# Patient Record
Sex: Male | Born: 1955 | Race: Black or African American | Hispanic: No | Smoking: Former smoker
Health system: Southern US, Community
[De-identification: ages and names within clinical notes are randomized; demographics above are authoritative.]

## PROBLEM LIST (undated history)

## (undated) DIAGNOSIS — D869 Sarcoidosis, unspecified: Secondary | ICD-10-CM

## (undated) DIAGNOSIS — G4730 Sleep apnea, unspecified: Secondary | ICD-10-CM

## (undated) DIAGNOSIS — K219 Gastro-esophageal reflux disease without esophagitis: Secondary | ICD-10-CM

## (undated) DIAGNOSIS — R0602 Shortness of breath: Secondary | ICD-10-CM

## (undated) DIAGNOSIS — N390 Urinary tract infection, site not specified: Secondary | ICD-10-CM

## (undated) DIAGNOSIS — F32A Depression, unspecified: Secondary | ICD-10-CM

## (undated) DIAGNOSIS — Z933 Colostomy status: Secondary | ICD-10-CM

## (undated) DIAGNOSIS — I10 Essential (primary) hypertension: Secondary | ICD-10-CM

## (undated) DIAGNOSIS — N200 Calculus of kidney: Secondary | ICD-10-CM

## (undated) DIAGNOSIS — F329 Major depressive disorder, single episode, unspecified: Secondary | ICD-10-CM

## (undated) DIAGNOSIS — E785 Hyperlipidemia, unspecified: Secondary | ICD-10-CM

## (undated) DIAGNOSIS — N319 Neuromuscular dysfunction of bladder, unspecified: Secondary | ICD-10-CM

## (undated) HISTORY — DX: Urinary tract infection, site not specified: N39.0

## (undated) HISTORY — PX: SPINE SURGERY: SHX786

## (undated) HISTORY — PX: COLOSTOMY: SHX63

---

## 1998-02-19 ENCOUNTER — Other Ambulatory Visit: Admission: RE | Admit: 1998-02-19 | Discharge: 1998-02-19 | Payer: Self-pay | Admitting: Family Medicine

## 1999-01-19 ENCOUNTER — Emergency Department (HOSPITAL_COMMUNITY): Admission: EM | Admit: 1999-01-19 | Discharge: 1999-01-19 | Payer: Self-pay | Admitting: Emergency Medicine

## 1999-11-22 ENCOUNTER — Encounter: Admission: RE | Admit: 1999-11-22 | Discharge: 1999-11-22 | Payer: Self-pay | Admitting: Gastroenterology

## 1999-11-22 ENCOUNTER — Encounter: Payer: Self-pay | Admitting: Gastroenterology

## 2000-02-12 ENCOUNTER — Ambulatory Visit (HOSPITAL_COMMUNITY): Admission: RE | Admit: 2000-02-12 | Discharge: 2000-02-12 | Payer: Self-pay | Admitting: *Deleted

## 2000-04-28 ENCOUNTER — Encounter: Payer: Self-pay | Admitting: Urology

## 2000-04-28 ENCOUNTER — Encounter: Admission: RE | Admit: 2000-04-28 | Discharge: 2000-04-28 | Payer: Self-pay | Admitting: Urology

## 2000-11-17 ENCOUNTER — Ambulatory Visit (HOSPITAL_COMMUNITY): Admission: RE | Admit: 2000-11-17 | Discharge: 2000-11-17 | Payer: Self-pay | Admitting: Gastroenterology

## 2001-03-16 ENCOUNTER — Encounter: Admission: RE | Admit: 2001-03-16 | Discharge: 2001-06-14 | Payer: Self-pay | Admitting: Orthopedic Surgery

## 2001-06-15 ENCOUNTER — Encounter: Admission: RE | Admit: 2001-06-15 | Discharge: 2001-09-06 | Payer: Self-pay | Admitting: Orthopedic Surgery

## 2001-11-27 ENCOUNTER — Encounter: Admission: RE | Admit: 2001-11-27 | Discharge: 2001-12-13 | Payer: Self-pay | Admitting: Internal Medicine

## 2001-11-30 ENCOUNTER — Emergency Department (HOSPITAL_COMMUNITY): Admission: EM | Admit: 2001-11-30 | Discharge: 2001-11-30 | Payer: Self-pay | Admitting: Emergency Medicine

## 2002-06-23 ENCOUNTER — Encounter (HOSPITAL_BASED_OUTPATIENT_CLINIC_OR_DEPARTMENT_OTHER): Admission: RE | Admit: 2002-06-23 | Discharge: 2002-06-30 | Payer: Self-pay | Admitting: Internal Medicine

## 2002-08-22 ENCOUNTER — Ambulatory Visit (HOSPITAL_COMMUNITY): Admission: RE | Admit: 2002-08-22 | Discharge: 2002-08-22 | Payer: Self-pay | Admitting: Orthopedic Surgery

## 2002-08-29 ENCOUNTER — Encounter: Admission: RE | Admit: 2002-08-29 | Discharge: 2002-08-29 | Payer: Self-pay | Admitting: Orthopedic Surgery

## 2002-08-29 ENCOUNTER — Encounter: Payer: Self-pay | Admitting: Orthopedic Surgery

## 2003-04-13 ENCOUNTER — Encounter: Payer: Self-pay | Admitting: Emergency Medicine

## 2003-04-13 ENCOUNTER — Emergency Department (HOSPITAL_COMMUNITY): Admission: EM | Admit: 2003-04-13 | Discharge: 2003-04-13 | Payer: Self-pay | Admitting: Emergency Medicine

## 2003-05-17 ENCOUNTER — Encounter: Payer: Self-pay | Admitting: Internal Medicine

## 2003-05-17 ENCOUNTER — Encounter: Admission: RE | Admit: 2003-05-17 | Discharge: 2003-05-17 | Payer: Self-pay | Admitting: Internal Medicine

## 2003-05-22 ENCOUNTER — Ambulatory Visit (HOSPITAL_COMMUNITY): Admission: RE | Admit: 2003-05-22 | Discharge: 2003-05-22 | Payer: Self-pay | Admitting: Internal Medicine

## 2003-05-22 ENCOUNTER — Encounter: Payer: Self-pay | Admitting: Internal Medicine

## 2003-07-06 ENCOUNTER — Encounter: Payer: Self-pay | Admitting: Orthopedic Surgery

## 2003-07-07 ENCOUNTER — Inpatient Hospital Stay (HOSPITAL_COMMUNITY): Admission: RE | Admit: 2003-07-07 | Discharge: 2003-07-08 | Payer: Self-pay | Admitting: Orthopedic Surgery

## 2003-07-18 ENCOUNTER — Encounter: Admission: RE | Admit: 2003-07-18 | Discharge: 2003-08-22 | Payer: Self-pay | Admitting: Orthopedic Surgery

## 2003-10-24 ENCOUNTER — Encounter: Admission: RE | Admit: 2003-10-24 | Discharge: 2003-10-24 | Payer: Self-pay | Admitting: Orthopedic Surgery

## 2003-11-14 ENCOUNTER — Encounter: Admission: RE | Admit: 2003-11-14 | Discharge: 2004-01-03 | Payer: Self-pay | Admitting: Orthopedic Surgery

## 2004-12-30 ENCOUNTER — Ambulatory Visit: Payer: Self-pay | Admitting: Family Medicine

## 2005-01-07 ENCOUNTER — Ambulatory Visit: Payer: Self-pay | Admitting: Family Medicine

## 2005-02-07 ENCOUNTER — Emergency Department (HOSPITAL_COMMUNITY): Admission: EM | Admit: 2005-02-07 | Discharge: 2005-02-08 | Payer: Self-pay | Admitting: Emergency Medicine

## 2005-02-11 ENCOUNTER — Ambulatory Visit: Payer: Self-pay | Admitting: Family Medicine

## 2005-02-20 ENCOUNTER — Ambulatory Visit (HOSPITAL_COMMUNITY): Admission: RE | Admit: 2005-02-20 | Discharge: 2005-02-20 | Payer: Self-pay | Admitting: Urology

## 2005-02-20 ENCOUNTER — Ambulatory Visit (HOSPITAL_BASED_OUTPATIENT_CLINIC_OR_DEPARTMENT_OTHER): Admission: RE | Admit: 2005-02-20 | Discharge: 2005-02-20 | Payer: Self-pay | Admitting: Urology

## 2005-03-05 ENCOUNTER — Encounter: Admission: RE | Admit: 2005-03-05 | Discharge: 2005-03-05 | Payer: Self-pay | Admitting: Sports Medicine

## 2005-03-20 ENCOUNTER — Ambulatory Visit: Payer: Self-pay | Admitting: Family Medicine

## 2005-10-20 ENCOUNTER — Ambulatory Visit: Payer: Self-pay | Admitting: Family Medicine

## 2005-11-06 ENCOUNTER — Ambulatory Visit: Payer: Self-pay | Admitting: Family Medicine

## 2005-11-11 ENCOUNTER — Ambulatory Visit: Payer: Self-pay | Admitting: Internal Medicine

## 2006-01-06 ENCOUNTER — Ambulatory Visit: Payer: Self-pay | Admitting: Family Medicine

## 2006-01-08 ENCOUNTER — Ambulatory Visit: Payer: Self-pay | Admitting: Family Medicine

## 2006-01-20 ENCOUNTER — Ambulatory Visit (HOSPITAL_COMMUNITY): Admission: RE | Admit: 2006-01-20 | Discharge: 2006-01-20 | Payer: Self-pay | Admitting: Internal Medicine

## 2006-02-02 ENCOUNTER — Emergency Department (HOSPITAL_COMMUNITY): Admission: EM | Admit: 2006-02-02 | Discharge: 2006-02-02 | Payer: Self-pay | Admitting: Emergency Medicine

## 2006-04-10 ENCOUNTER — Ambulatory Visit: Payer: Self-pay | Admitting: Internal Medicine

## 2007-04-12 ENCOUNTER — Ambulatory Visit: Payer: Self-pay | Admitting: Internal Medicine

## 2007-04-26 ENCOUNTER — Emergency Department (HOSPITAL_COMMUNITY): Admission: EM | Admit: 2007-04-26 | Discharge: 2007-04-26 | Payer: Self-pay | Admitting: Emergency Medicine

## 2007-05-10 ENCOUNTER — Ambulatory Visit (HOSPITAL_COMMUNITY): Admission: RE | Admit: 2007-05-10 | Discharge: 2007-05-10 | Payer: Self-pay | Admitting: Internal Medicine

## 2007-05-10 ENCOUNTER — Ambulatory Visit: Payer: Self-pay | Admitting: Internal Medicine

## 2007-06-29 ENCOUNTER — Ambulatory Visit: Payer: Self-pay | Admitting: Internal Medicine

## 2007-06-29 LAB — CONVERTED CEMR LAB
ALT: 29 units/L (ref 0–53)
AST: 20 units/L (ref 0–37)
BUN: 28 mg/dL — ABNORMAL HIGH (ref 6–23)
CO2: 20 meq/L (ref 19–32)
Cholesterol: 202 mg/dL — ABNORMAL HIGH (ref 0–200)
Creatinine, Ser: 1.16 mg/dL (ref 0.40–1.50)
HDL: 38 mg/dL — ABNORMAL LOW (ref >39)
Sodium: 140 meq/L (ref 135–145)
Total Bilirubin: 0.6 mg/dL (ref 0.3–1.2)

## 2007-07-14 ENCOUNTER — Encounter: Admission: RE | Admit: 2007-07-14 | Discharge: 2007-07-14 | Payer: Self-pay | Admitting: Orthopedic Surgery

## 2008-11-03 HISTORY — PX: TOE AMPUTATION: SHX809

## 2009-04-23 ENCOUNTER — Encounter: Admission: RE | Admit: 2009-04-23 | Discharge: 2009-04-23 | Payer: Self-pay | Admitting: Family Medicine

## 2009-08-25 ENCOUNTER — Emergency Department (HOSPITAL_COMMUNITY): Admission: EM | Admit: 2009-08-25 | Discharge: 2009-08-25 | Payer: Self-pay | Admitting: Emergency Medicine

## 2009-09-24 ENCOUNTER — Emergency Department (HOSPITAL_COMMUNITY): Admission: EM | Admit: 2009-09-24 | Discharge: 2009-09-24 | Payer: Self-pay | Admitting: Emergency Medicine

## 2009-12-07 ENCOUNTER — Ambulatory Visit (HOSPITAL_BASED_OUTPATIENT_CLINIC_OR_DEPARTMENT_OTHER): Admission: RE | Admit: 2009-12-07 | Discharge: 2009-12-07 | Payer: Self-pay

## 2010-05-23 ENCOUNTER — Encounter: Admission: RE | Admit: 2010-05-23 | Discharge: 2010-05-23 | Payer: Self-pay | Admitting: Orthopedic Surgery

## 2010-06-28 ENCOUNTER — Inpatient Hospital Stay (HOSPITAL_COMMUNITY): Admission: RE | Admit: 2010-06-28 | Discharge: 2010-06-29 | Payer: Self-pay | Admitting: Orthopedic Surgery

## 2010-08-01 ENCOUNTER — Encounter
Admission: RE | Admit: 2010-08-01 | Discharge: 2010-09-11 | Payer: Self-pay | Source: Home / Self Care | Admitting: Orthopedic Surgery

## 2010-08-10 ENCOUNTER — Emergency Department (HOSPITAL_COMMUNITY): Admission: EM | Admit: 2010-08-10 | Discharge: 2010-08-10 | Payer: Self-pay | Admitting: Emergency Medicine

## 2011-01-16 LAB — COMPREHENSIVE METABOLIC PANEL
Albumin: 3.2 g/dL — ABNORMAL LOW (ref 3.5–5.2)
Calcium: 9.1 mg/dL (ref 8.4–10.5)
Chloride: 108 mEq/L (ref 96–112)
Creatinine, Ser: 1.01 mg/dL (ref 0.4–1.5)
GFR calc Af Amer: >60 mL/min (ref >60)
Glucose, Bld: 97 mg/dL (ref 70–99)
Potassium: 4.1 mEq/L (ref 3.5–5.1)
Sodium: 139 mEq/L (ref 135–145)
Total Protein: 6.5 g/dL (ref 6.0–8.3)

## 2011-01-16 LAB — CBC
MCV: 92.4 fL (ref 78.0–100.0)
Platelets: 146 10*3/uL — ABNORMAL LOW (ref 150–400)
WBC: 4.4 10*3/uL (ref 4.0–10.5)

## 2011-01-21 NOTE — Discharge Summary (Signed)
  [name], [name]  [name]          PATIENT TYPE:  LOCATION:                                 FACILITY:  PHYSICIAN:  Patrick Orozco, M.D.    DATE OF BIRTH:  1956/08/06  DATE OF ADMISSION:  06/28/2010 DATE OF DISCHARGE:  06/29/2010                              DISCHARGE SUMMARY   DISCHARGE DIAGNOSIS:  Partial rotator cuff tear.  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  Arthroscopic debridement of partial rotator cuff tear and debridement of intraarticular glenohumeral arthritis and lysis of adhesions.  DISCHARGE SUMMARY:  The patient is a 55 year old with a history of intractable right shoulder pain, possible rotator cuff tear that required surgical intervention.  Because of medical history, the patient was thought best served to be operated on at the [facility] here at [facility].  Preoperative history and physical showed the patient to be in relatively good health with a history of contrast dye, hypertension and asthma.  Preoperative labs were within normal limits with a hemoglobin of 14, hematocrit of 41 and he was found to be a good candidate for surgery.  On the date of his admission, the patient was taken to the operating room where he underwent right shoulder arthroscopic debridement of intraarticular glenohumeral arthritis, lysis of adhesions, and inspection of partial rotator cuff tear.  The patient was placed on perioperative antibiotics.  He was placed on postoperative pain control using both oral and IM medication.  He was admitted for overnight observation to monitor his oxygenation as he has had some difficulties with keeping his saturations up while in PACU.  He was also prescribed the medications as needed for nausea and physical therapy was begun to teach him range of motion, pendulum-type exercises.  On the following  morning, the patient was found to be progressing well, had passed his physical therapy instruction.  He had no complaints and he had good saturation levels on his oxygenation by monitor.  He was able to transfer independently in to his power wheelchair and felt he was ready to go home.  He was discharged home under the care of his family.  DISCHARGE MEDICATIONS:  He will continue his ProAir albuterol inhaler, his Toprol-XL 50 mg tablets, his Zocor tablets and was given prescriptions for Percocet 1-2 q.4 h. p.r.n. pain, Robaxin 500 mg 1 p.o. q.8. p.r.n. spasm, and Mobic 7.5 mg one p.o. daily.  ACTIVITIES:  At the time of discharge, continue with range of motion exercises for the right shoulder, discontinue use of sling after 1-2 additional days.  He will return to see Dr. Madelon Lips in one week's time, calling the office sooner if he has temperature increased to greater than 101 degrees.  He should keep his dressing dry and change as necessary, and keeping the incision covered.     Patrick Orozco. Patrick Orozco   ______________________________ Patrick Orozco, M.D.    JBR/MEDQ  D:  01/07/2011  T:  01/07/2011  Job:  [id number]  Electronically Signed by W. Jiyah Torpey M.D. on 01/21/2011 11:10:18 AM

## 2011-01-22 LAB — BASIC METABOLIC PANEL
CO2: 24 mEq/L (ref 19–32)
Chloride: 107 mEq/L (ref 96–112)
GFR calc Af Amer: >60 mL/min (ref >60)
GFR calc non Af Amer: >60 mL/min (ref >60)
Glucose, Bld: 118 mg/dL — ABNORMAL HIGH (ref 70–99)
Potassium: 4.4 mEq/L (ref 3.5–5.1)
Sodium: 138 mEq/L (ref 135–145)

## 2011-02-22 ENCOUNTER — Emergency Department (HOSPITAL_COMMUNITY): Payer: Medicare Other

## 2011-02-22 ENCOUNTER — Emergency Department (HOSPITAL_COMMUNITY)
Admission: EM | Admit: 2011-02-22 | Discharge: 2011-02-22 | Disposition: A | Discharge status: Home / Self Care | Payer: Medicare Other | Attending: Emergency Medicine | Admitting: Emergency Medicine

## 2011-02-22 DIAGNOSIS — E7800 Pure hypercholesterolemia, unspecified: Secondary | ICD-10-CM | POA: Insufficient documentation

## 2011-02-22 DIAGNOSIS — Z79899 Other long term (current) drug therapy: Secondary | ICD-10-CM | POA: Insufficient documentation

## 2011-02-22 DIAGNOSIS — Q059 Spina bifida, unspecified: Secondary | ICD-10-CM | POA: Insufficient documentation

## 2011-02-22 DIAGNOSIS — Y92009 Unspecified place in unspecified non-institutional (private) residence as the place of occurrence of the external cause: Secondary | ICD-10-CM | POA: Insufficient documentation

## 2011-02-22 DIAGNOSIS — Y93E1 Activity, personal bathing and showering: Secondary | ICD-10-CM | POA: Insufficient documentation

## 2011-02-22 DIAGNOSIS — I10 Essential (primary) hypertension: Secondary | ICD-10-CM | POA: Insufficient documentation

## 2011-02-22 DIAGNOSIS — S92919A Unspecified fracture of unspecified toe(s), initial encounter for closed fracture: Secondary | ICD-10-CM | POA: Insufficient documentation

## 2011-02-22 DIAGNOSIS — E669 Obesity, unspecified: Secondary | ICD-10-CM | POA: Insufficient documentation

## 2011-02-22 DIAGNOSIS — W268XXA Contact with other sharp object(s), not elsewhere classified, initial encounter: Secondary | ICD-10-CM | POA: Insufficient documentation

## 2011-02-22 DIAGNOSIS — G8220 Paraplegia, unspecified: Secondary | ICD-10-CM | POA: Insufficient documentation

## 2011-02-22 DIAGNOSIS — S91109A Unspecified open wound of unspecified toe(s) without damage to nail, initial encounter: Secondary | ICD-10-CM | POA: Insufficient documentation

## 2011-02-22 DIAGNOSIS — Z9889 Other specified postprocedural states: Secondary | ICD-10-CM | POA: Insufficient documentation

## 2011-03-21 NOTE — Op Note (Signed)
   NAME:  NELTON, AMSDEN NO.:  [id number]   MEDICAL RECORD NO.:  [id number]                   PATIENT TYPE:  INP   LOCATION:  5024                                 FACILITY:  [facility]   PHYSICIAN:  Thera Flake., M.D.             DATE OF BIRTH:  06-02-1956   DATE OF PROCEDURE:  07/07/2003  DATE OF DISCHARGE:                                 OPERATIVE REPORT   PREOPERATIVE DIAGNOSES:  1. Impingement with bursitis, partial cuff tear.  2. Labral tear.   POSTOPERATIVE DIAGNOSES:  1. Impingement with bursitis, partial cuff tear.  2. Labral tera.   PROCEDURE:  1. Acromioplasty with rotator cuff debridement.  2. Debridement of torn labrum.   SURGEON:  Dyke Brackett, M.D.   ANESTHESIA:  General.   INDICATIONS:  This is a 55 year old paraplegic with recurrent impingement,  partial cuff tears, having had several injections of the right shoulder not  responding to conservative treatment, thought to be amenable to outpatient  arthroscopy.   DESCRIPTION OF PROCEDURE:  Arthroscope through a posterior lateral and  anterior portal.  Systematic inspection of the shoulder showed the patient  to have no degenerative change, really in the joint, but degenerative tear  in the anterior and inferior superior labrum.  Biceps anchor tendon intact.  Aggressive debridement of the labrum was carried out.  Partial cuff tear,  probably 30-40% of the thickness of the rotator cuff supraspinatus over its  anterior one-half which was debrided.  Nothing approaching.  A full-  thickness tear was noted.  Debridement of the intra-articular labral tear  was carried out separate.  The bursae itself despite having previously had  resection years ago showed an extreme amount of hypertrophy, some thick  bands, scarred in, hypertrophic bursae, aggressive bursectomy carried out a  revision and re-acromioplasty done to basically bump the bone although there  was no significant  overhanging bone.  The bone was freshened with the bur.  Complete bursectomies carried out.  Very impressive the amount of bursal  hypertrophy and scarring in the bursae, and again this was completely  removed relieving the impingement and the bursitis nicely.  Superior surface  of the cuff showed nothing approaching the full thickness tear.  The [facility]  joint was not prominently involved in any disease process.  It was not  violated.  The shoulder drained free of fluid.  Portals closed with lightly  compressive sterile dressing and sling applied. Taken to the recovery room  in stable condition.                                               Thera Flake., M.D.    WDC/MEDQ  D:  07/07/2003  T:  07/08/2003  Job:  [id number]

## 2011-03-21 NOTE — H&P (Signed)
NAME:  Patrick Orozco, Patrick Orozco NO.:  [id number]   MEDICAL RECORD NO.:  [id number]                   PATIENT TYPE:  INP   LOCATION:  5024                                 FACILITY:  [facility]   PHYSICIAN:  Dyke Brackett, M.D.                 DATE OF BIRTH:  [birth year]   DATE OF ADMISSION:  07/07/2003  DATE OF DISCHARGE:                                HISTORY & PHYSICAL   CHIEF COMPLAINT:  Right shoulder pain.   HISTORY OF PRESENT ILLNESS:  This 55 year old paraplegic patient presented  to Dr. Madelon Lips with a history of recurrent impingement and partial rotator  cuff tears in the past on the right shoulder.  He had several injections of  cortisone in the right shoulder, and this has not relieved his pain.  Because of failure to respond to conservative treatment he is being admitted  for observation, status post right shoulder arthroscopy.   ALLERGIES:  IVP DYE.   CURRENT MEDICATIONS:  1. Toprol 1 tablet p.o. daily, last dose July 04, 2003.  2. Kaopectate p.r.n.   PAST MEDICAL HISTORY:  1. Spina bifida.  2. Paraplegia.  3. History of cardiac arrhythmia.  4. 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  5. Urinary incontinence, treated with condom catheter.   SURGICAL PROCEDURES:  1. Multiple surgeries on his foot, hips, and legs in the 1960s due to spina     bifida.  2. Left shoulder arthroscopy in 1998.  3. Right shoulder arthroscopy in 1993.   SOCIAL HISTORY:  He denies any drug use or alcohol use.  He quit smoking 10  years ago.  He lives with a friend at home.   FAMILY HISTORY:  Noncontributory.   REVIEW OF SYSTEMS:  He does have a history of cardiac arrhythmia.  Complains  of some bloating and gas and pain with every meal.  He does use a condom  catheter.  He has had the diarrhea since the Toprol.  He does have some  urinary incontinence at times.  He is a paraplegic.  He does wear glasses  always.  He does have difficulty sleeping because  of his right shoulder  pain.  He has snoring, and he wakes up and has to take some deep breaths at  times.   PHYSICAL EXAM:  GENERAL:  Well-developed, well-nourished male in no acute  distress.  He is a paraplegic.  VITAL SIGNS:  Temperature 97 degrees Fahrenheit, pulse 77, respirations 18,  and BP 154/107 in the right and 161/88 in the left.  HEENT:  Within normal limits.  NECK:  Supple.  Without masses or lesions.  CARDIOVASCULAR:  Heart rate and rhythm regular.  S1, S2 present.  RESPIRATORY:  Respirations even and unlabored.  Clear to auscultation  bilaterally.  ABDOMEN:  Colostomy in place.  Bowel sounds present.  GENITOURINARY:  Condom catheter in place.  MUSCULOSKELETAL:  He has good range  of motion of his left shoulder.  Complains of right shoulder aching in the biceps region.  He has decreased  range of motion of that shoulder.  Pain is worse with raising his arm up  over his head.  Arm otherwise neurovascularly intact.  Cannot move his lower  extremities.  NEUROLOGIC:  Alert and oriented x3.  Paraplegic with his lower extremities.   IMPRESSION:  1. Recurrent impingement syndrome right shoulder with probable partial cuff     tear.  2. Paraplegia.  3. Spina bifida.  4. Urinary incontinence.  5. Diarrhea due to medications with colostomy.  6. History of cardiac arrhythmia.   PLAN:  Mr. Lolli is being admitted for observation status post right  shoulder arthroscopy.  He will undergo all the routine preoperative  laboratory tests and studies prior to this procedure.      Legrand Pitts Marshall Cork, M.D.    KED/MEDQ  D:  07/28/2003  T:  07/29/2003  Job:  [id number]

## 2011-03-21 NOTE — Consult Note (Signed)
[facility]  Patient:    Patrick Orozco, Patrick Orozco                     MRN: [id number] Adm. Date:  [id number] Attending:  Nadara Mustard                          Consultation Report  PHYSICIAN REQUESTING CONSULTATION:  Dr. Stacie Acres. White at [employer] [facility] [employer].  CHIEF COMPLAINT:  Nonhealing left heel ulcer.  HISTORY OF PRESENT ILLNESS:  The patient is a 55 year old black male with several medical issues, who has noted a left heel ulcer for six to eight months.  The ulcer has gradually increased in size and has had intermittent bleeding.  He has not had fever or chills associated with the ulcer.  He has tried treating the ulcer with elevating his legs and using topical antibiotics without improvement.  PAST MEDICAL HISTORY:  His past medical history is significant for a spinal bifida resulting in bilateral numbness of the lower extremities from the knees down.  He uses knee/foot orthotics bilaterally and is able to ambulate somewhat with the help of crutches.  He also has a history of hypertension, gastroesophageal reflux disease, and hyperlipidemia.  PAST SURGICAL HISTORY:  Past surgical history is significant for several operations in the 1960s to correct foot deformities on both feet and in the 1960s, he had bilateral hip operations for uncertain reasons.  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  He has a history of colostomy.  MEDICATIONS: 1. Zocor 20 mg p.o. q.d. 2. Labetalol 200 mg p.o. b.i.d. 3. Nexium 40 mg p.o. q.d.  ALLERGIES:  No known drug allergies.  PHYSICAL EXAMINATION:  EXTREMITIES:  Both feet have well-healed surgical scars oriented medially and lengthwise on both feet and overlying the Achilles tendons.  There is moderate eversion of the left ankle.  Both feet have normal temperature. There is 1+ nonpitting edema of both feet and the pedal pulses  are nonpalpable; however, the pulses were easily obtained via Doppler ultrasound interrogation in both the dorsalis pedis and posterior tibial arteries bilaterally.  On the plantar and medial aspect of the left heel, there is an approximately 1.5 x 2.5-cm stage 1 neuropathic ulcer with a large amount of surrounding necrotic material and callus.  There is mild tinea pedis affecting multiple interspaces of both feet.  Both feet are insensate to light touch.  IMPRESSION: 1. Neuropathic ulcer, left foot, Wagner stage 1. 2. 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 3. Tinea pedis bilaterally. 4. Hypertension. 5. Gastroesophageal reflux disease. 6. Hyperlipidemia.  PLAN: 1. The left heel ulcer was debrided with a #10 scalpel without difficulty down    to viable tissue.  The left lower extremity was placed in a total contact    cast to reduce pressure on the ulcer. 2. Lamisil cream was applied between the toes of both feet and will be done    once daily between the toes of the right foot. 3. The patient will return in approximately one week for reevaluation of the    foot ulcer. 4. He was seen by a pedorthotist who plans on making custom extra-depth shoes    which his ulcer has healed. DD:  03/16/01 TD:  03/17/01 Job: [id number]

## 2011-03-21 NOTE — Procedures (Signed)
[facility]  Patient:    Patrick Orozco, Patrick Orozco                     MRN: [id number] Proc. Date: 11/17/00 Adm. Date:  [id number] Attending:  Orland Mustard CC:         Dr. Beverley Fiedler, [facility]             Lucrezia Starch. Ovidio Hanger, M.D.                           Procedure Report  DATE OF BIRTH:  [birth year].  PROCEDURE:  Esophagogastroduodenoscopy.  MEDICATIONS:  Cetacaine spray, fentanyl 75 mcg, Versed 6 mg IV.  INDICATIONS:  Recent hematemesis.  DESCRIPTION OF PROCEDURE:  The procedure had been explained to the patient and consent obtained.  With the patient in the left lateral decubitus position, the Olympus video endoscope was inserted blindly into the esophagus and advanced under direct visualization.  The stomach was entered, pylorus identified and passed.  The duodenum, including the bulb and the second portion, was seen well and the second portion was normal with no ulceration or inflammation.  There was no evidence of outlet obstruction or ulcers.  The pyloric channel was normal.  The antrum was seen well and was normal.  The greater curve did have a fairly large quantity of semisolid, gelatinous liquid in spite of the overnight fast.  This was irrigated and sucked clean and the body carefully examined, and there was no ulceration seen.  The fundus and cardia were seen in the retroflex view.  There was a hiatal hernia with a widely patent GE junction and markedly reddened distal esophagus.  The proximal esophagus was more endoscopically normal.  The scope was withdrawn. The patient tolerated the procedure well, was maintained on low-flow oxygen and pulse oximeter throughout the procedure with no obvious abnormalities.  ASSESSMENT: 1. Hiatal hernia with gastroesophageal reflux disease. 2. Probable gastroparesis.  PLAN:  Will continue Nexium, add Reglan to his medical regimen.  Will give the patient a reflux sheet and see back  in the office in two months. DD:  11/17/00 TD:  11/17/00 Job: [id number]

## 2011-03-21 NOTE — Op Note (Signed)
[name], [name] NO.:  [id number]   MEDICAL RECORD NO.:  [id number]          PATIENT TYPE:  AMB   LOCATION:  DAY                          FACILITY:  [facility]   PHYSICIAN:  Ronald L. Earlene Plater, M.D.  DATE OF BIRTH:  January 29, 1956   DATE OF PROCEDURE:  02/20/2005  DATE OF DISCHARGE:                                 OPERATIVE REPORT   DIAGNOSIS:  Left ureterolithiasis with hydronephrosis.   OPERATIVE PROCEDURE:  1.  Cystourethroscopy, left retrograde ureteropyelogram.  2.  Placement of left double-J stent.  3.  Preparation with ESWL.   SURGEON:  Gaynelle Arabian, M.D.   ANESTHESIA:  General endotracheal.   BLOOD LOSS:  Minimal.   TUBES:  A 26 cm 6 French Cook double-pigtail stent.   COMPLICATIONS:  None.   INDICATIONS FOR PROCEDURE:  Patrick Orozco is a very nice 55 year old black  male with paraplegia secondary to spina bifida.  He had presented to [facility] Emergency Room with left-sided flank pain.  He had been found to have  pneumonia and a kidney stone.  CT scan in the office revealed a left  hydronephrosis with an 11 mm obstructing stone at the left ureteropelvic  junction.  He also had a 17 mm stone in the lower pole of the left kidney.  After understanding risks, benefits, and alternatives, elected to proceed  with cysto, placement of left double-J stent, and left SWL.   PROCEDURE IN DETAIL:  The patient was placed in a supine position.  After  proper general endotracheal anesthesia, was placed in the dorsolithotomy  position and prepped and draped with Betadine in a sterile fashion after his  condom catheter had been removed.  Cystourethroscopy was performed with a  22.5 French Olympus panendoscope, utilizing the 12 and 70-degree lens.  There was no significant prostatic enlargement noted.  The bladder was  actually smooth wall.  Efflux of clear urine was noted from the area of the  right ureteral orifice, although it was hard to identify, but the left  ureteral orifice was easily identifiable.  A retrograde ureteral pyelogram  was performed with a 6 [place] open-ended catheter, and there was noted to be  a highly obstructed 1.1 cm stone at the level of L3 on the left with a  proximal hydronephrosis.  Under fluoroscopic guidance, a 0.038 [place]  Glidewire was placed past the stone, and a 6 [place] open-ended catheter was  placed into the kidney, and a hydronephrotic drip was noted.  The wire was  changed to a 0.038 [place] Teflon-coated guidewire and under fluoroscopic  guidance, a 26  cm 6 French Cook double-pigtail stent was placed and noted to be in good  position within the left renal pelvis within the bladder.  The patient  tolerated the procedure well with no complications.  He was taken to the  recovery room stable.      RLD/MEDQ  D:  02/20/2005  T:  02/20/2005  Job:  [id number]

## 2011-08-20 LAB — CBC
HCT: 47.8
MCV: 90.4
RBC: 5.29

## 2011-08-20 LAB — COMPREHENSIVE METABOLIC PANEL
ALT: 25
BUN: 16
Calcium: 9.2
Creatinine, Ser: 1.18
GFR calc Af Amer: >60
GFR calc non Af Amer: >60
Glucose, Bld: 101 — ABNORMAL HIGH
Total Bilirubin: 0.5
Total Protein: 7.9

## 2011-08-20 LAB — DIFFERENTIAL
Basophils Relative: 0
Eosinophils Absolute: 0.2
Monocytes Absolute: 0.6
Monocytes Relative: 8

## 2011-08-20 LAB — CULTURE, BLOOD (ROUTINE X 2)

## 2011-08-20 LAB — B-NATRIURETIC PEPTIDE (CONVERTED LAB): Pro B Natriuretic peptide (BNP): <30

## 2011-09-27 ENCOUNTER — Encounter: Payer: Self-pay | Admitting: *Deleted

## 2011-09-27 ENCOUNTER — Emergency Department (HOSPITAL_COMMUNITY)
Admission: EM | Admit: 2011-09-27 | Discharge: 2011-09-27 | Disposition: A | Discharge status: Home / Self Care | Payer: Medicare Other | Attending: Emergency Medicine | Admitting: Emergency Medicine

## 2011-09-27 DIAGNOSIS — J209 Acute bronchitis, unspecified: Secondary | ICD-10-CM | POA: Insufficient documentation

## 2011-09-27 DIAGNOSIS — Q059 Spina bifida, unspecified: Secondary | ICD-10-CM | POA: Insufficient documentation

## 2011-09-27 DIAGNOSIS — R062 Wheezing: Secondary | ICD-10-CM | POA: Insufficient documentation

## 2011-09-27 DIAGNOSIS — R079 Chest pain, unspecified: Secondary | ICD-10-CM | POA: Insufficient documentation

## 2011-09-27 DIAGNOSIS — Z9889 Other specified postprocedural states: Secondary | ICD-10-CM | POA: Insufficient documentation

## 2011-09-27 MED ORDER — IPRATROPIUM BROMIDE 0.02 % IN SOLN
0.5000 mg | Freq: Once | RESPIRATORY_TRACT | Status: AC
  Administered 2011-09-27: 0.5 mg via RESPIRATORY_TRACT
  Filled 2011-09-27: qty 2.5

## 2011-09-27 MED ORDER — ALBUTEROL SULFATE HFA 108 (90 BASE) MCG/ACT IN AERS
INHALATION_SPRAY | RESPIRATORY_TRACT | Status: AC
  Filled 2011-09-27: qty 6.7

## 2011-09-27 MED ORDER — PREDNISONE 20 MG PO TABS
60.0000 mg | ORAL_TABLET | Freq: Once | ORAL | Status: AC
  Administered 2011-09-27: 60 mg via ORAL
  Filled 2011-09-27: qty 3

## 2011-09-27 MED ORDER — AZITHROMYCIN 250 MG PO TABS
500.0000 mg | ORAL_TABLET | Freq: Once | ORAL | Status: AC
  Administered 2011-09-27: 500 mg via ORAL
  Filled 2011-09-27: qty 2

## 2011-09-27 MED ORDER — PREDNISONE 20 MG PO TABS: 60.0000 mg | ORAL_TABLET | Freq: Every day | ORAL | Status: AC

## 2011-09-27 MED ORDER — AZITHROMYCIN 250 MG PO TABS: 250.0000 mg | ORAL_TABLET | Freq: Every day | ORAL | Status: AC

## 2011-09-27 MED ORDER — ALBUTEROL SULFATE (5 MG/ML) 0.5% IN NEBU
5.0000 mg | INHALATION_SOLUTION | Freq: Once | RESPIRATORY_TRACT | Status: AC
  Administered 2011-09-27: 5 mg via RESPIRATORY_TRACT
  Filled 2011-09-27: qty 1

## 2011-09-27 MED ORDER — ALBUTEROL SULFATE HFA 108 (90 BASE) MCG/ACT IN AERS
2.0000 | INHALATION_SPRAY | Freq: Once | RESPIRATORY_TRACT | Status: AC
  Administered 2011-09-27: 2 via RESPIRATORY_TRACT
  Filled 2011-09-27: qty 6.7

## 2011-09-27 NOTE — ED Provider Notes (Signed)
Medical screening examination/treatment/procedure(s) were performed by non-physician practitioner and as supervising physician I was immediately available for consultation/collaboration.   Geoffery Lyons, MD 09/27/11 1538

## 2011-09-27 NOTE — ED Provider Notes (Signed)
History     CSN: [id number] Arrival date & time: 09/27/2011 10:24 AM   First MD Initiated Contact with Patient 09/27/11 1052      Chief Complaint  Patient presents with  . URI    (Consider location/radiation/quality/duration/timing/severity/associated sxs/prior treatment) Patient is a 55 y.o. male presenting with URI. The history is provided by the patient.  URI The primary symptoms include cough and wheezing. Primary symptoms do not include fever, sore throat, nausea or vomiting. The current episode started 3 to 5 days ago. This is a new problem. The problem has been gradually improving.  Symptoms associated with the illness include sinus pressure, congestion and rhinorrhea. The illness is not associated with chills.    Past Medical History  Diagnosis Date  . Spina bifida     Past Surgical History  Procedure Date  . Spine surgery     multiple surgeries related to spina bifida    No family history on file.  History  Substance Use Topics  . Smoking status: Never Smoker   . Smokeless tobacco: Not on file  . Alcohol Use: No      Review of Systems  Constitutional: Negative for fever and chills.  HENT: Positive for congestion, rhinorrhea and sinus pressure. Negative for sore throat.   Eyes: Negative.   Respiratory: Positive for cough and wheezing. Negative for shortness of breath.   Cardiovascular: Negative.        Chest pain with cough.  Gastrointestinal: Negative.  Negative for nausea and vomiting.  Musculoskeletal: Negative.   Skin: Negative.   Neurological: Negative.     Allergies  Ivp dye  Home Medications  No current outpatient prescriptions on file.  BP 135/74  Pulse 72  Temp(Src) 98.1 F (36.7 C) (Oral)  Resp 18  SpO2 97%  Physical Exam  Constitutional: He appears well-developed and well-nourished.  HENT:  Head: Normocephalic.  Mouth/Throat: Oropharynx is clear and moist.  Neck: Normal range of motion. Neck supple.  Cardiovascular: Normal  rate and regular rhythm.   Pulmonary/Chest: Effort normal. No respiratory distress. He has wheezes. He has no rales. He exhibits no tenderness.  Abdominal: Soft. Bowel sounds are normal. There is no tenderness. There is no rebound and no guarding.  Musculoskeletal: Normal range of motion.  Neurological: He is alert. No cranial nerve deficit.  Skin: Skin is warm and dry. No rash noted.  Psychiatric: He has a normal mood and affect.    ED Course  Procedures (including critical care time)  Labs Reviewed - No data to display No results found.   No diagnosis found.    MDM  He feel improved after nebulizer treatment. He reports feeling comfortable with discharge.         Rodena Medin, PA 09/27/11 1317

## 2011-09-27 NOTE — ED Notes (Signed)
Chest congestion and cold symptoms for weeks, feels like he is getting worse

## 2013-01-05 ENCOUNTER — Inpatient Hospital Stay (HOSPITAL_COMMUNITY)
Admission: EM | Admit: 2013-01-05 | Discharge: 2013-01-11 | DRG: 872 | Disposition: A | Discharge status: Home / Self Care | Payer: Medicare Other | Attending: Internal Medicine | Admitting: Internal Medicine

## 2013-01-05 ENCOUNTER — Emergency Department (HOSPITAL_COMMUNITY): Payer: Medicare Other

## 2013-01-05 ENCOUNTER — Encounter (HOSPITAL_COMMUNITY): Payer: Self-pay

## 2013-01-05 DIAGNOSIS — R0789 Other chest pain: Secondary | ICD-10-CM | POA: Diagnosis present

## 2013-01-05 DIAGNOSIS — D869 Sarcoidosis, unspecified: Secondary | ICD-10-CM | POA: Diagnosis present

## 2013-01-05 DIAGNOSIS — R748 Abnormal levels of other serum enzymes: Secondary | ICD-10-CM | POA: Diagnosis present

## 2013-01-05 DIAGNOSIS — Q059 Spina bifida, unspecified: Secondary | ICD-10-CM

## 2013-01-05 DIAGNOSIS — Z87728 Personal history of other specified (corrected) congenital malformations of nervous system and sense organs: Secondary | ICD-10-CM

## 2013-01-05 DIAGNOSIS — Z933 Colostomy status: Secondary | ICD-10-CM

## 2013-01-05 DIAGNOSIS — N200 Calculus of kidney: Secondary | ICD-10-CM | POA: Diagnosis present

## 2013-01-05 DIAGNOSIS — Z8669 Personal history of other diseases of the nervous system and sense organs: Secondary | ICD-10-CM

## 2013-01-05 DIAGNOSIS — E785 Hyperlipidemia, unspecified: Secondary | ICD-10-CM | POA: Diagnosis present

## 2013-01-05 DIAGNOSIS — I872 Venous insufficiency (chronic) (peripheral): Secondary | ICD-10-CM | POA: Diagnosis present

## 2013-01-05 DIAGNOSIS — R222 Localized swelling, mass and lump, trunk: Secondary | ICD-10-CM | POA: Diagnosis present

## 2013-01-05 DIAGNOSIS — A419 Sepsis, unspecified organism: Principal | ICD-10-CM | POA: Diagnosis present

## 2013-01-05 DIAGNOSIS — G8220 Paraplegia, unspecified: Secondary | ICD-10-CM | POA: Diagnosis present

## 2013-01-05 DIAGNOSIS — D72829 Elevated white blood cell count, unspecified: Secondary | ICD-10-CM | POA: Diagnosis present

## 2013-01-05 DIAGNOSIS — N390 Urinary tract infection, site not specified: Secondary | ICD-10-CM

## 2013-01-05 DIAGNOSIS — D696 Thrombocytopenia, unspecified: Secondary | ICD-10-CM | POA: Diagnosis present

## 2013-01-05 DIAGNOSIS — I2489 Other forms of acute ischemic heart disease: Secondary | ICD-10-CM | POA: Diagnosis present

## 2013-01-05 DIAGNOSIS — A499 Bacterial infection, unspecified: Secondary | ICD-10-CM | POA: Diagnosis present

## 2013-01-05 DIAGNOSIS — N10 Acute tubulo-interstitial nephritis: Secondary | ICD-10-CM | POA: Diagnosis present

## 2013-01-05 DIAGNOSIS — A498 Other bacterial infections of unspecified site: Secondary | ICD-10-CM | POA: Diagnosis present

## 2013-01-05 DIAGNOSIS — I10 Essential (primary) hypertension: Secondary | ICD-10-CM | POA: Diagnosis present

## 2013-01-05 DIAGNOSIS — I959 Hypotension, unspecified: Secondary | ICD-10-CM

## 2013-01-05 DIAGNOSIS — E7800 Pure hypercholesterolemia, unspecified: Secondary | ICD-10-CM | POA: Diagnosis present

## 2013-01-05 DIAGNOSIS — R531 Weakness: Secondary | ICD-10-CM

## 2013-01-05 HISTORY — DX: Calculus of kidney: N20.0

## 2013-01-05 HISTORY — DX: Hyperlipidemia, unspecified: E78.5

## 2013-01-05 HISTORY — DX: Essential (primary) hypertension: I10

## 2013-01-05 LAB — CBC WITH DIFFERENTIAL/PLATELET
Basophils Absolute: 0 10*3/uL (ref 0.0–0.1)
Basophils Relative: 0 % (ref 0–1)
Eosinophils Absolute: 0 10*3/uL (ref 0.0–0.7)
Eosinophils Relative: 0 % (ref 0–5)
HCT: 44.2 % (ref 39.0–52.0)
Hemoglobin: 15 g/dL (ref 13.0–17.0)
Lymphocytes Relative: 3 % — ABNORMAL LOW (ref 12–46)
Lymphs Abs: 0.3 10*3/uL — ABNORMAL LOW (ref 0.7–4.0)
MCH: 30.7 pg (ref 26.0–34.0)
MCHC: 33.9 g/dL (ref 30.0–36.0)
MCV: 90.4 fL (ref 78.0–100.0)
Monocytes Absolute: 0.1 10*3/uL (ref 0.1–1.0)
Monocytes Relative: 1 % — ABNORMAL LOW (ref 3–12)
Neutro Abs: 8.1 10*3/uL — ABNORMAL HIGH (ref 1.7–7.7)
Neutrophils Relative %: 95 % — ABNORMAL HIGH (ref 43–77)
Platelets: 125 10*3/uL — ABNORMAL LOW (ref 150–400)
RBC: 4.89 MIL/uL (ref 4.22–5.81)
RDW: 13.8 % (ref 11.5–15.5)
WBC: 8.5 10*3/uL (ref 4.0–10.5)

## 2013-01-05 LAB — BASIC METABOLIC PANEL
BUN: 20 mg/dL (ref 6–23)
CO2: 17 mEq/L — ABNORMAL LOW (ref 19–32)
Calcium: 9 mg/dL (ref 8.4–10.5)
Chloride: 108 mEq/L (ref 96–112)
Creatinine, Ser: 1.17 mg/dL (ref 0.50–1.35)
GFR calc Af Amer: 78 mL/min — ABNORMAL LOW (ref >90)
GFR calc non Af Amer: 68 mL/min — ABNORMAL LOW (ref >90)
Glucose, Bld: 106 mg/dL — ABNORMAL HIGH (ref 70–99)
Potassium: 4.1 mEq/L (ref 3.5–5.1)
Sodium: 141 mEq/L (ref 135–145)

## 2013-01-05 LAB — URINE MICROSCOPIC-ADD ON

## 2013-01-05 LAB — URINALYSIS, ROUTINE W REFLEX MICROSCOPIC
Bilirubin Urine: NEGATIVE
Glucose, UA: NEGATIVE mg/dL
Ketones, ur: NEGATIVE mg/dL
Nitrite: NEGATIVE
Protein, ur: NEGATIVE mg/dL
Specific Gravity, Urine: 1.01 (ref 1.005–1.030)
Urobilinogen, UA: 0.2 mg/dL (ref 0.0–1.0)
pH: 5.5 (ref 5.0–8.0)

## 2013-01-05 MED ORDER — SIMVASTATIN 40 MG PO TABS
40.0000 mg | ORAL_TABLET | Freq: Every day | ORAL | Status: DC
  Administered 2013-01-05 – 2013-01-10 (×6): 40 mg via ORAL
  Filled 2013-01-05 (×9): qty 1

## 2013-01-05 MED ORDER — ACETAMINOPHEN 325 MG PO TABS
650.0000 mg | ORAL_TABLET | Freq: Once | ORAL | Status: AC
  Administered 2013-01-05: 650 mg via ORAL

## 2013-01-05 MED ORDER — ACETAMINOPHEN 325 MG PO TABS
650.0000 mg | ORAL_TABLET | Freq: Four times a day (QID) | ORAL | Status: DC | PRN
  Administered 2013-01-07 (×2): 650 mg via ORAL
  Filled 2013-01-05 (×2): qty 2

## 2013-01-05 MED ORDER — ONDANSETRON HCL 4 MG PO TABS: 4.0000 mg | ORAL_TABLET | Freq: Four times a day (QID) | ORAL | Status: DC | PRN

## 2013-01-05 MED ORDER — CEFTRIAXONE SODIUM 1 G IJ SOLR
1.0000 g | Freq: Once | INTRAMUSCULAR | Status: AC
  Administered 2013-01-05: 1 g via INTRAVENOUS
  Filled 2013-01-05: qty 10

## 2013-01-05 MED ORDER — CEFTRIAXONE SODIUM 1 G IJ SOLR
1.0000 g | INTRAMUSCULAR | Status: DC
  Administered 2013-01-06 – 2013-01-10 (×5): 1 g via INTRAVENOUS
  Filled 2013-01-05 (×8): qty 10

## 2013-01-05 MED ORDER — ONDANSETRON HCL 4 MG/2ML IJ SOLN
4.0000 mg | Freq: Four times a day (QID) | INTRAMUSCULAR | Status: DC | PRN
  Administered 2013-01-05 – 2013-01-10 (×2): 4 mg via INTRAVENOUS
  Filled 2013-01-05 (×2): qty 2

## 2013-01-05 MED ORDER — ACETAMINOPHEN 650 MG RE SUPP: 650.0000 mg | Freq: Four times a day (QID) | RECTAL | Status: DC | PRN

## 2013-01-05 MED ORDER — SODIUM CHLORIDE 0.9 % IV SOLN
INTRAVENOUS | Status: DC
  Administered 2013-01-05 – 2013-01-08 (×7): via INTRAVENOUS
  Administered 2013-01-08: 1000 mL via INTRAVENOUS
  Administered 2013-01-08 – 2013-01-11 (×6): via INTRAVENOUS

## 2013-01-05 MED ORDER — METOPROLOL SUCCINATE ER 50 MG PO TB24
50.0000 mg | ORAL_TABLET | Freq: Every day | ORAL | Status: DC
  Filled 2013-01-05: qty 1

## 2013-01-05 MED ORDER — ACETAMINOPHEN 325 MG PO TABS
ORAL_TABLET | ORAL | Status: AC
  Administered 2013-01-05: 650 mg via ORAL
  Filled 2013-01-05: qty 2

## 2013-01-05 MED ORDER — MORPHINE SULFATE 2 MG/ML IJ SOLN
2.0000 mg | INTRAMUSCULAR | Status: DC | PRN
  Administered 2013-01-05: 2 mg via INTRAVENOUS
  Filled 2013-01-05 (×2): qty 1

## 2013-01-05 MED ORDER — SODIUM CHLORIDE 0.9 % IV BOLUS (SEPSIS)
1000.0000 mL | Freq: Once | INTRAVENOUS | Status: AC
  Administered 2013-01-05: 1000 mL via INTRAVENOUS

## 2013-01-05 MED ORDER — HYDROCODONE-ACETAMINOPHEN 5-325 MG PO TABS
1.0000 | ORAL_TABLET | ORAL | Status: DC | PRN
  Filled 2013-01-05: qty 2

## 2013-01-05 NOTE — ED Notes (Signed)
Pt c/o chills, fever, generalized body aches and vomiting x 3 started this morning.  Denies diarrhea.    Sts he has been sick on and off since his wife passed away, last May 23, 2023.

## 2013-01-05 NOTE — ED Notes (Signed)
Attempted IV access x2.  Pt is obese and a difficult stick.  IV rn notified.

## 2013-01-05 NOTE — ED Notes (Signed)
PA at bedside.

## 2013-01-05 NOTE — ED Notes (Signed)
IV team at bedside 

## 2013-01-05 NOTE — ED Notes (Addendum)
Patient given CT contrast

## 2013-01-05 NOTE — ED Notes (Signed)
Per EMS, Pt, from home, complaining of flu-like symptoms (fever and chills) and nausea starting this morning.  Vitals are stable.  Fever 102.7.  Hx spina bifida.

## 2013-01-05 NOTE — H&P (Signed)
History and Physical  Patrick Orozco ZOX:[id number] DOB: Mar 20, 1956 DOA: 01/05/2013  Referring physician: Ebbie Ridge, PA-C PCP: No primary provider on file. [facility]  Chief Complaint: Vomiting  HPI:  57 year old man presented with acute onset of fever, nausea, vomiting, generalized abdominal pain and left flank pain today. Initial evaluation was notable for UTI and CT findings suggesting past kidney stone.  Patient reports symptoms began early this morning after taking a bath with shortness of breath followed by generalized abdominal pain followed by 3 episodes of vomiting, nonbloody. Pain was especially noted to be in the left upper quadrant, ribs, left flank, left lower quadrant but also somewhat generalized over the abdomen. No recent urinary symptoms. Remote history of nephrolithiasis. Fever at home. Because of the severity of his symptoms he came to the emergency department.  In ED temp 102.8. Initial SBP 97 but repeat 143. Screening labs notable for CO2 17, plts 125, positive U/A. CXR negative. CT abdomen and pelvis revealed left pelvicaliectasis and hydroureter without obstructing  calculus. Could represent the sequela of a passed calculus. Given ceftriaxone.  Review of Systems:  Negative for fever, sore throat, congestion, rhinorrhea, rash, new muscle aches, chest pain, dysuria, bleeding.  Positive for minimal cough, some blurry vision lately, some shortness of breath earlier today. Uses a condom catheter and leg bag at home  Past Medical History  Diagnosis Date  . Spina bifida   . Hypertension   . Hyperlipidemia     Past Surgical History  Procedure Laterality Date  . Spine surgery      multiple surgeries related to spina bifida    Social History:  reports that he has never smoked. He does not have any smokeless tobacco history on file. He reports that he does not drink alcohol or use illicit drugs.  Allergies  Allergen Reactions  . Ivp Dye (Iodinated  Diagnostic Agents) Itching    No family history on file. No particular medical problems in the family. Mother does smoke.  Prior to Admission medications   Medication Sig Start Date End Date Taking? Authorizing Provider  metoprolol (TOPROL-XL) 50 MG 24 hr tablet Take 50 mg by mouth daily.     Yes Historical Provider, MD  simvastatin (ZOCOR) 40 MG tablet Take 40 mg by mouth at bedtime.     Yes Historical Provider, MD   Physical Exam: Filed Vitals:   01/05/13 1319 01/05/13 1329 01/05/13 1525 01/05/13 1700  BP:  97/47  143/78  Pulse:  113  78  Temp:  102.8 F (39.3 C) 98.6 F (37 C) 98.8 F (37.1 C)  TempSrc:  Oral Oral   Resp:  26  22  SpO2: 98% 100%  99%    General:  Examined in the emergency department. Appears calm and comfortable. Eyes: PERRL, normal lids, irises. Wears glasses. ENT: grossly normal hearing, lips & tongue Neck: no LAD, masses or thyromegaly Cardiovascular: RRR, no m/r/g. 3+ bilateral lower extremity edema left greater than right. Respiratory: CTA bilaterally, no w/r/r. Normal respiratory effort. Abdomen: soft, ntnd. Colostomy left lower quadrant. No rebound or guarding. Reports some left flank pain and left upper quadrant pain. Skin: no rash or induration seen on limited exam Musculoskeletal: Shortened lower limbs. Psychiatric: grossly normal mood and affect, speech fluent and appropriate, smiles, injects levity into situation Neurologic: grossly non-focal.  Wt Readings from Last 3 Encounters:  No data found for Wt    Labs on Admission:  Basic Metabolic Panel:  Recent Labs Lab 01/05/13 1505  NA  141  K 4.1  CL 108  CO2 17*  GLUCOSE 106*  BUN 20  CREATININE 1.17  CALCIUM 9.0    CBC:  Recent Labs Lab 01/05/13 1505  WBC 8.5  NEUTROABS 8.1*  HGB 15.0  HCT 44.2  MCV 90.4  PLT 125*     Radiological Exams on Admission: Ct Abdomen Pelvis Wo Contrast  01/05/2013  *RADIOLOGY REPORT*  Clinical Data: Abdominal pain and fever  CT ABDOMEN AND  PELVIS WITHOUT CONTRAST  Technique:  Multidetector CT imaging of the abdomen and pelvis was performed following the standard protocol without intravenous contrast.  Comparison: The CT abdomen 04/10/2005  Findings: There is a rounded than 3 cm mass within the junction of the right middle lobe and right upper lobe (image #1).  This is not completely imaged.  No pericardial fluid.  There is no focal hepatic lesion on this noncontrast exam.  The gallbladder, pancreas, spleen, and adrenal glands are normal.  There is a large calcification in the left renal hilum measuring 18 mm.  There is a 9 mm dependent calcification within the left renal pelvis.  There is mild dilatation of the left renal pelvis is increased compared to prior.  No evidence of obstructing calculi in the course of the left ureter which is mildly dilated.  Right kidney demonstrates a low density lesion in the upper pole which likely represents simple cyst (image 32).  This measures 25 mm and cannot be fully characterized without IV contrast.  No evidence of right ureterolithiasis.  The stomach, small bowel, appendix, and cecum are normal.  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  No evidence obstruction of the bowel.  Abdominal aorta normal caliber.  No retroperitoneal periportal lymphadenopathy.  The prostate gland bladder are normal.  There is no pelvic lymphadenopathy.  Rectal pouch appears normal.  There is severe degenerative changes of the hips again noted.  No aggressive osseous lesions  IMPRESSION: . 1.  Left pelvicaliectasis and hydroureter without obstructing calculus is identified.  This could represent the sequela of a passed calculus. 2. Large  calculi within the left renal pelvis and the left kidney. 3.  Low density lesion in the right kidney likely represents a simple cyst but cannot be fully characterized. 4.  Left lower quadrant colostomy without complication. 5.  There is a 3 cm mass within the right upper lobe  centrally. This is not fully characterized on this exam.  Recommend CT thorax with contrast for further evaluation.  Differential includes benign or  malignant neoplasm or metastasis.   Original Report Authenticated By: Genevive Bi, M.D.    Dg Chest 2 View  01/05/2013  *RADIOLOGY REPORT*  Clinical Data: Cough, fever.  CHEST - 2 VIEW  Comparison: June 24, 2010.  Findings: Cardiomediastinal silhouette appears normal.  No acute pulmonary disease is noted.  Bony thorax is intact.  IMPRESSION: No acute cardiopulmonary abnormality seen.   Original Report Authenticated By: Lupita Raider.,  M.D.     Principal Problem:   Acute pyelonephritis Active Problems:   Nausea and vomiting   Left flank pain   Thrombocytopenia   Hypertension   History of spina bifida   Assessment/Plan 1. Nausea, vomiting, left flank pain: Antiemetics, pain control. 2. Acute pyelonephritis with possible early sepsis: Vital signs stable now and appears clinically well. Empiric antibiotics. Followup urine culture. Suspect low serum CO2 secondary to GI loss. 3. Left pelvicaliectasis and hydroureter without obstructing  calculus: Consider followup with urology as an outpatient.  4. Thrombocytopenia: Followup CBC in the morning. PAF hose. Hold Lovenox until stable. 5. 3 cm mass within the right upper lobe centrally: Significance unclear. Remote smoking history. Consider followup scan as outpatient. 6. Low density lesion in the right kidney likely represents a simple cyst but cannot be fully characterized: Consider followup as an outpatient. 7. HTN: Stable. Continue Toprol-XL. 8. H/o spina bifida  Code Status: Full code Family Communication: None present Disposition Plan/Anticipated LOS: Admit inpatient, 2-4 days.  Time spent: 55 minutes  Brendia Sacks, MD  [facility] Pager [phone] 01/05/2013, 6:32 PM

## 2013-01-05 NOTE — ED Notes (Signed)
Patient transported to CT 

## 2013-01-05 NOTE — ED Provider Notes (Signed)
History     CSN: [id number]  Arrival date & time 01/05/13  1308   First MD Initiated Contact with Patient 01/05/13 1313      Chief Complaint  Patient presents with  . flu symptoms     (Consider location/radiation/quality/duration/timing/severity/associated sxs/prior treatment) HPI Patient presents to the emergency department with nausea, vomiting, flank pain, and fever.  Patient, states the symptoms began late last night, early this morning.  Patient denies chest pain, shortness of breath, headache, visual changes, syncope, dizziness, blood in his stool or back pain.  Patient, states, that he's feeling very weak, as well.  Patient did not take any medications prior to arrival for his symptoms.  Patient denies anything makes his condition, better or worse. Past Medical History  Diagnosis Date  . Spina bifida   . Hypertension   . Hyperlipidemia     Past Surgical History  Procedure Laterality Date  . Spine surgery      multiple surgeries related to spina bifida    No family history on file.  History  Substance Use Topics  . Smoking status: Never Smoker   . Smokeless tobacco: Not on file  . Alcohol Use: No      Review of Systems All other systems negative except as documented in the HPI. All pertinent positives and negatives as reviewed in the HPI. Allergies  Ivp dye  Home Medications   Current Outpatient Rx  Name  Route  Sig  Dispense  Refill  . metoprolol (TOPROL-XL) 50 MG 24 hr tablet   Oral   Take 50 mg by mouth daily.           . simvastatin (ZOCOR) 40 MG tablet   Oral   Take 40 mg by mouth at bedtime.             BP 143/78  Pulse 78  Temp(Src) 98.8 F (37.1 C) (Oral)  Resp 22  SpO2 99%  Physical Exam  Nursing note and vitals reviewed. Constitutional: He is oriented to person, place, and time. He appears well-developed and well-nourished. No distress.  HENT:  Head: Normocephalic and atraumatic.  Mouth/Throat: Oropharynx is clear and moist.   Eyes: Pupils are equal, round, and reactive to light.  Neck: Normal range of motion. Neck supple.  Cardiovascular: Regular rhythm and normal heart sounds.  Tachycardia present.  Exam reveals no gallop and no friction rub.   No murmur heard. Pulmonary/Chest: Effort normal and breath sounds normal. No respiratory distress.  Abdominal: Soft. Bowel sounds are normal. He exhibits no distension. There is no rebound and no guarding.  Patient has colostomy to left lower quadrant.  Patient has some lank tenderness on exam  Neurological: He is alert and oriented to person, place, and time.  Skin: Skin is warm and dry. No rash noted.    ED Course  Procedures (including critical care time)  Labs Reviewed  CBC WITH DIFFERENTIAL - Abnormal; Notable for the following:    Platelets 125 (*)    Neutrophils Relative 95 (*)    Neutro Abs 8.1 (*)    Lymphocytes Relative 3 (*)    Lymphs Abs 0.3 (*)    Monocytes Relative 1 (*)    All other components within normal limits  BASIC METABOLIC PANEL - Abnormal; Notable for the following:    CO2 17 (*)    Glucose, Bld 106 (*)    GFR calc non Af Amer 68 (*)    GFR calc Af Amer 78 (*)  All other components within normal limits  URINALYSIS, ROUTINE W REFLEX MICROSCOPIC - Abnormal; Notable for the following:    APPearance CLOUDY (*)    Hgb urine dipstick MODERATE (*)    Leukocytes, UA LARGE (*)    All other components within normal limits  URINE MICROSCOPIC-ADD ON - Abnormal; Notable for the following:    Bacteria, UA MANY (*)    Casts GRANULAR CAST (*)    All other components within normal limits  URINE CULTURE   Ct Abdomen Pelvis Wo Contrast  01/05/2013  *RADIOLOGY REPORT*  Clinical Data: Abdominal pain and fever  CT ABDOMEN AND PELVIS WITHOUT CONTRAST  Technique:  Multidetector CT imaging of the abdomen and pelvis was performed following the standard protocol without intravenous contrast.  Comparison: The CT abdomen 04/10/2005  Findings: There is a  rounded than 3 cm mass within the junction of the right middle lobe and right upper lobe (image #1).  This is not completely imaged.  No pericardial fluid.  There is no focal hepatic lesion on this noncontrast exam.  The gallbladder, pancreas, spleen, and adrenal glands are normal.  There is a large calcification in the left renal hilum measuring 18 mm.  There is a 9 mm dependent calcification within the left renal pelvis.  There is mild dilatation of the left renal pelvis is increased compared to prior.  No evidence of obstructing calculi in the course of the left ureter which is mildly dilated.  Right kidney demonstrates a low density lesion in the upper pole which likely represents simple cyst (image 32).  This measures 25 mm and cannot be fully characterized without IV contrast.  No evidence of right ureterolithiasis.  The stomach, small bowel, appendix, and cecum are normal.  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  No evidence obstruction of the bowel.  Abdominal aorta normal caliber.  No retroperitoneal periportal lymphadenopathy.  The prostate gland bladder are normal.  There is no pelvic lymphadenopathy.  Rectal pouch appears normal.  There is severe degenerative changes of the hips again noted.  No aggressive osseous lesions  IMPRESSION: . 1.  Left pelvicaliectasis and hydroureter without obstructing calculus is identified.  This could represent the sequela of a passed calculus. 2. Large  calculi within the left renal pelvis and the left kidney. 3.  Low density lesion in the right kidney likely represents a simple cyst but cannot be fully characterized. 4.  Left lower quadrant colostomy without complication. 5.  There is a 3 cm mass within the right upper lobe centrally. This is not fully characterized on this exam.  Recommend CT thorax with contrast for further evaluation.  Differential includes benign or  malignant neoplasm or metastasis.   Original Report Authenticated By: Genevive Bi, M.D.    Dg Chest 2 View  01/05/2013  *RADIOLOGY REPORT*  Clinical Data: Cough, fever.  CHEST - 2 VIEW  Comparison: June 24, 2010.  Findings: Cardiomediastinal silhouette appears normal.  No acute pulmonary disease is noted.  Bony thorax is intact.  IMPRESSION: No acute cardiopulmonary abnormality seen.   Original Report Authenticated By: Lupita Raider.,  M.D.      Patient is given IV Rocephin and fluids.  The patient's vital signs have normalized.  I spoke with the, [facility] service about the patient and they will admit him to the hospital for further evaluation and care.   MDM  MDM Reviewed: nursing note and vitals Interpretation: labs, x-ray and CT scan Consults: admitting MD  Carlyle Dolly, PA-C 01/05/13 2015  Carlyle Dolly, PA-C 01/05/13 2016

## 2013-01-05 NOTE — ED Provider Notes (Signed)
Medical screening examination/treatment/procedure(s) were performed by non-physician practitioner and as supervising physician I was immediately available for consultation/collaboration.    Renna Kilmer R Maeby Vankleeck, MD 01/05/13 2349 

## 2013-01-05 NOTE — ED Notes (Signed)
Bed:WA23<BR> Expected date:<BR> Expected time:<BR> Means of arrival:<BR> Comments:<BR> ems

## 2013-01-06 LAB — CBC
HCT: 39 % (ref 39.0–52.0)
Platelets: 109 10*3/uL — ABNORMAL LOW (ref 150–400)
RBC: 4.24 MIL/uL (ref 4.22–5.81)
RDW: 14.2 % (ref 11.5–15.5)
WBC: 23.7 10*3/uL — ABNORMAL HIGH (ref 4.0–10.5)

## 2013-01-06 LAB — BASIC METABOLIC PANEL
Chloride: 109 mEq/L (ref 96–112)
Creatinine, Ser: 1.16 mg/dL (ref 0.50–1.35)
GFR calc Af Amer: 79 mL/min — ABNORMAL LOW (ref >90)
Potassium: 4.2 mEq/L (ref 3.5–5.1)
Sodium: 139 mEq/L (ref 135–145)

## 2013-01-06 LAB — APTT: aPTT: 30 seconds (ref 24–37)

## 2013-01-06 LAB — INFLUENZA PANEL BY PCR (TYPE A & B)
H1N1 flu by pcr: NOT DETECTED
Influenza B By PCR: NEGATIVE

## 2013-01-06 LAB — CK TOTAL AND CKMB (NOT AT ARMC): Relative Index: 1.3 (ref 0.0–2.5)

## 2013-01-06 MED ORDER — HEPARIN (PORCINE) IN NACL 100-0.45 UNIT/ML-% IJ SOLN
1850.0000 [IU]/h | INTRAMUSCULAR | Status: DC
  Administered 2013-01-06: 1250 [IU]/h via INTRAVENOUS
  Administered 2013-01-06 (×2): 1550 [IU]/h via INTRAVENOUS
  Filled 2013-01-06 (×5): qty 250

## 2013-01-06 MED ORDER — MUPIROCIN 2 % EX OINT
TOPICAL_OINTMENT | Freq: Two times a day (BID) | CUTANEOUS | Status: AC
  Administered 2013-01-06 – 2013-01-11 (×10): via NASAL
  Filled 2013-01-06: qty 22

## 2013-01-06 MED ORDER — HEPARIN BOLUS VIA INFUSION
4000.0000 [IU] | Freq: Once | INTRAVENOUS | Status: AC
  Administered 2013-01-06: 4000 [IU] via INTRAVENOUS
  Filled 2013-01-06: qty 4000

## 2013-01-06 MED ORDER — ASPIRIN 81 MG PO CHEW
324.0000 mg | CHEWABLE_TABLET | Freq: Once | ORAL | Status: AC
  Administered 2013-01-06: 324 mg via ORAL
  Filled 2013-01-06: qty 4

## 2013-01-06 MED ORDER — METOPROLOL TARTRATE 25 MG PO TABS
25.0000 mg | ORAL_TABLET | Freq: Two times a day (BID) | ORAL | Status: DC
  Administered 2013-01-06 – 2013-01-11 (×11): 25 mg via ORAL
  Filled 2013-01-06 (×13): qty 1

## 2013-01-06 MED ORDER — SODIUM CHLORIDE 0.9 % IV BOLUS (SEPSIS)
500.0000 mL | Freq: Once | INTRAVENOUS | Status: AC
  Administered 2013-01-06: 500 mL via INTRAVENOUS

## 2013-01-06 MED ORDER — CHLORHEXIDINE GLUCONATE CLOTH 2 % EX PADS
6.0000 | MEDICATED_PAD | Freq: Every day | CUTANEOUS | Status: DC
  Administered 2013-01-08 – 2013-01-11 (×4): 6 via TOPICAL

## 2013-01-06 MED ORDER — MORPHINE SULFATE 2 MG/ML IJ SOLN
2.0000 mg | INTRAMUSCULAR | Status: AC
  Administered 2013-01-06: 2 mg via INTRAVENOUS

## 2013-01-06 MED ORDER — NITROGLYCERIN 0.4 MG SL SUBL: 0.4000 mg | SUBLINGUAL_TABLET | SUBLINGUAL | Status: DC | PRN

## 2013-01-06 MED ORDER — HEPARIN BOLUS VIA INFUSION
3000.0000 [IU] | Freq: Once | INTRAVENOUS | Status: AC
  Administered 2013-01-06: 3000 [IU] via INTRAVENOUS
  Filled 2013-01-06: qty 3000

## 2013-01-06 NOTE — Progress Notes (Signed)
Patient with order to be transferred to [facility] unit.  Spoke with Shawn with bed management and he stated a stepdown room should be available soon.  Dr. Sharyn Lull made aware.  Allayne Butcher [facility]  01/06/2013

## 2013-01-06 NOTE — Progress Notes (Signed)
ANTICOAGULATION CONSULT NOTE - Follow-Up Consult  Pharmacy Consult for heparin Indication: chest pain/ACS  Allergies  Allergen Reactions  . Ivp Dye (Iodinated Diagnostic Agents) Itching   Labs:  Recent Labs  01/05/13 1505 01/06/13 0350 01/06/13 0610 01/06/13 1014 01/06/13 1158 01/06/13 1716  HGB 15.0 13.1  --   --   --   --   HCT 44.2 39.0  --   --   --   --   PLT 125* 109*  --   --   --   --   APTT  --   --   --  30  --   --   LABPROT  --   --   --  16.8*  --   --   INR  --   --   --  1.40  --   --   HEPARINUNFRC  --   --   --   --   --  0.10*  CREATININE 1.17 1.16  --   --   --   --   CKTOTAL  --   --   --   --  400*  --   CKMB  --   --   --   --  5.2*  --   TROPONINI  --   --  0.35*  --  <0.30  --     Estimated Creatinine Clearance: 87.2 ml/min (by C-G formula based on Cr of 1.16).   Assessment: 56 YOM adm 3/5 pm w/ fever, abd pain, vomiting. PMH: obesity, spina bifida, HTN, hyperlipidemia. Developed CP overnight, elevated trop.  1st heparin level low at 0.10  Goal of Therapy:  Heparin level 0.3-0.7 units/ml Monitor platelets by anticoagulation protocol: Yes   Plan:  1) Heparin 3000 units iv bolus x 1 2) Increase heparin drip to 1550 units / hr 3) 6 hr heparin level  Thank you. Okey Regal, PharmD [phone] 01/06/2013 5:53 PM

## 2013-01-06 NOTE — Clinical Documentation Improvement (Signed)
Abnormal Labs Clarification  THIS DOCUMENT IS NOT A PERMANENT PART OF THE MEDICAL RECORD  TO RESPOND TO THE THIS QUERY, FOLLOW THE INSTRUCTIONS BELOW:  1. If needed, update documentation for the patient's encounter via the notes activity.  2. Access this query again and click edit on the [employer].  3. After updating, or not, click F2 to complete all highlighted (required) fields concerning your review. Select "additional documentation in the medical record" OR "no additional documentation provided".  4. Click Sign note button.  5. The deficiency will fall out of your InBasket *Please let us know if you are not able to complete this workflow by phone or e-mail (listed below).  Please update your documentation within the medical record to reflect your response to this query.                                                                                   01/06/13  Dear Dr. Suanne Marker, Mervyn Skeeters [facility]  In a better effort to capture your patient's severity of illness, reflect appropriate length of stay and utilization of resources, a review of the medical record has revealed the following indicators.    Based on your clinical judgment, please clarify and document in a progress note and/or discharge summary the clinical condition associated with the following supporting information:  In responding to this query please exercise your independent judgment.  The fact that a query is asked, does not imply that any particular answer is desired or expected.  Abnormal findings (laboratory, x-ray, pathologic, and other diagnostic results) are not coded and reported unless the physician indicates their clinical significance.   The medical record reflects the following clinical findings, please clarify the diagnostic and/or clinical significance:      Pt c/o CP per pn   Clarification Needed    Please clarify the underlying diagnosis responsible for the chest pain in setting of elevated  Troponin=0.35 and ? Wave abnormality per EKG necessitating the treatment of Metoprolol.       Possible Clinical Conditions?                                  ____________________                                        Other Condition___________________                  Cannot Clinically Determine_________      Supporting Information: Risk Factors: UTI, Pyelonephritis, N/V, Left pelvicaliectasis, hydrouireter, HTN, C/O Left sided Chest Pain. Signs/Symptoms:   PN 01/06/13 Chest pain-new. Have followed appropriate chest pain protocol to this point. His pain is better now. EKG with ? twave abnormality and there is not an old one for comparison. Awaiting troponin. If trop neg, can d/c O2.    Nurse note 01/06/13 Critical value: Troponin 0.35  Diagnostics: Component     Latest Ref Rng 01/06/2013  Troponin I     <0.30 ng/mL 0.35 (HH)  12/09/12 EKG: Nonspecific T  wave abnormality  Treatment:   Metoprolol  Monitoring EKG/Labs  Chest pain protocol  2 D Echo pending   Reviewed: additional documentation in the medical record ljh.   Q=NSTEMI agreed on Robynn Pane, MD at 01/11/2013  9:18 AM. Not on D/C summary so it does not count.    Thank You,  Enis Slipper  RN, BSN, MSN/Inf, CCDS Clinical Documentation Specialist [facility] Dept Pager: [phone] / E-mail: [email or web address]  Health Information Management [place]

## 2013-01-06 NOTE — Consult Note (Signed)
Reason for Consult: Chest pain/mildly elevated troponin I Referring Physician: [facility]  Patrick Orozco is an 57 y.o. male.  HPI: Patient is 57 year old male with past medical history significant for hypertension hypercholesteremia remote tobacco abuse history of kidney stones, history of spinal bifid status post multiple surgeries now mostly wheelchair-bound, chronic venous insufficiency was admitted yesterday because of vague lower abdominal pain flank pain associated with chills and vomiting and palpitations and was noted to have nephrolithiasis with urosepsis possible pyelonephritis. Cardiologic consultation is called as patient developed earlier today left-sided chest pain and was noted to be hypotensive her requiring bolus normal saline of 500 cc with normalization of blood pressure. Troponin I. a drawn they're mildly elevated to 0.35 EKG showed normal sinus rhythm with nonspecific T wave changes. Patient presently denies any chest pain nausea vomiting. Patient denies any history of exertional chest pain although his activity is practically nil. Patient does give history of dyspnea for last few months since his wife passed away. Patient denies any cardiac workup in the past. Denies lightheadedness or syncope. Denies any PND orthopnea but complains of chronic leg swelling left more than right. Patient states his flank and abdominal pain has improved.  Past Medical History  Diagnosis Date  . Spina bifida   . Hypertension   . Hyperlipidemia   . Kidney stones     Past Surgical History  Procedure Laterality Date  . Spine surgery      multiple surgeries related to spina bifida  . Colostomy    . 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 No pertinent family history.  Social History:  reports that he quit smoking about 19 years ago. He has never used smokeless tobacco. He reports that he does not drink alcohol or use illicit drugs.  Allergies:  Allergies   Allergen Reactions  . Ivp Dye (Iodinated Diagnostic Agents) Itching    Medications: I have reviewed the patient's current medications.  Results for orders placed during the hospital encounter of 01/05/13 (from the past 48 hour(s))  URINALYSIS, ROUTINE W REFLEX MICROSCOPIC     Status: Abnormal   Collection Time    01/05/13  1:51 PM      Result Value Range   Color, Urine YELLOW  YELLOW   APPearance CLOUDY (*) CLEAR   Specific Gravity, Urine 1.010  1.005 - 1.030   pH 5.5  5.0 - 8.0   Glucose, UA NEGATIVE  NEGATIVE mg/dL   Hgb urine dipstick MODERATE (*) NEGATIVE   Bilirubin Urine NEGATIVE  NEGATIVE   Ketones, ur NEGATIVE  NEGATIVE mg/dL   Protein, ur NEGATIVE  NEGATIVE mg/dL   Urobilinogen, UA 0.2  0.0 - 1.0 mg/dL   Nitrite NEGATIVE  NEGATIVE   Leukocytes, UA LARGE (*) NEGATIVE  URINE MICROSCOPIC-ADD ON     Status: Abnormal   Collection Time    01/05/13  1:51 PM      Result Value Range   Squamous Epithelial / LPF RARE  RARE   WBC, UA 11-20  <3 WBC/hpf   RBC / HPF 3-6  <3 RBC/hpf   Bacteria, UA MANY (*) RARE   Casts GRANULAR CAST (*) NEGATIVE  CBC WITH DIFFERENTIAL     Status: Abnormal   Collection Time    01/05/13  3:05 PM      Result Value Range   WBC 8.5  4.0 - 10.5 K/uL   RBC 4.89  4.22 - 5.81 MIL/uL   Hemoglobin  15.0  13.0 - 17.0 g/dL   HCT 78.2  95.6 - 21.3 %   MCV 90.4  78.0 - 100.0 fL   MCH 30.7  26.0 - 34.0 pg   MCHC 33.9  30.0 - 36.0 g/dL   RDW 08.6  57.8 - 46.9 %   Platelets 125 (*) 150 - 400 K/uL   Comment: REPEATED TO VERIFY     SPECIMEN CHECKED FOR CLOTS   Neutrophils Relative 95 (*) 43 - 77 %   Neutro Abs 8.1 (*) 1.7 - 7.7 K/uL   Lymphocytes Relative 3 (*) 12 - 46 %   Lymphs Abs 0.3 (*) 0.7 - 4.0 K/uL   Monocytes Relative 1 (*) 3 - 12 %   Monocytes Absolute 0.1  0.1 - 1.0 K/uL   Eosinophils Relative 0  0 - 5 %   Eosinophils Absolute 0.0  0.0 - 0.7 K/uL   Basophils Relative 0  0 - 1 %   Basophils Absolute 0.0  0.0 - 0.1 K/uL  BASIC METABOLIC PANEL      Status: Abnormal   Collection Time    01/05/13  3:05 PM      Result Value Range   Sodium 141  135 - 145 mEq/L   Potassium 4.1  3.5 - 5.1 mEq/L   Chloride 108  96 - 112 mEq/L   CO2 17 (*) 19 - 32 mEq/L   Glucose, Bld 106 (*) 70 - 99 mg/dL   BUN 20  6 - 23 mg/dL   Creatinine, Ser 6.29  0.50 - 1.35 mg/dL   Calcium 9.0  8.4 - 52.8 mg/dL   GFR calc non Af Amer 68 (*) >90 mL/min   GFR calc Af Amer 78 (*) >90 mL/min   Comment:            The eGFR has been calculated     using the CKD EPI equation.     This calculation has not been     validated in all clinical     situations.     eGFR's persistently     <90 mL/min signify     possible Chronic Kidney Disease.  INFLUENZA PANEL BY PCR     Status: None   Collection Time    01/05/13 10:39 PM      Result Value Range   Influenza A By PCR NEGATIVE  NEGATIVE   Influenza B By PCR NEGATIVE  NEGATIVE   H1N1 flu by pcr NOT DETECTED  NOT DETECTED   Comment:            The Xpert Flu assay (FDA approved for     nasal aspirates or washes and     nasopharyngeal swab specimens), is     intended as an aid in the diagnosis of     influenza and should not be used as     a sole basis for treatment.  BASIC METABOLIC PANEL     Status: Abnormal   Collection Time    01/06/13  3:50 AM      Result Value Range   Sodium 139  135 - 145 mEq/L   Potassium 4.2  3.5 - 5.1 mEq/L   Chloride 109  96 - 112 mEq/L   CO2 20  19 - 32 mEq/L   Glucose, Bld 90  70 - 99 mg/dL   BUN 19  6 - 23 mg/dL   Creatinine, Ser 4.13  0.50 - 1.35 mg/dL   Calcium 8.6  8.4 - 24.4  mg/dL   GFR calc non Af Amer 68 (*) >90 mL/min   GFR calc Af Amer 79 (*) >90 mL/min   Comment:            The eGFR has been calculated     using the CKD EPI equation.     This calculation has not been     validated in all clinical     situations.     eGFR's persistently     <90 mL/min signify     possible Chronic Kidney Disease.  CBC     Status: Abnormal   Collection Time    01/06/13  3:50 AM       Result Value Range   WBC 23.7 (*) 4.0 - 10.5 K/uL   RBC 4.24  4.22 - 5.81 MIL/uL   Hemoglobin 13.1  13.0 - 17.0 g/dL   HCT 95.2  84.1 - 32.4 %   MCV 92.0  78.0 - 100.0 fL   MCH 30.9  26.0 - 34.0 pg   MCHC 33.6  30.0 - 36.0 g/dL   RDW 40.1  02.7 - 25.3 %   Platelets 109 (*) 150 - 400 K/uL   Comment: REPEATED TO VERIFY     SPECIMEN CHECKED FOR CLOTS     CONSISTENT WITH PREVIOUS RESULT  TROPONIN I     Status: Abnormal   Collection Time    01/06/13  6:10 AM      Result Value Range   Troponin I 0.35 (*) <0.30 ng/mL   Comment:            Due to the release kinetics of cTnI,     a negative result within the first hours     of the onset of symptoms does not rule out     myocardial infarction with certainty.     If myocardial infarction is still suspected,     repeat the test at appropriate intervals.     CRITICAL RESULT CALLED TO, READ BACK BY AND VERIFIED WITH:     Jamas Lav RN AT [phone] ON [id number] BY MCREYNOLDS, B  APTT     Status: None   Collection Time    01/06/13 10:14 AM      Result Value Range   aPTT 30  24 - 37 seconds  PROTIME-INR     Status: Abnormal   Collection Time    01/06/13 10:14 AM      Result Value Range   Prothrombin Time 16.8 (*) 11.6 - 15.2 seconds   INR 1.40  0.00 - 1.49    Ct Abdomen Pelvis Wo Contrast  01/05/2013  *RADIOLOGY REPORT*  Clinical Data: Abdominal pain and fever  CT ABDOMEN AND PELVIS WITHOUT CONTRAST  Technique:  Multidetector CT imaging of the abdomen and pelvis was performed following the standard protocol without intravenous contrast.  Comparison: The CT abdomen 04/10/2005  Findings: There is a rounded than 3 cm mass within the junction of the right middle lobe and right upper lobe (image #1).  This is not completely imaged.  No pericardial fluid.  There is no focal hepatic lesion on this noncontrast exam.  The gallbladder, pancreas, spleen, and adrenal glands are normal.  There is a large calcification in the left renal hilum measuring 18 mm.   There is a 9 mm dependent calcification within the left renal pelvis.  There is mild dilatation of the left renal pelvis is increased compared to prior.  No evidence of obstructing calculi in the course  of the left ureter which is mildly dilated.  Right kidney demonstrates a low density lesion in the upper pole which likely represents simple cyst (image 32).  This measures 25 mm and cannot be fully characterized without IV contrast.  No evidence of right ureterolithiasis.  The stomach, small bowel, appendix, and cecum are normal.  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  No evidence obstruction of the bowel.  Abdominal aorta normal caliber.  No retroperitoneal periportal lymphadenopathy.  The prostate gland bladder are normal.  There is no pelvic lymphadenopathy.  Rectal pouch appears normal.  There is severe degenerative changes of the hips again noted.  No aggressive osseous lesions  IMPRESSION: . 1.  Left pelvicaliectasis and hydroureter without obstructing calculus is identified.  This could represent the sequela of a passed calculus. 2. Large  calculi within the left renal pelvis and the left kidney. 3.  Low density lesion in the right kidney likely represents a simple cyst but cannot be fully characterized. 4.  Left lower quadrant colostomy without complication. 5.  There is a 3 cm mass within the right upper lobe centrally. This is not fully characterized on this exam.  Recommend CT thorax with contrast for further evaluation.  Differential includes benign or  malignant neoplasm or metastasis.   Original Report Authenticated By: Genevive Bi, M.D.    Dg Chest 2 View  01/05/2013  *RADIOLOGY REPORT*  Clinical Data: Cough, fever.  CHEST - 2 VIEW  Comparison: June 24, 2010.  Findings: Cardiomediastinal silhouette appears normal.  No acute pulmonary disease is noted.  Bony thorax is intact.  IMPRESSION: No acute cardiopulmonary abnormality seen.   Original Report Authenticated By: Lupita Raider.,  M.D.     Review of Systems  Constitutional: Positive for fever and chills.  Eyes: Negative for blurred vision and double vision.  Respiratory: Negative for cough, hemoptysis and sputum production.   Cardiovascular: Positive for chest pain, palpitations and leg swelling. Negative for orthopnea.  Gastrointestinal: Positive for nausea, vomiting and abdominal pain.  Genitourinary: Positive for flank pain.  Neurological: Negative for dizziness.   Blood pressure 116/55, pulse 104, temperature 97.2 F (36.2 C), temperature source Oral, resp. rate 18, height 5\' 4"  (1.626 m), weight 113.399 kg (250 lb), SpO2 96.00%. Physical Exam  Constitutional: He appears well-developed and well-nourished.  HENT:  Head: Normocephalic and atraumatic.  Eyes: Conjunctivae are normal. Left eye exhibits no discharge. No scleral icterus.  Neck: Normal range of motion. Neck supple. No tracheal deviation present. No thyromegaly present.  Cardiovascular: Normal rate.   Regular rate and rhythm S1 and S2 soft there is soft systolic murmur no S3 gallop  Respiratory: Effort normal and breath sounds normal. No respiratory distress. He has no wheezes. He has no rales.  GI: Soft. Bowel sounds are normal. He exhibits distension. There is tenderness (Mild left flank tenderness noted). There is no rebound and no guarding.  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

## 2013-01-06 NOTE — Progress Notes (Signed)
  Echocardiogram 2D Echocardiogram has been performed.  Georgian Co 01/06/2013, 3:37 PM

## 2013-01-06 NOTE — Care Management (Signed)
CARE MANAGE MENT UTILIZATION REVIEW NOTE 01/06/2013     Patient:  Patrick Orozco, Patrick Orozco   Account Number:  [id number]  Documented by:  Roxy Manns DAVIS   Per Ur Regulation Reviewed for med. necessity/level of care/duration of stay

## 2013-01-06 NOTE — Progress Notes (Signed)
[facility] PROGRESS NOTE  Patrick Orozco WUJ:[id number] DOB: 11-23-1955 DOA: 01/05/2013 PCP: No primary provider on file.  Assessment/Plan: 1. Non-ST elevation MI -in Patient with multiple risk factors including hypertension, hyperlipidemia, obesity, former smoker -Possibly secondary to demand ischemia given the hypotension last p.m. in setting of infection/sepsis syndrome -Continue aspirin, beta blocker (decreased dose with hold parameters), statin and nitroglycerin when necessary -Consulted cardiology, discussed patient with Dr. Sharyn Lull who recommends starting patient on heparin drip and transferring to [facility] 2.Acute pyelonephritis with sepsis:  -Hypotensive early this a.m.>> BP responded well to IVF bolus -wbc worsened to 23.7 this a.m., follow and recheck in am -Continue current antibiotics pending urine cultures, also obtain blood cultures. 3. 3 cm mass within the right upper lobe centrally:  -in pt Remote smoking history.  -Will need a CT scan of chest to further eval once #1&2 stabilized 4.Thrombocytopenia:  -Platelet count 109 this a.m., possibly secondary to infection/sepsis syndrome  -Monitor closely with heparin drip being started for #1 -No gross bleeding -Followup CBC in the morning. 5. Left pelvicaliectasis and hydroureter without obstructing calculus: Consider followup with urology as an outpatient. 6.Low density lesion in the right kidney likely represents a simple cyst but cannot be fully characterized: Consider followup as an outpatient. 7.HTN: Stable.  8. Hyperlipidemia -Continue statin 9.H/o spina bifida   Code Status: Full code Family Communication: Directly with patient at bedside Disposition Plan: Transfer to [facility] Step down unit   Consultants:  Cardiology-Dr. Particia Jasper  Procedures:  Echo pending  Antibiotics:  Rocephin-started on 3/5  HPI/Subjective:  Patient had left sided chest pain earlier this a.m., states no further chest pain  at this time. He also has had no further nausea or vomiting, and reports less left flank pain.  Objective: Filed Vitals:   01/05/13 1951 01/05/13 2135 01/06/13 0441 01/06/13 0600  BP: 110/73 106/73 88/57 116/55  Pulse: 112 123 103 104  Temp: 97.9 F (36.6 C) 98.6 F (37 C) 97.2 F (36.2 C)   TempSrc: Oral Oral Oral   Resp: 18 18 18    Height:  5\' 4"  (1.626 m)    Weight:  113.399 kg (250 lb)    SpO2: 97% 97% 97% 96%    Intake/Output Summary (Last 24 hours) at 01/06/13 0849 Last data filed at 01/06/13 0459  Gross per 24 hour  Intake    545 ml  Output   1000 ml  Net   -455 ml   Filed Weights   01/05/13 2135  Weight: 113.399 kg (250 lb)    Exam:   General:  Older male in no apparent distress, awake and oriented x3   Cardiovascular: Regular rate and rhythm, normal S1-S2  Respiratory clear to auscultation bilaterally no crackles wheezes  Abdomen: Soft, bowel sounds present nontender nondistended no organomegaly no masses palpable  Extremities: No cyanosis and no edema  Data Reviewed: Basic Metabolic Panel:  Recent Labs Lab 01/05/13 1505 01/06/13 0350  NA 141 139  K 4.1 4.2  CL 108 109  CO2 17* 20  GLUCOSE 106* 90  BUN 20 19  CREATININE 1.17 1.16  CALCIUM 9.0 8.6   Liver Function Tests: No results found for this basename: AST, ALT, ALKPHOS, BILITOT, PROT, ALBUMIN,  in the last 168 hours No results found for this basename: LIPASE, AMYLASE,  in the last 168 hours No results found for this basename: AMMONIA,  in the last 168 hours CBC:  Recent Labs Lab 01/05/13 1505 01/06/13 0350  WBC 8.5 23.7*  NEUTROABS 8.1*  --   HGB 15.0 13.1  HCT 44.2 39.0  MCV 90.4 92.0  PLT 125* 109*   Cardiac Enzymes:  Recent Labs Lab 01/06/13 0610  TROPONINI 0.35*   BNP (last 3 results) No results found for this basename: PROBNP,  in the last 8760 hours CBG: No results found for this basename: GLUCAP,  in the last 168 hours  No results found for this or any  previous visit (from the past 240 hour(s)).   Studies: Ct Abdomen Pelvis Wo Contrast  01/05/2013  *RADIOLOGY REPORT*  Clinical Data: Abdominal pain and fever  CT ABDOMEN AND PELVIS WITHOUT CONTRAST  Technique:  Multidetector CT imaging of the abdomen and pelvis was performed following the standard protocol without intravenous contrast.  Comparison: The CT abdomen 04/10/2005  Findings: There is a rounded than 3 cm mass within the junction of the right middle lobe and right upper lobe (image #1).  This is not completely imaged.  No pericardial fluid.  There is no focal hepatic lesion on this noncontrast exam.  The gallbladder, pancreas, spleen, and adrenal glands are normal.  There is a large calcification in the left renal hilum measuring 18 mm.  There is a 9 mm dependent calcification within the left renal pelvis.  There is mild dilatation of the left renal pelvis is increased compared to prior.  No evidence of obstructing calculi in the course of the left ureter which is mildly dilated.  Right kidney demonstrates a low density lesion in the upper pole which likely represents simple cyst (image 32).  This measures 25 mm and cannot be fully characterized without IV contrast.  No evidence of right ureterolithiasis.  The stomach, small bowel, appendix, and cecum are normal.  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  No evidence obstruction of the bowel.  Abdominal aorta normal caliber.  No retroperitoneal periportal lymphadenopathy.  The prostate gland bladder are normal.  There is no pelvic lymphadenopathy.  Rectal pouch appears normal.  There is severe degenerative changes of the hips again noted.  No aggressive osseous lesions  IMPRESSION: . 1.  Left pelvicaliectasis and hydroureter without obstructing calculus is identified.  This could represent the sequela of a passed calculus. 2. Large  calculi within the left renal pelvis and the left kidney. 3.  Low density lesion in the right kidney likely  represents a simple cyst but cannot be fully characterized. 4.  Left lower quadrant colostomy without complication. 5.  There is a 3 cm mass within the right upper lobe centrally. This is not fully characterized on this exam.  Recommend CT thorax with contrast for further evaluation.  Differential includes benign or  malignant neoplasm or metastasis.   Original Report Authenticated By: Genevive Bi, M.D.    Dg Chest 2 View  01/05/2013  *RADIOLOGY REPORT*  Clinical Data: Cough, fever.  CHEST - 2 VIEW  Comparison: June 24, 2010.  Findings: Cardiomediastinal silhouette appears normal.  No acute pulmonary disease is noted.  Bony thorax is intact.  IMPRESSION: No acute cardiopulmonary abnormality seen.   Original Report Authenticated By: Lupita Raider.,  M.D.     Scheduled Meds: . cefTRIAXone (ROCEPHIN)  IV  1 g Intravenous Q24H  . metoprolol succinate  50 mg Oral Daily  . simvastatin  40 mg Oral QHS   Continuous Infusions: . sodium chloride 125 mL/hr at 01/06/13 1610    Principal Problem:   Acute pyelonephritis Active Problems:   Nausea and vomiting  Left flank pain   Thrombocytopenia   Hypertension   History of spina bifida    Time spent: >35    Kela Millin  [facility] Pager [phone]. If 7PM-7AM, please contact night-coverage at [email or web address], password [facility] 01/06/2013, 8:49 AM  LOS: 1 day

## 2013-01-06 NOTE — Progress Notes (Signed)
ANTICOAGULATION CONSULT NOTE - Initial Consult  Pharmacy Consult for heparin Indication: chest pain/ACS  Allergies  Allergen Reactions  . Ivp Dye (Iodinated Diagnostic Agents) Itching    Patient Measurements: Height: 5\' 4"  (162.6 cm) Weight: 250 lb (113.399 kg) IBW/kg (Calculated) : 59.2 Heparin Dosing Weight: 81.9  Vital Signs: Temp: 97.2 F (36.2 C) (03/06 0441) Temp src: Oral (03/06 0441) BP: 116/55 mmHg (03/06 0600) Pulse Rate: 104 (03/06 0600)  Labs:  Recent Labs  01/05/13 1505 01/06/13 0350 01/06/13 0610  HGB 15.0 13.1  --   HCT 44.2 39.0  --   PLT 125* 109*  --   CREATININE 1.17 1.16  --   TROPONINI  --   --  0.35*    Estimated Creatinine Clearance: 80.4 ml/min (by C-G formula based on Cr of 1.16).   Medical History: Past Medical History  Diagnosis Date  . Spina bifida   . Hypertension   . Hyperlipidemia   . Kidney stones     Medications:  Scheduled:  . [COMPLETED] acetaminophen  650 mg Oral Once  . [COMPLETED] aspirin  324 mg Oral Once  . [COMPLETED] cefTRIAXone (ROCEPHIN)  IV  1 g Intravenous Once  . cefTRIAXone (ROCEPHIN)  IV  1 g Intravenous Q24H  . heparin  4,000 Units Intravenous Once  . metoprolol tartrate  25 mg Oral BID  . [COMPLETED]  morphine injection  2 mg Intravenous NOW  . simvastatin  40 mg Oral QHS  . [COMPLETED] sodium chloride  1,000 mL Intravenous Once  . [COMPLETED] sodium chloride  500 mL Intravenous Once  . [DISCONTINUED] metoprolol succinate  50 mg Oral Daily   Infusions:  . sodium chloride 125 mL/hr at 01/06/13 0850  . heparin     PRN: acetaminophen, acetaminophen, HYDROcodone-acetaminophen, morphine injection, nitroGLYCERIN, ondansetron (ZOFRAN) IV, ondansetron  Assessment: 56 YOM adm 3/5 pm w/ fever, abd pain, vomiting. PMH: obesity, spina bifida, HTN, hyperlipidemia. Developed CP overnight, elevated trop. Rx to start heparin gtt. Heparin dose wt 81.9 (113.4kg, IBW 54.7kg). Note low pltc  Goal of Therapy:   Heparin level 0.3-0.7 units/ml Monitor platelets by anticoagulation protocol: Yes   Plan:  PTT and PT/INR stat.  Heparin bolus 4000 units then 1250 units/hr.  HL in 6h and daily.  Daily CBC  Gwen Her PharmD  [phone] 01/06/2013 9:47 AM

## 2013-01-06 NOTE — Progress Notes (Addendum)
Shift event: called by RN 2/2 pt c/o left sided chest pain. Pt said began approximately 45 min prior to him mentioning it. 4/10 on pain scale. Morphine 2mg , ASA 81mg  chewable x 4 given. O2 applied, EKG done and troponin ordered. NP to unit. Pt also had a soft BP earlier and bolus 500cc NS given.  Pt appears well in NAD. Very pleasant male. Alert and oriented x 3. Says his pain is already better. He is also hurting on that left flank side 2/2 pyelo. He denies hx of MI, stents in heart, etc. Does have HTN and HLD on meds. He is not SOB. BP up to 116/77 now. HR, O2 sat normal. 1. Chest pain-new. Have followed appropriate chest pain protocol to this point. His pain is better now. EKG with ? twave abnormality and there is not an old one for comparison. Awaiting troponin. If trop neg, can d/c O2.  Likely referred pain from the pyelo on the left side. He is stable. Will cont to follow and report findings to oncoming attending.  Jimmye Norman, NP [facility] Addendum: 1st trop .35. Report given to oncoming attending. Will need cardio consult this am.  Hortencia Conradi, NP

## 2013-01-06 NOTE — Progress Notes (Signed)
Patient to be transferred to room 2605 at [facility].  [facility] notified to transport patient.  Patient made aware of room assignment.  Allayne Butcher [facility]  01/06/2013  11:55 AM

## 2013-01-06 NOTE — Progress Notes (Signed)
CRITICAL VALUE ALERT  Critical value received:  Troponin 0.35  Date of notification:  01/06/2013  Time of notification:  0645  Critical value read back:yes  Nurse who received alert:  Selinda Flavin, RN   MD notified (1st page):  Craige Cotta, NP  Time of first page:  [phone]  MD notified (2nd page): Viyuoh  Time of second page:  0709, 0720 Notified Director Hickling at [phone], left a voicemail  Responding MD: never received any verbal response. Report given to oncoming nurse, will continue to page MD's.   Time MD responded:

## 2013-01-07 DIAGNOSIS — D869 Sarcoidosis, unspecified: Secondary | ICD-10-CM | POA: Diagnosis present

## 2013-01-07 DIAGNOSIS — Z8669 Personal history of other diseases of the nervous system and sense organs: Secondary | ICD-10-CM

## 2013-01-07 DIAGNOSIS — R0789 Other chest pain: Secondary | ICD-10-CM

## 2013-01-07 DIAGNOSIS — R222 Localized swelling, mass and lump, trunk: Secondary | ICD-10-CM

## 2013-01-07 LAB — URINE CULTURE: Colony Count: >=100000

## 2013-01-07 LAB — BASIC METABOLIC PANEL
BUN: 16 mg/dL (ref 6–23)
CO2: 20 mEq/L (ref 19–32)
Calcium: 8.9 mg/dL (ref 8.4–10.5)
GFR calc non Af Amer: 70 mL/min — ABNORMAL LOW (ref >90)
Glucose, Bld: 58 mg/dL — ABNORMAL LOW (ref 70–99)

## 2013-01-07 LAB — HEPARIN LEVEL (UNFRACTIONATED): Heparin Unfractionated: 0.25 IU/mL — ABNORMAL LOW (ref 0.30–0.70)

## 2013-01-07 LAB — CK TOTAL AND CKMB (NOT AT ARMC)
CK, MB: 2.8 ng/mL (ref 0.3–4.0)
Relative Index: 1.4 (ref 0.0–2.5)
Total CK: 207 U/L (ref 7–232)

## 2013-01-07 MED ORDER — MORPHINE SULFATE 2 MG/ML IJ SOLN
2.0000 mg | INTRAMUSCULAR | Status: DC | PRN
  Administered 2013-01-10: 2 mg via INTRAVENOUS
  Filled 2013-01-07: qty 1

## 2013-01-07 MED ORDER — HEPARIN BOLUS VIA INFUSION
2500.0000 [IU] | Freq: Once | INTRAVENOUS | Status: AC
  Administered 2013-01-07: 2500 [IU] via INTRAVENOUS
  Filled 2013-01-07: qty 2500

## 2013-01-07 MED ORDER — ASPIRIN 81 MG PO CHEW
81.0000 mg | CHEWABLE_TABLET | Freq: Every day | ORAL | Status: DC
  Administered 2013-01-07 – 2013-01-11 (×5): 81 mg via ORAL
  Filled 2013-01-07 (×5): qty 1

## 2013-01-07 MED ORDER — ENOXAPARIN SODIUM 40 MG/0.4ML ~~LOC~~ SOLN
40.0000 mg | SUBCUTANEOUS | Status: DC
  Administered 2013-01-07: 40 mg via SUBCUTANEOUS
  Filled 2013-01-07 (×2): qty 0.4

## 2013-01-07 MED ORDER — OXYCODONE HCL 5 MG PO TABS: 5.0000 mg | ORAL_TABLET | ORAL | Status: DC | PRN

## 2013-01-07 NOTE — Progress Notes (Signed)
[facility] Progress Note [place] TEAM 1 - Stepdown/ICU TEAM   Patrick Orozco ZOX:[id number] DOB: 1956-04-15 DOA: 01/05/2013 PCP: No primary provider on file.  Brief narrative: 57 year old man who presented to the hospital due to the abrupt onset of fever associated with nausea and vomiting as well as generalized abdominal pain radiating from the left flank. Urinalysis was consistent with a probable urinary tract infection and CT abdomen and pelvis revealed likely recent passage of renal calculi. Patient endorsed remote history of nephrolithiasis. In the Emergency department his temperature was 102.8 initial blood pressure was 97 while a followup was 143 systolic. Platelets were low at 125,000. Chest x-ray was negative for acute process. CT of the abdomen revealed left pelvicaliectasis and hydroureter without obstructing calculus likely representative of sequela of passed calculus.  Assessment/Plan:  Acute pyelonephritis / UTI (urinary tract infection), bacterial > GNR's -Likely due to recurrent renal calculi currently nonobstructive -White count markedly elevated 23,000-repeat labs in a.m. -Followup on urine cultures - continue empiric antibiotic -cont hydration w/ IVF's  Thrombocytopenia -due to GNR infection- follow  Hypertension -BP controlled -cont. Lopressor  Atypical chest pain /Cardiac enzymes elevated -only VERY subtle increase in TNI  -EKG unremarkable -CP not reproducible with palpation but also no "classic" sx's -appreciate Cards assistance- add'l evaluation ?OP at their discretion -ECHO w/ preserved EF but possible mild hypokinesis of lateral myocardium -dc Heparin infusion -suspect mild demand ischemia from SIRS/pyelo w/ dehydration    Recurrent kidney stones-non obstructive -has seen Urologist remotely- will need OP follow up -current stones non obstructive  History of spina bifida/H/O paraplegia -has diverting colostomy and lives at home  Lung mass-  RUL 3 cm -remote smoking history -will need CT Chest before dc once better hydrated and renal fxn proven to be stable    DVT prophylaxis: Lovenox  Code Status:  FULL Family Communication:  patient Disposition Plan: Transfer to telemetry   Consultants: Cardiology - Dr. Sharyn Lull  Procedures: None   Antibiotics: Rocephin 3/5 >>>  HPI/Subjective: Patient alert and continues to complain of left flank pain. Has had some left anterior chest discomfort which he describes as being like a pulled muscle.  Nausea vomiting fevers chills or headache.  No shortness of breath.  Objective: Blood pressure 136/73, pulse 88, temperature 98.1 F (36.7 C), temperature source Oral, resp. rate 23, height 5\' 4"  (1.626 m), weight 130.4 kg (287 lb 7.7 oz), SpO2 96.00%.  Intake/Output Summary (Last 24 hours) at 01/07/13 1201 Last data filed at 01/07/13 1000  Gross per 24 hour  Intake 2679.5 ml  Output   2750 ml  Net  -70.5 ml    Exam: General: No acute respiratory distress Lungs: Clear to auscultation bilaterally without wheezes or crackles Cardiovascular: Regular rate and rhythm without murmur gallop or rub normal S1 and S2 Abdomen: Nontender, nondistended, soft, bowel sounds positive, no rebound, no ascites, no appreciable mass Extremities: No significant cyanosis, clubbing, or edema bilateral lower extremities Neurological: Alert and oriented x3, moves upper extremities easily with strength being 4/5 bilateral, lower extremities flaccid   Data Reviewed: Basic Metabolic Panel:  Recent Labs Lab 01/05/13 1505 01/06/13 0350 01/07/13 0225  NA 141 139 145  K 4.1 4.2 4.2  CL 108 109 109  CO2 17* 20 20  GLUCOSE 106* 90 58*  BUN 20 19 16   CREATININE 1.17 1.16 1.13  CALCIUM 9.0 8.6 8.9   CBC:  Recent Labs Lab 01/05/13 1505 01/06/13 0350  WBC 8.5 23.7*  NEUTROABS 8.1*  --  HGB 15.0 13.1  HCT 44.2 39.0  MCV 90.4 92.0  PLT 125* 109*   Cardiac Enzymes:  Recent Labs Lab  01/06/13 0610 01/06/13 1158 01/07/13 0225 01/07/13 1102  CKTOTAL  --  400* 258* 207  CKMB  --  5.2* 3.7 2.8  TROPONINI 0.35* <0.30 <0.30  --     Recent Results (from the past 240 hour(s))  URINE CULTURE     Status: None   Collection Time    01/05/13  1:51 PM      Result Value Range Status   Specimen Description URINE, CLEAN CATCH   Final   Special Requests NONE   Final   Culture  Setup Time 01/05/2013 22:00   Final   Colony Count >=100,000 COLONIES/ML   Final   Culture GRAM NEGATIVE RODS   Final   Report Status PENDING   Incomplete  CULTURE, BLOOD (ROUTINE X 2)     Status: None   Collection Time    01/06/13 10:14 AM      Result Value Range Status   Specimen Description BLOOD RIGHT HAND   Final   Special Requests BOTTLES DRAWN AEROBIC ONLY 5CC   Final   Culture  Setup Time 01/06/2013 14:03   Final   Culture     Final   Value:        BLOOD CULTURE RECEIVED NO GROWTH TO DATE CULTURE WILL BE HELD FOR 5 DAYS BEFORE ISSUING A FINAL NEGATIVE REPORT   Report Status PENDING   Incomplete  CULTURE, BLOOD (ROUTINE X 2)     Status: None   Collection Time    01/06/13 10:18 AM      Result Value Range Status   Specimen Description BLOOD RIGHT HAND   Final   Special Requests BOTTLES DRAWN AEROBIC AND ANAEROBIC [facility]   Final   Culture  Setup Time 01/06/2013 14:03   Final   Culture     Final   Value:        BLOOD CULTURE RECEIVED NO GROWTH TO DATE CULTURE WILL BE HELD FOR 5 DAYS BEFORE ISSUING A FINAL NEGATIVE REPORT   Report Status PENDING   Incomplete  MRSA PCR SCREENING     Status: Abnormal   Collection Time    01/06/13  2:14 PM      Result Value Range Status   MRSA by PCR POSITIVE (*) NEGATIVE Final   Comment:            The GeneXpert MRSA Assay (FDA     approved for NASAL specimens     only), is one component of a     comprehensive MRSA colonization     surveillance program. It is not     intended to diagnose MRSA     infection nor to guide or     monitor treatment for     MRSA  infections.     RESULT CALLED TO, READ BACK BY AND VERIFIED WITH:     Burtis Junes RN 16:15 01/06/13 ([email or web address])     Studies:  Recent x-ray studies have been reviewed in detail by the Attending Physician  Scheduled Meds:  Reviewed in detail by the Attending Physician   Junious Silk, ANP  [facility] Office  [phone] Pager [phone]  On-Call/Text Page:      [email or web address]      password [email or web address]  If 7PM-7AM, please contact night-coverage [email or web address] Password [email or web address] 01/07/2013, 12:01 PM   LOS: 2 days   I have personally examined this patient  and reviewed the entire database. I have reviewed the above note, made any necessary editorial changes, and agree with its content.  Cherene Altes, MD [facility]

## 2013-01-07 NOTE — Progress Notes (Signed)
ANTICOAGULATION CONSULT NOTE - Follow-Up Consult  Pharmacy Consult for heparin Indication: chest pain/ACS  Allergies  Allergen Reactions  . Ivp Dye (Iodinated Diagnostic Agents) Itching   Labs:  Recent Labs  01/05/13 1505 01/06/13 0350 01/06/13 0610 01/06/13 1014 01/06/13 1158 01/06/13 1716 01/07/13 0043  HGB 15.0 13.1  --   --   --   --   --   HCT 44.2 39.0  --   --   --   --   --   PLT 125* 109*  --   --   --   --   --   APTT  --   --   --  30  --   --   --   LABPROT  --   --   --  16.8*  --   --   --   INR  --   --   --  1.40  --   --   --   HEPARINUNFRC  --   --   --   --   --  0.10* 0.11*  CREATININE 1.17 1.16  --   --   --   --   --   CKTOTAL  --   --   --   --  400*  --   --   CKMB  --   --   --   --  5.2*  --   --   TROPONINI  --   --  0.35*  --  <0.30  --   --     Estimated Creatinine Clearance: 87.2 ml/min (by C-G formula based on Cr of 1.16).   Assessment: 56 YOM adm 3/5 pm w/ fever, abd pain, vomiting. PMH: obesity, spina bifida, HTN, hyperlipidemia. Developed CP overnight, elevated trop.  Heparin level (0.11) below-goal on 1550 units/hr. No problem with line / infusion and no bleeding per RN.   Goal of Therapy:  Heparin level 0.3-0.7 units/ml Monitor platelets by anticoagulation protocol: Yes   Plan:  1. Heparin 2500 units IV bolus x 1, then increase IV infusion to 1850 units/hr.   2. Heparin level in 6 hours.   Lorre Munroe, PharmD  01/07/2013 2:49 AM

## 2013-01-07 NOTE — Progress Notes (Signed)
NURSING PROGRESS NOTE  JIBRAN CROOKSHANKS [id number] Transfer Data: 01/07/2013 5:49 PM Attending Provider: Lonia Blood, MD PCP:No primary provider on file. Code Status: full   Sencere T Perritt is a 57 y.o. male patient transferred from 2600 No acute distress noted.  No c/o shortness of breath, no c/o chest pain.  Cardiac tele # [phone], in place, cardiac monitor yields:normal sinus rhythm.  Blood pressure 90/61, pulse 78, temperature 97.5 F (36.4 C), temperature source Oral, resp. rate 20, height 5\' 4"  (1.626 m), weight 130.4 kg (287 lb 7.7 oz), SpO2 97.00%.   IV Fluids:  IV in place, occlusive dsg intact without redness, IV cath hand right, condition patent and no redness none.   Allergies:  Ivp dye  Past Medical History:   has a past medical history of Spina bifida; Hypertension; Hyperlipidemia; and Kidney stones.  Past Surgical History:   has past surgical history that includes Spine surgery; Colostomy; and Toe amputation (2010).  Social History:   reports that he quit smoking about 19 years ago. He has never used smokeless tobacco. He reports that he does not drink alcohol or use illicit drugs.  Skin: healing pressure ulcers to sacrum.   Orientation to room, and floor completed with information packet given to patient/family.   SR up x 2, fall assessment complete, with patient and family able to verbalize understanding of risk associated with falls, and verbalized understanding to call for assistance before getting out of bed.   Call light within reach. Patient able to voice and demonstrate understanding of unit orientation instructions.   Will cont to eval and treat per MD orders.  Whitaker, Elmarie Mainland, RN

## 2013-01-07 NOTE — Progress Notes (Signed)
Subjective:  Patient denies any chest pain or shortness of breath. Denies any further palpitations. Next 3 sets of cardiac enzymes are negative. EKG no significant acute ischemic changes. 2-D echo showed good LV systolic function with questionable mild lateral wall hypokinesis  Objective:  Vital Signs in the last 24 hours: Temp:  [97.6 F (36.4 C)-100.3 F (37.9 C)] 98.1 F (36.7 C) (03/07 0809) Pulse Rate:  [80-99] 88 (03/07 0809) Resp:  [20-27] 23 (03/07 0809) BP: (92-142)/(51-95) 136/73 mmHg (03/07 0809) SpO2:  [96 %-100 %] 96 % (03/07 0809) Weight:  [130.4 kg (287 lb 7.7 oz)] 130.4 kg (287 lb 7.7 oz) (03/06 1416)  Intake/Output from previous day: 03/06 0701 - 03/07 0700 In: 2489 [P.O.:720; I.V.:1719; IV Piggyback:50] Out: 2200 [Urine:2200] Intake/Output from this shift: Total I/O In: 430.5 [I.V.:430.5] Out: 850 [Urine:850]  Physical Exam: Neck: no adenopathy, no carotid bruit, no JVD and supple, symmetrical, trachea midline Lungs: clear to auscultation bilaterally Heart: regular rate and rhythm, S1, S2 normal, no murmur, click, rub or gallop Abdomen: soft, non-tender; bowel sounds normal; no masses,  no organomegaly Extremities: extremities normal, atraumatic, no cyanosis or edema  Lab Results:  Recent Labs  01/05/13 1505 01/06/13 0350  WBC 8.5 23.7*  HGB 15.0 13.1  PLT 125* 109*    Recent Labs  01/06/13 0350 01/07/13 0225  NA 139 145  K 4.2 4.2  CL 109 109  CO2 20 20  GLUCOSE 90 58*  BUN 19 16  CREATININE 1.16 1.13    Recent Labs  01/06/13 1158 01/07/13 0225  TROPONINI <0.30 <0.30   Hepatic Function Panel No results found for this basename: PROT, ALBUMIN, AST, ALT, ALKPHOS, BILITOT, BILIDIR, IBILI,  in the last 72 hours No results found for this basename: CHOL,  in the last 72 hours No results found for this basename: PROTIME,  in the last 72 hours  Imaging: Imaging results have been reviewed and Ct Abdomen Pelvis Wo Contrast  01/05/2013   *RADIOLOGY REPORT*  Clinical Data: Abdominal pain and fever  CT ABDOMEN AND PELVIS WITHOUT CONTRAST  Technique:  Multidetector CT imaging of the abdomen and pelvis was performed following the standard protocol without intravenous contrast.  Comparison: The CT abdomen 04/10/2005  Findings: There is a rounded than 3 cm mass within the junction of the right middle lobe and right upper lobe (image #1).  This is not completely imaged.  No pericardial fluid.  There is no focal hepatic lesion on this noncontrast exam.  The gallbladder, pancreas, spleen, and adrenal glands are normal.  There is a large calcification in the left renal hilum measuring 18 mm.  There is a 9 mm dependent calcification within the left renal pelvis.  There is mild dilatation of the left renal pelvis is increased compared to prior.  No evidence of obstructing calculi in the course of the left ureter which is mildly dilated.  Right kidney demonstrates a low density lesion in the upper pole which likely represents simple cyst (image 32).  This measures 25 mm and cannot be fully characterized without IV contrast.  No evidence of right ureterolithiasis.  The stomach, small bowel, appendix, and cecum are normal.  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  No evidence obstruction of the bowel.  Abdominal aorta normal caliber.  No retroperitoneal periportal lymphadenopathy.  The prostate gland bladder are normal.  There is no pelvic lymphadenopathy.  Rectal pouch appears normal.  There is severe degenerative changes of the hips again noted.  No aggressive osseous lesions  IMPRESSION: . 1.  Left pelvicaliectasis and hydroureter without obstructing calculus is identified.  This could represent the sequela of a passed calculus. 2. Large  calculi within the left renal pelvis and the left kidney. 3.  Low density lesion in the right kidney likely represents a simple cyst but cannot be fully characterized. 4.  Left lower quadrant colostomy without  complication. 5.  There is a 3 cm mass within the right upper lobe centrally. This is not fully characterized on this exam.  Recommend CT thorax with contrast for further evaluation.  Differential includes benign or  malignant neoplasm or metastasis.   Original Report Authenticated By: Genevive Bi, M.D.    Dg Chest 2 View  01/05/2013  *RADIOLOGY REPORT*  Clinical Data: Cough, fever.  CHEST - 2 VIEW  Comparison: June 24, 2010.  Findings: Cardiomediastinal silhouette appears normal.  No acute pulmonary disease is noted.  Bony thorax is intact.  IMPRESSION: No acute cardiopulmonary abnormality seen.   Original Report Authenticated By: Lupita Raider.,  M.D.     Cardiac Studies:  Assessment/Plan:  Probable very small non-Q-wave myocardial infarction precipitated by demand ischemia/hypotension  Hypertension  Hypercholesteremia  Morbid obesity  Remote tobacco abuse  Possible pyelonephritis /nephrolithiasis  Marked leukocytosis  Thrombocytopenia probably secondary to sepsis  Left upper lobe mass questionable etiology  Morbid obesity  History of spinal bifid Plan DC heparin Increase beta blockers as tolerated Okay to transfer to telemetry Will schedule for nuclear stress test in a few days or as outpatient.  LOS: 2 days    HARWANI,MOHAN N 01/07/2013, 10:17 AM

## 2013-01-07 NOTE — Progress Notes (Addendum)
ANTICOAGULATION CONSULT NOTE - Follow-Up Consult  Pharmacy Consult for heparin Indication: chest pain/ACS  Allergies  Allergen Reactions  . Ivp Dye (Iodinated Diagnostic Agents) Itching   Labs:  Recent Labs  01/05/13 1505 01/06/13 0350 01/06/13 0610 01/06/13 1014 01/06/13 1158 01/06/13 1716 01/07/13 0043 01/07/13 0225 01/07/13 0823  HGB 15.0 13.1  --   --   --   --   --   --   --   HCT 44.2 39.0  --   --   --   --   --   --   --   PLT 125* 109*  --   --   --   --   --   --   --   APTT  --   --   --  30  --   --   --   --   --   LABPROT  --   --   --  16.8*  --   --   --   --   --   INR  --   --   --  1.40  --   --   --   --   --   HEPARINUNFRC  --   --   --   --   --  0.10* 0.11*  --  0.25*  CREATININE 1.17 1.16  --   --   --   --   --  1.13  --   CKTOTAL  --   --   --   --  400*  --   --  258*  --   CKMB  --   --   --   --  5.2*  --   --  3.7  --   TROPONINI  --   --  0.35*  --  <0.30  --   --  <0.30  --     Estimated Creatinine Clearance: 89.5 ml/min (by C-G formula based on Cr of 1.13).   Assessment: 56 YOM adm 3/5 pm w/ fever, abd pain, vomiting. PMH: obesity, spina bifida, HTN, hyperlipidemia. Developed CP overnight, elevated trop.  Heparin level remains less than goal, but orders just received to turn off heparin and begin Lovenox at DVT prophylaxis dosing. No bleeding or complications noted per chart notes.  Goal of Therapy:  Heparin level 0.3-0.7 units/ml Monitor platelets by anticoagulation protocol: Yes   Plan:  1. D/C IV heparin 2. Pharmacy will sign-off.   Tad Moore, BCPS  Clinical Pharmacist Pager [phone]  01/07/2013 9:18 AM

## 2013-01-08 LAB — BASIC METABOLIC PANEL
CO2: 22 mEq/L (ref 19–32)
Calcium: 8.8 mg/dL (ref 8.4–10.5)
Creatinine, Ser: 1.01 mg/dL (ref 0.50–1.35)
GFR calc non Af Amer: 81 mL/min — ABNORMAL LOW (ref >90)
Glucose, Bld: 90 mg/dL (ref 70–99)
Sodium: 144 mEq/L (ref 135–145)

## 2013-01-08 LAB — CBC
Hemoglobin: 11.6 g/dL — ABNORMAL LOW (ref 13.0–17.0)
MCH: 30.8 pg (ref 26.0–34.0)
Platelets: 93 10*3/uL — ABNORMAL LOW (ref 150–400)
RBC: 3.77 MIL/uL — ABNORMAL LOW (ref 4.22–5.81)

## 2013-01-08 NOTE — Progress Notes (Signed)
[facility] Progress Note    Patrick Orozco ZOX:[id number] DOB: 05-02-1956 DOA: 01/05/2013 PCP: No primary provider on file.  HPI/Subjective: Denies any fever or chills. Denies any abdominal pain.  Brief narrative: 57 year old man who presented to the hospital due to the abrupt onset of fever associated with nausea and vomiting as well as generalized abdominal pain radiating from the left flank. Urinalysis was consistent with a probable urinary tract infection and CT abdomen and pelvis revealed likely recent passage of renal calculi. Patient endorsed remote history of nephrolithiasis. In the Emergency department his temperature was 102.8 initial blood pressure was 97 while a followup was 143 systolic. Platelets were low at 125,000. Chest x-ray was negative for acute process. CT of the abdomen revealed left pelvicaliectasis and hydroureter without obstructing calculus likely representative of sequela of passed calculus.  Assessment/Plan:  Acute pyelonephritis / UTI (urinary tract infection) -Likely due to recurrent renal calculi currently nonobstructive -White count markedly elevated 23,000-repeat labs in a.m. -Escherichia coli acute pyelonephritis, resistance to fluoroquinolones noted. -cont hydration w/ IVF's  Sepsis -Secondary to UTI/acute pyelonephritis. -Upon presentation pulse was 104 blood pressure 88/57. -Vitals is back to baseline now.  Thrombocytopenia -due to GNR infection- follow  Hypertension -BP controlled -cont. Lopressor  Atypical chest pain /Cardiac enzymes elevated -only VERY subtle increase in TNI  -EKG unremarkable -CP not reproducible with palpation but also no "classic" sx's -appreciate Cards assistance- add'l evaluation ?OP at their discretion -ECHO w/ preserved EF but possible mild hypokinesis of lateral myocardium -dc Heparin infusion -suspect mild demand ischemia from SIRS/pyelo w/ dehydration    Recurrent kidney stones-non obstructive -has  seen Urologist remotely- will need OP follow up -current stones non obstructive  History of spina bifida/H/O paraplegia -has diverting colostomy and lives at home  Lung mass- RUL 3 cm -remote smoking history -3 cm RUL lesion, will obtain CT scan with contrast today.   DVT prophylaxis: Lovenox  Code Status:  FULL Family Communication:  patient Disposition Plan: Remain as inpatient, telemetry  Consultants: Cardiology - Dr. Sharyn Lull  Procedures: None   Antibiotics: Rocephin 3/5 >>>    Objective: Blood pressure 139/80, pulse 85, temperature 98.8 F (37.1 C), temperature source Oral, resp. rate 20, height 5\' 4"  (1.626 m), weight 130.4 kg (287 lb 7.7 oz), SpO2 98.00%.  Intake/Output Summary (Last 24 hours) at 01/08/13 0949 Last data filed at 01/08/13 0806  Gross per 24 hour  Intake 1428.5 ml  Output   3400 ml  Net -1971.5 ml    Exam: General: No acute respiratory distress Lungs: Clear to auscultation bilaterally without wheezes or crackles Cardiovascular: Regular rate and rhythm without murmur gallop or rub normal S1 and S2 Abdomen: Nontender, nondistended, soft, bowel sounds positive, no rebound, no ascites, no appreciable mass Extremities: No significant cyanosis, clubbing, or edema bilateral lower extremities Neurological: Alert and oriented x3, moves upper extremities easily with strength being 4/5 bilateral, lower extremities flaccid   Data Reviewed: Basic Metabolic Panel:  Recent Labs Lab 01/05/13 1505 01/06/13 0350 01/07/13 0225 01/08/13 0510  NA 141 139 145 144  K 4.1 4.2 4.2 3.8  CL 108 109 109 113*  CO2 17* 20 20 22   GLUCOSE 106* 90 58* 90  BUN 20 19 16 13   CREATININE 1.17 1.16 1.13 1.01  CALCIUM 9.0 8.6 8.9 8.8   CBC:  Recent Labs Lab 01/05/13 1505 01/06/13 0350 01/08/13 0510  WBC 8.5 23.7* 7.9  NEUTROABS 8.1*  --   --   HGB 15.0 13.1 11.6*  HCT 44.2 39.0 33.7*  MCV 90.4 92.0 89.4  PLT 125* 109* 93*   Cardiac Enzymes:  Recent  Labs Lab 01/06/13 0610 01/06/13 1158 01/07/13 0225 01/07/13 1102  CKTOTAL  --  400* 258* 207  CKMB  --  5.2* 3.7 2.8  TROPONINI 0.35* <0.30 <0.30  --     Recent Results (from the past 240 hour(s))  URINE CULTURE     Status: None   Collection Time    01/05/13  1:51 PM      Result Value Range Status   Specimen Description URINE, CLEAN CATCH   Final   Special Requests NONE   Final   Culture  Setup Time 01/05/2013 22:00   Final   Colony Count >=100,000 COLONIES/ML   Final   Culture ESCHERICHIA COLI   Final   Report Status 01/07/2013 FINAL   Final   Organism ID, Bacteria ESCHERICHIA COLI   Final  CULTURE, BLOOD (ROUTINE X 2)     Status: None   Collection Time    01/06/13 10:14 AM      Result Value Range Status   Specimen Description BLOOD RIGHT HAND   Final   Special Requests BOTTLES DRAWN AEROBIC ONLY 5CC   Final   Culture  Setup Time 01/06/2013 14:03   Final   Culture     Final   Value:        BLOOD CULTURE RECEIVED NO GROWTH TO DATE CULTURE WILL BE HELD FOR 5 DAYS BEFORE ISSUING A FINAL NEGATIVE REPORT   Report Status PENDING   Incomplete  CULTURE, BLOOD (ROUTINE X 2)     Status: None   Collection Time    01/06/13 10:18 AM      Result Value Range Status   Specimen Description BLOOD RIGHT HAND   Final   Special Requests BOTTLES DRAWN AEROBIC AND ANAEROBIC [facility]   Final   Culture  Setup Time 01/06/2013 14:03   Final   Culture     Final   Value:        BLOOD CULTURE RECEIVED NO GROWTH TO DATE CULTURE WILL BE HELD FOR 5 DAYS BEFORE ISSUING A FINAL NEGATIVE REPORT   Report Status PENDING   Incomplete  MRSA PCR SCREENING     Status: Abnormal   Collection Time    01/06/13  2:14 PM      Result Value Range Status   MRSA by PCR POSITIVE (*) NEGATIVE Final   Comment:            The GeneXpert MRSA Assay (FDA     approved for NASAL specimens     only), is one component of a     comprehensive MRSA colonization     surveillance program. It is not     intended to diagnose MRSA      infection nor to guide or     monitor treatment for     MRSA infections.     RESULT CALLED TO, READ BACK BY AND VERIFIED WITH:     Burtis Junes RN 16:15 01/06/13 ([email or web address])     Studies:  Recent x-ray studies have been reviewed in detail by the Attending Physician  Scheduled Meds:  Reviewed in detail by the Attending Physician   Clint Lipps Pager: [phone] 01/08/2013, 9:54 AM

## 2013-01-08 NOTE — Progress Notes (Signed)
Subjective:  Patient denies any further chest pain or shortness of breath. States he wants to l get stress test done while he is in the hospital Objective:  Vital Signs in the last 24 hours: Temp:  [97.5 F (36.4 C)-98.8 F (37.1 C)] 98.8 F (37.1 C) (03/08 0514) Pulse Rate:  [71-97] 85 (03/08 1006) Resp:  [18-20] 20 (03/08 0514) BP: (90-153)/(61-88) 125/87 mmHg (03/08 1006) SpO2:  [97 %-98 %] 98 % (03/08 0514)  Intake/Output from previous day: 03/07 0701 - 03/08 0700 In: 1715.5 [P.O.:360; I.V.:1305.5; IV Piggyback:50] Out: 3700 [Urine:3650; Stool:50] Intake/Output from this shift: Total I/O In: -  Out: 550 [Urine:550]  Physical Exam: Neck: no adenopathy, no carotid bruit, no JVD and supple, symmetrical, trachea midline Lungs: clear to auscultation bilaterally Heart: regular rate and rhythm, S1, S2 normal, no murmur, click, rub or gallop Abdomen: soft, non-tender; bowel sounds normal; no masses,  no organomegaly Extremities: extremities normal, atraumatic, no cyanosis or edema  Lab Results:  Recent Labs  01/06/13 0350 01/08/13 0510  WBC 23.7* 7.9  HGB 13.1 11.6*  PLT 109* 93*    Recent Labs  01/07/13 0225 01/08/13 0510  NA 145 144  K 4.2 3.8  CL 109 113*  CO2 20 22  GLUCOSE 58* 90  BUN 16 13  CREATININE 1.13 1.01    Recent Labs  01/06/13 1158 01/07/13 0225  TROPONINI <0.30 <0.30   Hepatic Function Panel No results found for this basename: PROT, ALBUMIN, AST, ALT, ALKPHOS, BILITOT, BILIDIR, IBILI,  in the last 72 hours No results found for this basename: CHOL,  in the last 72 hours No results found for this basename: PROTIME,  in the last 72 hours  Imaging: Imaging results have been reviewed and No results found.  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

## 2013-01-09 ENCOUNTER — Inpatient Hospital Stay (HOSPITAL_COMMUNITY): Payer: Medicare Other

## 2013-01-09 NOTE — Progress Notes (Signed)
Subjective:  Patient denies any further chest pain or shortness of breath. Scheduled for nuclear stress test   Objective:  Vital Signs in the last 24 hours: Temp:  [97.4 F (36.3 C)-98.6 F (37 C)] 98.6 F (37 C) (03/09 0503) Pulse Rate:  [68-75] 75 (03/09 0503) Resp:  [18-20] 18 (03/09 0503) BP: (130-169)/(81-83) 160/81 mmHg (03/09 0503) SpO2:  [96 %-98 %] 96 % (03/09 0503)  Intake/Output from previous day: 03/08 0701 - 03/09 0700 In: 4968 [P.O.:718; I.V.:4250] Out: 4850 [Urine:4800; Stool:50] Intake/Output from this shift:    Physical Exam: Neck: no adenopathy, no carotid bruit, no JVD and supple, symmetrical, trachea midline Lungs: clear to auscultation bilaterally Heart: regular rate and rhythm, S1, S2 normal, no murmur, click, rub or gallop Abdomen: soft, non-tender; bowel sounds normal; no masses,  no organomegaly Extremities: extremities normal, atraumatic, no cyanosis or edema  Lab Results:  Recent Labs  01/08/13 0510  WBC 7.9  HGB 11.6*  PLT 93*    Recent Labs  01/07/13 0225 01/08/13 0510  NA 145 144  K 4.2 3.8  CL 109 113*  CO2 20 22  GLUCOSE 58* 90  BUN 16 13  CREATININE 1.13 1.01    Recent Labs  01/06/13 1158 01/07/13 0225  TROPONINI <0.30 <0.30   Hepatic Function Panel No results found for this basename: PROT, ALBUMIN, AST, ALT, ALKPHOS, BILITOT, BILIDIR, IBILI,  in the last 72 hours No results found for this basename: CHOL,  in the last 72 hours No results found for this basename: PROTIME,  in the last 72 hours  Imaging: Imaging results have been reviewed and No results found.  Cardiac Studies:  Assessment/Plan:  Probable very small non-Q-wave myocardial infarction precipitated by demand ischemia/hypotension  Hypertension  Hypercholesteremia  Morbid obesity  Remote tobacco abuse  Possible pyelonephritis /nephrolithiasis  Marked leukocytosis  Thrombocytopenia probably secondary to sepsis  Left upper lobe mass questionable  etiology  Morbid obesity  History of spinal bifid Plan Continue present management Scheduled for nuclear stress test today  LOS: 4 days    HARWANI,MOHAN N 01/09/2013, 10:58 AM

## 2013-01-09 NOTE — Progress Notes (Signed)
PT Cancellation Note  Patient Details Name: Patrick Orozco MRN: [id number] DOB: August 11, 1956   Cancelled Treatment:    Reason Eval Not Completed: Per RN they have already injected him to prepare for stress test and he should be transported downstairs soon. Will follow-up as schedule allows.   SASSER,LYNN 01/09/2013, 10:48 AM Pager [phone]

## 2013-01-09 NOTE — Progress Notes (Signed)
[facility] Progress Note    Patrick Orozco ZOX:[id number] DOB: Feb 13, 1956 DOA: 01/05/2013 PCP: No primary provider on file.  HPI/Subjective: Feels okay, denies any chest pain or shortness of breath for stress test today.   Brief narrative: 57 year old man who presented to the hospital due to the abrupt onset of fever associated with nausea and vomiting as well as generalized abdominal pain radiating from the left flank. Urinalysis was consistent with a probable urinary tract infection and CT abdomen and pelvis revealed likely recent passage of renal calculi. Patient endorsed remote history of nephrolithiasis. In the Emergency department his temperature was 102.8 initial blood pressure was 97 while a followup was 143 systolic. Platelets were low at 125,000. Chest x-ray was negative for acute process. CT of the abdomen revealed left pelvicaliectasis and hydroureter without obstructing calculus likely representative of sequela of passed calculus.  Assessment/Plan:  Acute pyelonephritis / UTI (urinary tract infection) -Likely due to recurrent renal calculi currently nonobstructive -White count markedly elevated 23,000-repeat labs in a.m. -Escherichia coli acute pyelonephritis, resistance to fluoroquinolones noted. -cont hydration w/ IVF's  Sepsis -Secondary to UTI/acute pyelonephritis. -Upon presentation pulse was 104 blood pressure 88/57. -Vitals is back to baseline now.  Thrombocytopenia -due to GNR infection- follow  Hypertension -BP controlled -cont. Lopressor  Atypical chest pain /Cardiac enzymes elevated -only VERY subtle increase in TNI  -EKG unremarkable -CP not reproducible with palpation but also no "classic" sx's -appreciate Cards assistance- add'l evaluation ?OP at their discretion -ECHO w/ preserved EF but possible mild hypokinesis of lateral myocardium -dc Heparin infusion -suspect mild demand ischemia from SIRS/pyelo w/ dehydration -For stress test today.    Recurrent kidney stones-non obstructive -has seen Urologist remotely- will need OP follow up -current stones non obstructive  History of spina bifida/H/O paraplegia -has diverting colostomy and lives at home  Lung mass- RUL 3 cm -remote smoking history -3 cm RUL lesion, will obtain CT scan with contrast today.   DVT prophylaxis: Lovenox  Code Status:  FULL Family Communication:  patient Disposition Plan: Remain as inpatient, telemetry  Consultants: Cardiology - Dr. Sharyn Lull  Procedures: None   Antibiotics: Rocephin 3/5 >>>    Objective: Blood pressure 160/81, pulse 75, temperature 98.6 F (37 C), temperature source Oral, resp. rate 18, height 5\' 4"  (1.626 m), weight 130.4 kg (287 lb 7.7 oz), SpO2 96.00%.  Intake/Output Summary (Last 24 hours) at 01/09/13 1032 Last data filed at 01/09/13 0503  Gross per 24 hour  Intake   4968 ml  Output   4300 ml  Net    668 ml    Exam: General: No acute respiratory distress Lungs: Clear to auscultation bilaterally without wheezes or crackles Cardiovascular: Regular rate and rhythm without murmur gallop or rub normal S1 and S2 Abdomen: Nontender, nondistended, soft, bowel sounds positive, no rebound, no ascites, no appreciable mass Extremities: No significant cyanosis, clubbing, or edema bilateral lower extremities Neurological: Alert and oriented x3, moves upper extremities easily with strength being 4/5 bilateral, lower extremities flaccid   Data Reviewed: Basic Metabolic Panel:  Recent Labs Lab 01/05/13 1505 01/06/13 0350 01/07/13 0225 01/08/13 0510  NA 141 139 145 144  K 4.1 4.2 4.2 3.8  CL 108 109 109 113*  CO2 17* 20 20 22   GLUCOSE 106* 90 58* 90  BUN 20 19 16 13   CREATININE 1.17 1.16 1.13 1.01  CALCIUM 9.0 8.6 8.9 8.8   CBC:  Recent Labs Lab 01/05/13 1505 01/06/13 0350 01/08/13 0510  WBC 8.5 23.7* 7.9  NEUTROABS 8.1*  --   --   HGB 15.0 13.1 11.6*  HCT 44.2 39.0 33.7*  MCV 90.4 92.0 89.4  PLT 125*  109* 93*   Cardiac Enzymes:  Recent Labs Lab 01/06/13 0610 01/06/13 1158 01/07/13 0225 01/07/13 1102  CKTOTAL  --  400* 258* 207  CKMB  --  5.2* 3.7 2.8  TROPONINI 0.35* <0.30 <0.30  --     Recent Results (from the past 240 hour(s))  URINE CULTURE     Status: None   Collection Time    01/05/13  1:51 PM      Result Value Range Status   Specimen Description URINE, CLEAN CATCH   Final   Special Requests NONE   Final   Culture  Setup Time 01/05/2013 22:00   Final   Colony Count >=100,000 COLONIES/ML   Final   Culture ESCHERICHIA COLI   Final   Report Status 01/07/2013 FINAL   Final   Organism ID, Bacteria ESCHERICHIA COLI   Final  CULTURE, BLOOD (ROUTINE X 2)     Status: None   Collection Time    01/06/13 10:14 AM      Result Value Range Status   Specimen Description BLOOD RIGHT HAND   Final   Special Requests BOTTLES DRAWN AEROBIC ONLY 5CC   Final   Culture  Setup Time 01/06/2013 14:03   Final   Culture     Final   Value:        BLOOD CULTURE RECEIVED NO GROWTH TO DATE CULTURE WILL BE HELD FOR 5 DAYS BEFORE ISSUING A FINAL NEGATIVE REPORT   Report Status PENDING   Incomplete  CULTURE, BLOOD (ROUTINE X 2)     Status: None   Collection Time    01/06/13 10:18 AM      Result Value Range Status   Specimen Description BLOOD RIGHT HAND   Final   Special Requests BOTTLES DRAWN AEROBIC AND ANAEROBIC [facility]   Final   Culture  Setup Time 01/06/2013 14:03   Final   Culture     Final   Value:        BLOOD CULTURE RECEIVED NO GROWTH TO DATE CULTURE WILL BE HELD FOR 5 DAYS BEFORE ISSUING A FINAL NEGATIVE REPORT   Report Status PENDING   Incomplete  MRSA PCR SCREENING     Status: Abnormal   Collection Time    01/06/13  2:14 PM      Result Value Range Status   MRSA by PCR POSITIVE (*) NEGATIVE Final   Comment:            The GeneXpert MRSA Assay (FDA     approved for NASAL specimens     only), is one component of a     comprehensive MRSA colonization     surveillance program. It is  not     intended to diagnose MRSA     infection nor to guide or     monitor treatment for     MRSA infections.     RESULT CALLED TO, READ BACK BY AND VERIFIED WITH:     Burtis Junes RN 16:15 01/06/13 ([email or web address])     Studies:  Recent x-ray studies have been reviewed in detail by the Attending Physician  Scheduled Meds:  Reviewed in detail by the Attending Physician   Clint Lipps Pager: [phone] 01/09/2013, 10:32 AM

## 2013-01-10 ENCOUNTER — Inpatient Hospital Stay (HOSPITAL_COMMUNITY): Payer: Medicare Other

## 2013-01-10 LAB — CBC
Hemoglobin: 12.6 g/dL — ABNORMAL LOW (ref 13.0–17.0)
MCV: 88.3 fL (ref 78.0–100.0)
Platelets: 129 10*3/uL — ABNORMAL LOW (ref 150–400)
RBC: 4.19 MIL/uL — ABNORMAL LOW (ref 4.22–5.81)
WBC: 6.4 10*3/uL (ref 4.0–10.5)

## 2013-01-10 LAB — BASIC METABOLIC PANEL
CO2: 22 mEq/L (ref 19–32)
Calcium: 9.2 mg/dL (ref 8.4–10.5)
GFR calc non Af Amer: 90 mL/min — ABNORMAL LOW (ref >90)
Potassium: 3.5 mEq/L (ref 3.5–5.1)
Sodium: 144 mEq/L (ref 135–145)

## 2013-01-10 MED ORDER — REGADENOSON 0.4 MG/5ML IV SOLN
INTRAVENOUS | Status: AC
  Administered 2013-01-10: 0.4 mg
  Filled 2013-01-10: qty 5

## 2013-01-10 MED ORDER — TECHNETIUM TC 99M SESTAMIBI GENERIC - CARDIOLITE
30.0000 | Freq: Once | INTRAVENOUS | Status: AC | PRN
  Administered 2013-01-10: 30 via INTRAVENOUS

## 2013-01-10 MED ORDER — POTASSIUM CHLORIDE CRYS ER 20 MEQ PO TBCR
60.0000 meq | EXTENDED_RELEASE_TABLET | Freq: Once | ORAL | Status: AC
  Administered 2013-01-10: 60 meq via ORAL
  Filled 2013-01-10: qty 3

## 2013-01-10 MED ORDER — LORAZEPAM 0.5 MG PO TABS: 0.5000 mg | ORAL_TABLET | Freq: Every day | ORAL | Status: DC

## 2013-01-10 MED ORDER — CEFUROXIME AXETIL 500 MG PO TABS: 500.0000 mg | ORAL_TABLET | Freq: Two times a day (BID) | ORAL | Status: DC

## 2013-01-10 MED ORDER — ASPIRIN 81 MG PO CHEW: 81.0000 mg | CHEWABLE_TABLET | Freq: Every day | ORAL | Status: DC

## 2013-01-10 MED ORDER — LORAZEPAM 0.5 MG PO TABS
0.5000 mg | ORAL_TABLET | Freq: Every day | ORAL | Status: DC
  Administered 2013-01-10: 0.5 mg via ORAL
  Filled 2013-01-10: qty 1

## 2013-01-10 NOTE — Care Management Note (Addendum)
    Page 1 of 2   01/11/2013     10:50:50 AM   CARE MANAGEMENT NOTE 01/11/2013  Patient:  Patrick Orozco, Patrick Orozco   Account Number:  [id number]  Date Initiated:  01/06/2013  Documentation initiated by:  DAVIS,TYMEEKA  Subjective/Objective Assessment:   57 yo male admitted with pyelonephritis.  admit- lives alone.     Action/Plan:   Anticipated DC Date:  01/11/2013   Anticipated DC Plan:  HOME W HOME HEALTH SERVICES      DC Planning Services  CM consult      [facility] Choice  HOME HEALTH   Choice offered to / List presented to:  C-1 Patient        HH arranged  HH-1 RN  HH-2 PT  HH-3 OT  HH-6 SOCIAL WORKER  HH-4 NURSE'S AIDE      HH agency  [facility]   Status of service:  Completed, signed off Medicare Important Message given?   (If response is "NO", the following Medicare IM given date fields will be blank) Date Medicare IM given:   Date Additional Medicare IM given:    Discharge Disposition:  HOME W HOME HEALTH SERVICES  Per UR Regulation:  Reviewed for med. necessity/level of care/duration of stay  If discussed at Long Length of Stay Meetings, dates discussed:    Comments:  01/11/13 10:49 Letha Cape RN, BSN [phone] patient for dc today, notified [name] with [facility] by vm.  01/10/13 11:26 Letha Cape RN, BSN [phone] patient for 2nd part of stress test today, patient chose [place] from agency list for [facility], aide, pt/ot.  Referral made to Elizebeth Koller notified.  Soc will begin 24-48 hrs post discharge.  Patient states he has insurance for medication coverage and he has transportation.  He uses a power w/chair  infep at home and he lives alone.  Patient states his mother will come over to help him go to restroom,  I also inform patient to contact his PCP's office to get him signed up for caps since he has medicaid secondary.  I will also give him a private duty list.  01/06/13 Leonie Green [phone]

## 2013-01-10 NOTE — Progress Notes (Signed)
Rehab Admissions Coordinator Note:  Patient was screened by Trish Mage for appropriateness for an Inpatient Acute Rehab Consult.  Noted PT recommending CIR.   At this time, we are recommending Inpatient Rehab consult.  Lelon Frohlich M 01/10/2013, 11:11 AM  I can be reached at [phone].

## 2013-01-10 NOTE — Evaluation (Signed)
Physical Therapy Evaluation Patient Details Name: Patrick Orozco MRN: [id number] DOB: [birth year] Today's Date: 01/10/2013 Time: 0932-1005 PT Time Calculation (min): 33 min  PT Assessment / Plan / Recommendation Clinical Impression  57 y.o. male with h/o of spina bifida who was independent at home alone with WC and crutches admitted with pyelonephritis. Pt now requires assist for pivot transfer to recliner and would benefit from acute PT then CIR to return to baseline.     PT Assessment  Patient needs continued PT services    Follow Up Recommendations  CIR;Supervision for mobility/OOB    Does the patient have the potential to tolerate intense rehabilitation      Barriers to Discharge Decreased caregiver support family available intermittently    Equipment Recommendations  None recommended by PT    Recommendations for Other Services OT consult;Rehab consult   Frequency Min 3X/week    Precautions / Restrictions Precautions Precautions: Fall Restrictions Weight Bearing Restrictions: No   Pertinent Vitals/Pain *pt denies pain**      Mobility  Bed Mobility Bed Mobility: Supine to Sit Supine to Sit: 6: Modified independent (Device/Increase time);With rails Transfers Transfers: Stand Pivot Transfers Stand Pivot Transfers: With armrests;1: +2 Total assist Stand Pivot Transfers: Patient Percentage: 70% Details for Transfer Assistance: bed to recliner (with seat built up with folded blankets)  pivot transfer with heavy use of BUEs on armrest/bedrail, +2 for safety, to pivot, and to scoot to back of recliner Ambulation/Gait Ambulation/Gait Assistance: Not tested (comment)    Exercises     PT Diagnosis: Generalized weakness;Difficulty walking  PT Problem List: Decreased mobility;Impaired sensation;Decreased strength PT Treatment Interventions: Gait training;Functional mobility training   PT Goals Acute Rehab PT Goals PT Goal Formulation: With patient Time For Goal  Achievement: 01/24/13 Potential to Achieve Goals: Good Pt will Transfer Bed to Chair/Chair to Bed: with modified independence PT Transfer Goal: Bed to Chair/Chair to Bed - Progress: Goal set today Pt will Ambulate: 1 - 15 feet PT Goal: Ambulate - Progress: Goal set today  Visit Information  Last PT Received On: 01/10/13 Assistance Needed: +2    Subjective Data  Subjective: That might be a good idea. (going to rehab Patient Stated Goal: return to living independently at home   Prior Functioning  Home Living Lives With: Alone Available Help at Discharge: Family;Available PRN/intermittently Type of Home: Apartment Home Access: Ramped entrance Home Layout: One level Bathroom Shower/Tub: Tub/shower unit (pt gets into tub to bathe, uses grab bars) Bathroom Toilet: Standard Home Adaptive Equipment: Wheelchair - powered;Wheelchair - manual;Crutches;Shower chair with back;Grab bars in shower Prior Function Level of Independence: Independent with assistive device(s) Able to Take Stairs?: No Driving: No (uses SCAT) Comments: used power WC, used crutches to walk into bathroom since WC doesn't fit Communication Communication: No difficulties    Cognition  Cognition Overall Cognitive Status: Appears within functional limits for tasks assessed/performed Arousal/Alertness: Awake/alert Orientation Level: Appears intact for tasks assessed Behavior During Session: [facility] for tasks performed    Extremity/Trunk Assessment Right Upper Extremity Assessment RUE ROM/Strength/Tone: Within functional levels RUE Sensation: WFL - Light Touch RUE Coordination: WFL - gross/fine motor Left Upper Extremity Assessment LUE ROM/Strength/Tone: Within functional levels LUE Sensation: WFL - Light Touch LUE Coordination: WFL - gross/fine motor Right Lower Extremity Assessment RLE ROM/Strength/Tone: Deficits RLE ROM/Strength/Tone Deficits: ankle 0/5, knee ext 4/5 RLE Sensation: Deficits RLE Sensation  Deficits: nearly absent to light touch, this is baseline Left Lower Extremity Assessment LLE ROM/Strength/Tone: Deficits LLE ROM/Strength/Tone Deficits: ankle 0/5, knee ext  4/5 LLE Sensation: Deficits LLE Sensation Deficits: nearly absent to light touch, this is baseline Trunk Assessment Trunk Assessment: Normal   Balance Balance Balance Assessed: Yes Static Sitting Balance Static Sitting - Balance Support: Feet unsupported;Bilateral upper extremity supported Static Sitting - Level of Assistance: 6: Modified independent (Device/Increase time) Static Sitting - Comment/# of Minutes: 4  End of Session PT - End of Session Equipment Utilized During Treatment: Gait belt Activity Tolerance: Patient tolerated treatment well Patient left: in chair;with call bell/phone within reach Nurse Communication: Mobility status  GP     Ralene Bathe Kistler 01/10/2013, 11:04 AM [phone]

## 2013-01-10 NOTE — Clinical Social Work Note (Signed)
CSW received consult for SNF. Per PT notes, patient will go home with PT or CIR. Patient stated he does not want SNF.  CSW signing off, no other psychosocial concerns identified. Please re-consult as needed.  Lia Foyer, LCSWA [facility] Clinical Social Worker Contact #: [phone]

## 2013-01-10 NOTE — Progress Notes (Signed)
Physical Therapy Treatment Patient Details Name: Patrick Orozco MRN: [id number] DOB: 10-05-1956 Today's Date: 01/10/2013 Time: 4098-1191 PT Time Calculation (min): 25 min  PT Assessment / Plan / Recommendation Comments on Treatment Session  Attempted sit to stand with crutches x 2 trials with +2 assist, pt unable to fully stand, unable to ambulate. Pt stated his mother can come stay with him at home, that he doesn't have to be able to walk into bathroom, his mother can assist with draining catheter and ostomy bags. Spoke with CIR coordinator Roderic Palau who stated no CIR beds available. ST-SNF vs. HHPT are pt's options. Pt refusing SNF.     Follow Up Recommendations  CIR;Supervision for mobility/OOB;SNF;Home health PT     Does the patient have the potential to tolerate intense rehabilitation     Barriers to Discharge Decreased caregiver support family available intermittently    Equipment Recommendations  None recommended by PT    Recommendations for Other Services OT consult;Rehab consult  Frequency Min 3X/week   Plan      Precautions / Restrictions Precautions Precautions: Fall Restrictions Weight Bearing Restrictions: No   Pertinent Vitals/Pain **0/10 pain*    Mobility  Bed Mobility Bed Mobility: Supine to Sit Supine to Sit: 6: Modified independent (Device/Increase time);With rails Transfers Transfers: Sit to Stand;Stand to Sit Sit to Stand: 1: +2 Total assist Sit to Stand: Patient Percentage: 60% Stand to Sit: 1: +2 Total assist Stand to Sit: Patient Percentage: 60% Stand Pivot Transfers: With armrests;1: +2 Total assist Stand Pivot Transfers: Patient Percentage: 70% Details for Transfer Assistance: attempted sit to stand x 2 trials from recliner using Bilateral axillary crutches. Pt unable to achieve full upright position 2* weakness. Assist required to elevate trunk and to guide hips back into recliner to sit.  Ambulation/Gait Ambulation/Gait Assistance: Not  tested (comment)    Exercises     PT Diagnosis: Generalized weakness;Difficulty walking  PT Problem List: Decreased mobility;Impaired sensation;Decreased strength PT Treatment Interventions: Gait training;Functional mobility training   PT Goals Acute Rehab PT Goals PT Goal Formulation: With patient Time For Goal Achievement: 01/24/13 Potential to Achieve Goals: Good Pt will Transfer Bed to Chair/Chair to Bed: with modified independence PT Transfer Goal: Bed to Chair/Chair to Bed - Progress: Progressing toward goal Pt will Ambulate: 1 - 15 feet PT Goal: Ambulate - Progress: Goal set today  Visit Information  Last PT Received On: 01/10/13 Assistance Needed: +2    Subjective Data  Subjective: My mom can come stay with me.  Patient Stated Goal: return to living independently at home   Cognition  Cognition Overall Cognitive Status: Appears within functional limits for tasks assessed/performed Arousal/Alertness: Awake/alert Orientation Level: Appears intact for tasks assessed Behavior During Session: [facility] for tasks performed    Balance  Balance Balance Assessed: Yes Static Sitting Balance Static Sitting - Balance Support: Feet unsupported;Bilateral upper extremity supported Static Sitting - Level of Assistance: 6: Modified independent (Device/Increase time) Static Sitting - Comment/# of Minutes: 4  End of Session PT - End of Session Equipment Utilized During Treatment: Gait belt Activity Tolerance: Patient tolerated treatment well Patient left: in chair;with call bell/phone within reach Nurse Communication: Mobility status   GP     Ralene Bathe Kistler 01/10/2013, 11:57 AM [phone]

## 2013-01-10 NOTE — Progress Notes (Signed)
Patient seems very depressed, and continuously talks about the loss of his wife nine months ago.  He does not seem to be coping well with the loss.  He is requesting a chaplain visit perhaps today.  Will continue to monitor patient.

## 2013-01-10 NOTE — Progress Notes (Signed)
[facility] Progress Note    Patrick Orozco YNW:[id number] DOB: 04-18-1956 DOA: 01/05/2013 PCP: No primary provider on file.  HPI/Subjective: Denies any chest pain or shortness of breath, scheduled for the stress portion of the stress test today. He was in the nuclear medicine suite all day, probably can be discharged in the morning if stress test is negative   Brief narrative: 57 year old man who presented to the hospital due to the abrupt onset of fever associated with nausea and vomiting as well as generalized abdominal pain radiating from the left flank. Urinalysis was consistent with a probable urinary tract infection and CT abdomen and pelvis revealed likely recent passage of renal calculi. Patient endorsed remote history of nephrolithiasis. In the Emergency department his temperature was 102.8 initial blood pressure was 97 while a followup was 143 systolic. Platelets were low at 125,000. Chest x-ray was negative for acute process. CT of the abdomen revealed left pelvicaliectasis and hydroureter without obstructing calculus likely representative of sequela of passed calculus.  Assessment/Plan:  Acute pyelonephritis / UTI (urinary tract infection) -Likely due to recurrent renal calculi currently nonobstructive -White count markedly elevated 23,000-repeat labs in a.m. -Escherichia coli acute pyelonephritis, resistance to fluoroquinolones noted. -cont hydration w/ IVF's  Sepsis -Secondary to UTI/acute pyelonephritis. -Upon presentation pulse was 104 blood pressure 88/57. -Vitals is back to baseline now.  Thrombocytopenia -due to GNR infection- follow  Hypertension -BP controlled -cont. Lopressor  Atypical chest pain /Cardiac enzymes elevated -only VERY subtle increase in TNI  -EKG unremarkable -CP not reproducible with palpation but also no "classic" sx's -appreciate Cards assistance- add'l evaluation ?OP at their discretion -ECHO w/ preserved EF but possible mild  hypokinesis of lateral myocardium -dc Heparin infusion -suspect mild demand ischemia from SIRS/pyelo w/ dehydration -For stress test today.    Recurrent kidney stones-non obstructive -has seen Urologist remotely- will need OP follow up -current stones non obstructive  History of spina bifida/H/O paraplegia -has diverting colostomy and lives at home  Lung mass- RUL 3 cm -remote smoking history -3 cm RUL lesion, will obtain CT scan with contrast today.   DVT prophylaxis: Lovenox  Code Status:  FULL Family Communication:  patient Disposition Plan: Remain as inpatient, telemetry  Consultants: Cardiology - Dr. Sharyn Lull  Procedures: None   Antibiotics: Rocephin 3/5 >>>    Objective: Blood pressure 125/63, pulse 85, temperature 98.2 F (36.8 C), temperature source Oral, resp. rate 18, height 5\' 4"  (1.626 m), weight 130.4 kg (287 lb 7.7 oz), SpO2 94.00%.  Intake/Output Summary (Last 24 hours) at 01/10/13 1539 Last data filed at 01/10/13 1500  Gross per 24 hour  Intake 2617.08 ml  Output   2700 ml  Net -82.92 ml    Exam: General: No acute respiratory distress Lungs: Clear to auscultation bilaterally without wheezes or crackles Cardiovascular: Regular rate and rhythm without murmur gallop or rub normal S1 and S2 Abdomen: Nontender, nondistended, soft, bowel sounds positive, no rebound, no ascites, no appreciable mass Extremities: No significant cyanosis, clubbing, or edema bilateral lower extremities Neurological: Alert and oriented x3, moves upper extremities easily with strength being 4/5 bilateral, lower extremities flaccid   Data Reviewed: Basic Metabolic Panel:  Recent Labs Lab 01/05/13 1505 01/06/13 0350 01/07/13 0225 01/08/13 0510 01/10/13 0715  NA 141 139 145 144 144  K 4.1 4.2 4.2 3.8 3.5  CL 108 109 109 113* 111  CO2 17* 20 20 22 22   GLUCOSE 106* 90 58* 90 78  BUN 20 19 16 13 15   CREATININE  1.17 1.16 1.13 1.01 0.98  CALCIUM 9.0 8.6 8.9 8.8 9.2    CBC:  Recent Labs Lab 01/05/13 1505 01/06/13 0350 01/08/13 0510 01/10/13 0715  WBC 8.5 23.7* 7.9 6.4  NEUTROABS 8.1*  --   --   --   HGB 15.0 13.1 11.6* 12.6*  HCT 44.2 39.0 33.7* 37.0*  MCV 90.4 92.0 89.4 88.3  PLT 125* 109* 93* 129*   Cardiac Enzymes:  Recent Labs Lab 01/06/13 0610 01/06/13 1158 01/07/13 0225 01/07/13 1102  CKTOTAL  --  400* 258* 207  CKMB  --  5.2* 3.7 2.8  TROPONINI 0.35* <0.30 <0.30  --     Recent Results (from the past 240 hour(s))  URINE CULTURE     Status: None   Collection Time    01/05/13  1:51 PM      Result Value Range Status   Specimen Description URINE, CLEAN CATCH   Final   Special Requests NONE   Final   Culture  Setup Time 01/05/2013 22:00   Final   Colony Count >=100,000 COLONIES/ML   Final   Culture ESCHERICHIA COLI   Final   Report Status 01/07/2013 FINAL   Final   Organism ID, Bacteria ESCHERICHIA COLI   Final  CULTURE, BLOOD (ROUTINE X 2)     Status: None   Collection Time    01/06/13 10:14 AM      Result Value Range Status   Specimen Description BLOOD RIGHT HAND   Final   Special Requests BOTTLES DRAWN AEROBIC ONLY 5CC   Final   Culture  Setup Time 01/06/2013 14:03   Final   Culture     Final   Value:        BLOOD CULTURE RECEIVED NO GROWTH TO DATE CULTURE WILL BE HELD FOR 5 DAYS BEFORE ISSUING A FINAL NEGATIVE REPORT   Report Status PENDING   Incomplete  CULTURE, BLOOD (ROUTINE X 2)     Status: None   Collection Time    01/06/13 10:18 AM      Result Value Range Status   Specimen Description BLOOD RIGHT HAND   Final   Special Requests BOTTLES DRAWN AEROBIC AND ANAEROBIC 6CC   Final   Culture  Setup Time 01/06/2013 14:03   Final   Culture     Final   Value:        BLOOD CULTURE RECEIVED NO GROWTH TO DATE CULTURE WILL BE HELD FOR 5 DAYS BEFORE ISSUING A FINAL NEGATIVE REPORT   Report Status PENDING   Incomplete  MRSA PCR SCREENING     Status: Abnormal   Collection Time    01/06/13  2:14 PM      Result Value Range  Status   MRSA by PCR POSITIVE (*) NEGATIVE Final   Comment:            The GeneXpert MRSA Assay (FDA     approved for NASAL specimens     only), is one component of a     comprehensive MRSA colonization     surveillance program. It is not     intended to diagnose MRSA     infection nor to guide or     monitor treatment for     MRSA infections.     RESULT CALLED TO, READ BACK BY AND VERIFIED WITH:     Burtis Junes RN 16:15 01/06/13 ([email or web address])     Studies:  Recent x-ray studies have been reviewed in detail by the Attending Physician  Scheduled Meds:  Reviewed in detail by the Attending Physician   Clint Lipps Pager: [phone] 01/10/2013, 3:39 PM

## 2013-01-10 NOTE — Evaluation (Signed)
Occupational Therapy Evaluation Patient Details Name: Patrick Orozco MRN: [id number] DOB: 04-29-1956 Today's Date: 01/10/2013 Time: 4098-1191 OT Time Calculation (min): 27 min  OT Assessment / Plan / Recommendation Clinical Impression  This 57 yo male admitted with vomiting and thought to have had an MI presents to acute OT with problems below. Will benefit from acute OT with follow up on CIR. Feel pt can adequately get back to his baseline of Mod I with CIR without need for anyone to A him at home.    OT Assessment  Patient needs continued OT Services    Follow Up Recommendations  CIR    Barriers to Discharge Decreased caregiver support    Equipment Recommendations  None recommended by OT       Frequency  Min 2X/week    Precautions / Restrictions Precautions Precautions: Fall Restrictions Weight Bearing Restrictions: No       ADL  Eating/Feeding: Simulated;Independent Where Assessed - Eating/Feeding: Edge of bed Grooming: Simulated;Set up Where Assessed - Grooming: Unsupported sitting Upper Body Bathing: Simulated;Set up Where Assessed - Upper Body Bathing: Unsupported sitting Lower Body Bathing: Simulated;Minimal assistance Where Assessed - Lower Body Bathing: Lean right and/or left;Unsupported sitting Upper Body Dressing: Simulated;Set up Where Assessed - Upper Body Dressing: Unsupported sitting Lower Body Dressing: Simulated;Minimal assistance Where Assessed - Lower Body Dressing: Unsupported sitting;Lean right and/or left Toilet Transfer: Simulated;+2 Total assistance (Bed to recliner next to bed going to pt's left) Toilet Transfer: Patient Percentage: 70% Toilet Transfer Method: Stand pivot Equipment Used: Gait belt Transfers/Ambulation Related to ADLs: Total A +2 (pt=70%) stand pivot from bed to recliner ADL Comments: Pt stated that he could not put on socks here due to the way this bed was, pt has colostomy and used condom cath at home    OT Diagnosis:  Generalized weakness  OT Problem List: Decreased strength;Impaired balance (sitting and/or standing) OT Treatment Interventions: Self-care/ADL training;Patient/family education;Balance training   OT Goals Acute Rehab OT Goals OT Goal Formulation: With patient Time For Goal Achievement: 01/17/13 Potential to Achieve Goals: Good Miscellaneous OT Goals Miscellaneous OT Goal #1: Pt will be able to get up for flat bed without rail independently for BADLs OT Goal: Miscellaneous Goal #1 - Progress: Goal set today Miscellaneous OT Goal #2: Pt will be S for transfers to from 2 surfaces (bed to recliner, recliner to 3n1, etc..) OT Goal: Miscellaneous Goal #2 - Progress: Goal set today Miscellaneous OT Goal #3: Pt will be setup to get LBB/D from any level with AE prn OT Goal: Miscellaneous Goal #3 - Progress: Goal set today  Visit Information  Last OT Received On: 01/10/13 Assistance Needed: +2    Subjective Data  Subjective: I'm a little weaker than normal   Prior Functioning     Home Living Lives With: Alone Available Help at Discharge: Available PRN/intermittently (family lives out of town) Type of Home: Apartment Home Access: Ramped entrance Home Layout: One level Bathroom Shower/Tub: Engineer, manufacturing systems: Standard Home Adaptive Equipment: Wheelchair - powered;Wheelchair - manual;Crutches;Shower chair with back;Grab bars in shower Prior Function Level of Independence: Independent with assistive device(s) Able to Take Stairs?: No Driving: No Comments: uses power WC, uses crutches to walk into bathroom since WC does not fit Communication Communication: No difficulties Dominant Hand: Right         Vision/Perception Vision - History Baseline Vision: Wears glasses for distance only Patient Visual Report: No change from baseline   Cognition  Cognition Overall Cognitive Status: Appears within functional  limits for tasks assessed/performed Arousal/Alertness:  Awake/alert Orientation Level: Appears intact for tasks assessed Behavior During Session: [facility] for tasks performed    Extremity/Trunk Assessment Right Upper Extremity Assessment RUE ROM/Strength/Tone: Within functional levels RUE Sensation: WFL - Light Touch RUE Coordination: WFL - gross/fine motor Left Upper Extremity Assessment LUE ROM/Strength/Tone: Within functional levels LUE Sensation: WFL - Light Touch LUE Coordination: WFL - gross/fine motor Right Lower Extremity Assessment RLE ROM/Strength/Tone: Deficits RLE ROM/Strength/Tone Deficits: ankle 0/5, knee ext 4/5 RLE Sensation: Deficits RLE Sensation Deficits: nearly absent to light touch, this is baseline Left Lower Extremity Assessment LLE ROM/Strength/Tone: Deficits LLE ROM/Strength/Tone Deficits: ankle 0/5, knee ext 4/5 LLE Sensation: Deficits LLE Sensation Deficits: nearly absent to light touch, this is baseline Trunk Assessment Trunk Assessment: Normal     Mobility Bed Mobility Bed Mobility: Supine to Sit Supine to Sit: 6: Modified independent (Device/Increase time);With rails Transfers Details for Transfer Assistance: bed to recliner (with seat built up with folded blankets)  pivot transfer with heavy use of BUEs on armrest/bedrail, +2 for safety, to pivot, and to scoot to back of recliner        Balance Balance Balance Assessed: Yes Static Sitting Balance Static Sitting - Balance Support: Feet unsupported;Bilateral upper extremity supported Static Sitting - Level of Assistance: 6: Modified independent (Device/Increase time) Static Sitting - Comment/# of Minutes: 4   End of Session OT - End of Session Activity Tolerance: Patient tolerated treatment well Patient left: in chair;with call bell/phone within reach Nurse Communication: Mobility status (+2 back to bed with recliner facing back wall)       Evette Georges [phone] 01/10/2013, 11:22 AM

## 2013-01-10 NOTE — Progress Notes (Signed)
Subjective:  No further chest pain or shortness of breath. Scheduled for stress portion of the nuclear stress test today  Objective:  Vital Signs in the last 24 hours: Temp:  [97.6 F (36.4 C)-98.2 F (36.8 C)] 98.2 F (36.8 C) (03/10 0447) Pulse Rate:  [64-69] 64 (03/10 0447) Resp:  [18-20] 18 (03/10 0447) BP: (144-161)/(78-91) 152/78 mmHg (03/10 0447) SpO2:  [94 %-95 %] 94 % (03/10 0447)  Intake/Output from previous day: 03/09 0701 - 03/10 0700 In: 3857.1 [P.O.:480; I.V.:3377.1] Out: 4550 [Urine:4150; Stool:400] Intake/Output from this shift:    Physical Exam: Exam unchanged  Lab Results:  Recent Labs  01/08/13 0510 01/10/13 0715  WBC 7.9 6.4  HGB 11.6* 12.6*  PLT 93* 129*    Recent Labs  01/08/13 0510 01/10/13 0715  NA 144 144  K 3.8 3.5  CL 113* 111  CO2 22 22  GLUCOSE 90 78  BUN 13 15  CREATININE 1.01 0.98   No results found for this basename: TROPONINI, CK, MB,  in the last 72 hours Hepatic Function Panel No results found for this basename: PROT, ALBUMIN, AST, ALT, ALKPHOS, BILITOT, BILIDIR, IBILI,  in the last 72 hours No results found for this basename: CHOL,  in the last 72 hours No results found for this basename: PROTIME,  in the last 72 hours  Imaging: Imaging results have been reviewed and No results found.  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

## 2013-01-11 LAB — BASIC METABOLIC PANEL
CO2: 21 mEq/L (ref 19–32)
Chloride: 112 mEq/L (ref 96–112)
Glucose, Bld: 91 mg/dL (ref 70–99)
Sodium: 142 mEq/L (ref 135–145)

## 2013-01-11 NOTE — Progress Notes (Signed)
NURSING PROGRESS NOTE  AROLDO GALLI [id number] Discharge Data: 01/11/2013 11:19 AM Attending Provider: Clydia Llano, MD PCP:No primary provider on file.     Azad T Hellinger to be D/C'd Home per MD order.  Discussed with the patient the After Visit Summary and all questions fully answered. All IV's discontinued with no bleeding noted. All belongings returned to patient for patient to take home.   Last Vital Signs:  Blood pressure 136/87, pulse 69, temperature 97.3 F (36.3 C), temperature source Oral, resp. rate 18, height 5\' 4"  (1.626 m), weight 130.4 kg (287 lb 7.7 oz), SpO2 95.00%.  Discharge Medication List   Medication List    TAKE these medications       aspirin 81 MG chewable tablet  Chew 1 tablet (81 mg total) by mouth daily.     cefUROXime 500 MG tablet  Commonly known as:  CEFTIN  Take 1 tablet (500 mg total) by mouth 2 (two) times daily.     LORazepam 0.5 MG tablet  Commonly known as:  ATIVAN  Take 1 tablet (0.5 mg total) by mouth at bedtime.     metoprolol succinate 50 MG 24 hr tablet  Commonly known as:  TOPROL-XL  Take 50 mg by mouth daily.     simvastatin 40 MG tablet  Commonly known as:  ZOCOR  Take 40 mg by mouth at bedtime.        Whitaker, Elmarie Mainland, RN

## 2013-01-11 NOTE — Discharge Summary (Signed)
Physician Discharge Summary  Patrick Orozco JXB:[id number] DOB: 12/20/1995 DOA: 01/05/2013  PCP: Karie Chimera, MD  Admit date: 01/05/2013 Discharge date: 01/11/2013  Time spent: 40 minutes  Recommendations for Outpatient Follow-up:  1. Patient will need followup for right upper lobe lung mass. He is scheduled to see Dr. Haskel Schroeder 2. Urology followup for recurrent urinary tract infections with kidney stones is scheduled with Dr. Berneice Heinrich  Discharge Diagnoses:  Active Problems:   Acute pyelonephritis   Thrombocytopenia   Hypertension   History of spina bifida   UTI (urinary tract infection), bacterial 2/2 GNR's   Cardiac enzymes elevated   Atypical chest pain   H/O paraplegia   Recurrent kidney stones-non obstructive   Lung mass- RUL 3 cm   Discharge Condition: much improved, pain relieved, afebrile  Diet recommendation:  heart healthy Filed Weights   01/05/13 2135 01/06/13 1416  Weight: 113.399 kg (250 lb) 130.4 kg (287 lb 7.7 oz)    History of present illness:  Patrick Orozco is a 57 year old gentleman with spina bifida and paraplegia, as well as hypertension hyperlipidemia and a history of nephrolithiasis. He presented to the emergency department with acute onset of fever nausea vomiting generalized abdominal pain and left flank pain. Evaluation in the emergency department was notable for urinary tract infections and CT findings suggesting a recent kidney stone. His temperature was 102.8. CT abdomen and pelvis revealed left pelvicaliectasis and hydroureter without obstructing calculus this was felt to be the sequelae of past calculus.  Hospital Course:  Acute pyelonephritis / UTI (urinary tract infection)  Likely due to recurrent renal calculi currently nonobstructive.  White count markedly elevated 23,000, but has since normalized. Cultures showed Escherichia coli acute pyelonephritis, resistance to fluoroquinolones noted.  He was treated with IV Rocephin during his hospital stay  and will be discharged on Ceftin 500 mg bid for 5 more days.   Sepsis  Secondary to UTI/acute pyelonephritis.  Upon presentation pulse was 104 blood pressure 88/57.  This resolved with IV fluids and antibiotic therapy.   .  Thrombocytopenia  Due to GNR infection.  Platelets trending up at the time of discharge.  Hypertension  BP controlled.  Continue lopressor.  Atypical chest pain /Cardiac enzymes elevated  The patient's chest pain was not reproducible and he had no classic symptoms of ACS. His troponin level was only very subtly increased. His EKG was unremarkable. Dr. Sharyn Lull consulted and ordered a nuclear medicine stress test the results of which are listed below. Doubt ACS, rather suspect mild demand ischemia from SIRS/pyelo w/ dehydration. -ECHO w/ preserved EF but possible mild hypokinesis of lateral myocardium   Recurrent kidney stones-non obstructive  He has a history of problems with nephrolithiasis and recurrent urinary tract infections. He has been scheduled to see Dr. Sebastian Ache of urology as an outpatient.  History of spina bifida/H/O paraplegia  -has diverting colostomy and lives at home alone.  Wife passed 7 months ago.  Despite PT/OT recommendations the patient refused SNF and desires to be discharged to home.  He will be sent with Home Health PT, OT, RN, A., social worker.  Lung mass- RUL 3 cm  -remote smoking history  -3 cm RUL lesion, will obtain CT scan with contrast today.    Procedures:  Nuclear medicine stress test  IMPRESSION:  Suspected anterior wall scar along the cardiac apex. No inducible ischemia noted. Left ventricular ejection fraction calculated at 69%.   2D Echocardiogram Study Conclusions  - Left ventricle: The cavity size was  normal. Systolic function was normal. The estimated ejection fraction was in the range of 50% to 55%. Possible mild hypokinesis of the lateral myocardium. - Atrial septum: No defect or patent foramen ovale was  identified.   Discharge Exam: Filed Vitals:   01/10/13 1305 01/10/13 1307 01/10/13 2108 01/11/13 0550  BP: 123/64 125/63 147/84 136/87  Pulse: 88 85 70 69  Temp:   97.8 F (36.6 C) 97.3 F (36.3 C)  TempSrc:   Oral Oral  Resp:   20 18  Height:      Weight:      SpO2:   95% 95%    General: A&O, NAD, sitting up in bed.  Appears well. Cardiovascular: rrr. No m/r/g Respiratory: cta, no w/c/r Abdomen:  Soft, nt, nd, +BS, no mass Extremities:  Upper ext appear very developed.  Lower ext. Diminished minimal use.  No swelling.   Discharge Instructions      Discharge Orders   Future Orders Complete By Expires     Diet - low sodium heart healthy  As directed     Increase activity slowly  As directed         Medication List    TAKE these medications       aspirin 81 MG chewable tablet  Chew 1 tablet (81 mg total) by mouth daily.     cefUROXime 500 MG tablet  Commonly known as:  CEFTIN  Take 1 tablet (500 mg total) by mouth 2 (two) times daily.     LORazepam 0.5 MG tablet  Commonly known as:  ATIVAN  Take 1 tablet (0.5 mg total) by mouth at bedtime.     metoprolol succinate 50 MG 24 hr tablet  Commonly known as:  TOPROL-XL  Take 50 mg by mouth daily.     simvastatin 40 MG tablet  Commonly known as:  ZOCOR  Take 40 mg by mouth at bedtime.       Follow-up Information   Follow up with Sebastian Ache, MD On 02/07/2013. (9:00- urology-, please bring insurance cards, photo id and medications)    Contact information:   [address] [place] [address] [phone]       Follow up with Karie Chimera, MD On 01/19/2013. (PCP-10:15)    Contact information:   [address] [place] [address] [phone]        The results of significant diagnostics from this hospitalization (including imaging, microbiology, ancillary and laboratory) are listed below for reference.    Significant Diagnostic Studies: Ct Abdomen Pelvis Wo  Contrast  01/05/2013  *RADIOLOGY REPORT*  Clinical Data: Abdominal pain and fever  CT ABDOMEN AND PELVIS WITHOUT CONTRAST  Technique:  Multidetector CT imaging of the abdomen and pelvis was performed following the standard protocol without intravenous contrast.  Comparison: The CT abdomen 04/10/2005  Findings: There is a rounded than 3 cm mass within the junction of the right middle lobe and right upper lobe (image #1).  This is not completely imaged.  No pericardial fluid.  There is no focal hepatic lesion on this noncontrast exam.  The gallbladder, pancreas, spleen, and adrenal glands are normal.  There is a large calcification in the left renal hilum measuring 18 mm.  There is a 9 mm dependent calcification within the left renal pelvis.  There is mild dilatation of the left renal pelvis is increased compared to prior.  No evidence of obstructing calculi in the course of the left ureter which is mildly  dilated.  Right kidney demonstrates a low density lesion in the upper pole which likely represents simple cyst (image 32).  This measures 25 mm and cannot be fully characterized without IV contrast.  No evidence of right ureterolithiasis.  The stomach, small bowel, appendix, and cecum are normal.  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  No evidence obstruction of the bowel.  Abdominal aorta normal caliber.  No retroperitoneal periportal lymphadenopathy.  The prostate gland bladder are normal.  There is no pelvic lymphadenopathy.  Rectal pouch appears normal.  There is severe degenerative changes of the hips again noted.  No aggressive osseous lesions  IMPRESSION: . 1.  Left pelvicaliectasis and hydroureter without obstructing calculus is identified.  This could represent the sequela of a passed calculus. 2. Large  calculi within the left renal pelvis and the left kidney. 3.  Low density lesion in the right kidney likely represents a simple cyst but cannot be fully characterized. 4.  Left lower  quadrant colostomy without complication. 5.  There is a 3 cm mass within the right upper lobe centrally. This is not fully characterized on this exam.  Recommend CT thorax with contrast for further evaluation.  Differential includes benign or  malignant neoplasm or metastasis.   Original Report Authenticated By: Genevive Bi, M.D.    Dg Chest 2 View  01/05/2013  *RADIOLOGY REPORT*  Clinical Data: Cough, fever.  CHEST - 2 VIEW  Comparison: June 24, 2010.  Findings: Cardiomediastinal silhouette appears normal.  No acute pulmonary disease is noted.  Bony thorax is intact.  IMPRESSION: No acute cardiopulmonary abnormality seen.   Original Report Authenticated By: Lupita Raider.,  M.D.    Nm Myocar Multi W/spect W/wall Motion / Ef  01/10/2013  Clinical Data:  Chest pain.  Hypertension.  Technique:  Standard myocardial SPECT imaging performed after resting intravenous injection of Tc-73m tetrofosmin.  Subsequently, stress in the form of Lexiscan  was administered under the supervision of the Cardiology staff.  At peak stress, Tc-15m tetrofosmin was injected intravenously and standard myocardial SPECT imaging performed.  Quantitative gated imaging also performed to evaluate left ventricular wall motion and estimate left ventricular ejection fraction.  Radiopharmaceutical: Tc-32m tetrofosmin, 30 mCi at rest and 30 mCi at stress.  Comparison:  01/05/2013 radiographs  MYOCARDIAL IMAGING WITH SPECT (REST AND STRESS)  Findings:  Matched reduced activity noted in the anterior wall especially in the vicinity of the apex.  No inducible ischemia identified.  LEFT VENTRICULAR EJECTION FRACTION  Findings:  Left ventricular end-diastolic volume is 68 cc.  End- systolic volume is 21 cc.  Derived LV ejection fraction is 69%.  GATED LEFT VENTRICULAR WALL MOTION STUDY  Findings:  Left ventricular wall motion and wall thickening appear normal.  IMPRESSION:  1.  Suspected anterior wall scar along the cardiac apex.  No inducible  ischemia noted.  Left ventricular ejection fraction calculated at 69%.   Original Report Authenticated By: Gaylyn Rong, M.D.     Microbiology: Recent Results (from the past 240 hour(s))  URINE CULTURE     Status: None   Collection Time    01/05/13  1:51 PM      Result Value Range Status   Specimen Description URINE, CLEAN CATCH   Final   Special Requests NONE   Final   Culture  Setup Time 01/05/2013 22:00   Final   Colony Count >=100,000 COLONIES/ML   Final   Culture ESCHERICHIA COLI   Final   Report Status 01/07/2013  FINAL   Final   Organism ID, Bacteria ESCHERICHIA COLI   Final  CULTURE, BLOOD (ROUTINE X 2)     Status: None   Collection Time    01/06/13 10:14 AM      Result Value Range Status   Specimen Description BLOOD RIGHT HAND   Final   Special Requests BOTTLES DRAWN AEROBIC ONLY 5CC   Final   Culture  Setup Time 01/06/2013 14:03   Final   Culture     Final   Value:        BLOOD CULTURE RECEIVED NO GROWTH TO DATE CULTURE WILL BE HELD FOR 5 DAYS BEFORE ISSUING A FINAL NEGATIVE REPORT   Report Status PENDING   Incomplete  CULTURE, BLOOD (ROUTINE X 2)     Status: None   Collection Time    01/06/13 10:18 AM      Result Value Range Status   Specimen Description BLOOD RIGHT HAND   Final   Special Requests BOTTLES DRAWN AEROBIC AND ANAEROBIC [facility]   Final   Culture  Setup Time 01/06/2013 14:03   Final   Culture     Final   Value:        BLOOD CULTURE RECEIVED NO GROWTH TO DATE CULTURE WILL BE HELD FOR 5 DAYS BEFORE ISSUING A FINAL NEGATIVE REPORT   Report Status PENDING   Incomplete  MRSA PCR SCREENING     Status: Abnormal   Collection Time    01/06/13  2:14 PM      Result Value Range Status   MRSA by PCR POSITIVE (*) NEGATIVE Final   Comment:            The GeneXpert MRSA Assay (FDA     approved for NASAL specimens     only), is one component of a     comprehensive MRSA colonization     surveillance program. It is not     intended to diagnose MRSA     infection  nor to guide or     monitor treatment for     MRSA infections.     RESULT CALLED TO, READ BACK BY AND VERIFIED WITH:     AKatrinka Blazing RN 16:15 01/06/13 ([email or web address])     Labs: Basic Metabolic Panel:  Recent Labs Lab 01/06/13 0350 01/07/13 0225 01/08/13 0510 01/10/13 0715 01/11/13 0700  NA 139 145 144 144 142  K 4.2 4.2 3.8 3.5 3.8  CL 109 109 113* 111 112  CO2 20 20 22 22 21   GLUCOSE 90 58* 90 78 91  BUN 19 16 13 15 16   CREATININE 1.16 1.13 1.01 0.98 0.96  CALCIUM 8.6 8.9 8.8 9.2 9.0   CBC:  Recent Labs Lab 01/05/13 1505 01/06/13 0350 01/08/13 0510 01/10/13 0715  WBC 8.5 23.7* 7.9 6.4  NEUTROABS 8.1*  --   --   --   HGB 15.0 13.1 11.6* 12.6*  HCT 44.2 39.0 33.7* 37.0*  MCV 90.4 92.0 89.4 88.3  PLT 125* 109* 93* 129*   Cardiac Enzymes:  Recent Labs Lab 01/06/13 0610 01/06/13 1158 01/07/13 0225 01/07/13 1102  CKTOTAL  --  400* 258* 207  CKMB  --  5.2* 3.7 2.8  TROPONINI 0.35* <0.30 <0.30  --     SignedConley Canal [phone]  [facility] 01/11/2013, 11:41 AM

## 2013-01-11 NOTE — Discharge Summary (Signed)
Addendum  Patient seen and examined, chart and data base reviewed.  I agree with the above assessment and discharge plan.  For full details please see Mrs. Algis Downs PA note.  Acute pyelonephritis/UTI with sepsis discharged home on Ceftin to complete 14 days of antibiotics area  Elevated cardiac enzymes, stress test being negative, followup with Dr. Sharyn Lull as outpatient.   Clint Lipps, MD [facility] Pager: [phone] 01/11/2013, 7:29 PM

## 2013-01-11 NOTE — Progress Notes (Signed)
Subjective:  Patient denies any complaints. Denies any chest pain shortness of breath or urinary complaints  Objective:  Vital Signs in the last 24 hours: Temp:  [97.3 F (36.3 C)-97.8 F (36.6 C)] 97.3 F (36.3 C) (03/11 0550) Pulse Rate:  [69-94] 69 (03/11 0550) Resp:  [18-20] 18 (03/11 0550) BP: (123-147)/(59-87) 136/87 mmHg (03/11 0550) SpO2:  [95 %] 95 % (03/11 0550)  Intake/Output from previous day: 03/10 0701 - 03/11 0700 In: 1772.9 [I.V.:1622.9; IV Piggyback:150] Out: 2050 [Urine:2050] Intake/Output from this shift:    Physical Exam: Neck: no adenopathy, no carotid bruit, no JVD and supple, symmetrical, trachea midline Lungs: clear to auscultation bilaterally Heart: regular rate and rhythm, S1, S2 normal, no murmur, click, rub or gallop Abdomen: soft, non-tender; bowel sounds normal; no masses,  no organomegaly Extremities: extremities normal, atraumatic, no cyanosis or edema  Lab Results:  Recent Labs  01/10/13 0715  WBC 6.4  HGB 12.6*  PLT 129*    Recent Labs  01/10/13 0715 01/11/13 0700  NA 144 142  K 3.5 3.8  CL 111 112  CO2 22 21  GLUCOSE 78 91  BUN 15 16  CREATININE 0.98 0.96   No results found for this basename: TROPONINI, CK, MB,  in the last 72 hours Hepatic Function Panel No results found for this basename: PROT, ALBUMIN, AST, ALT, ALKPHOS, BILITOT, BILIDIR, IBILI,  in the last 72 hours No results found for this basename: CHOL,  in the last 72 hours No results found for this basename: PROTIME,  in the last 72 hours  Imaging: Imaging results have been reviewed and Nm Myocar Multi W/spect W/wall Motion / Ef  01/10/2013  Clinical Data:  Chest pain.  Hypertension.  Technique:  Standard myocardial SPECT imaging performed after resting intravenous injection of Tc-81m tetrofosmin.  Subsequently, stress in the form of Lexiscan  was administered under the supervision of the Cardiology staff.  At peak stress, Tc-8m tetrofosmin was injected  intravenously and standard myocardial SPECT imaging performed.  Quantitative gated imaging also performed to evaluate left ventricular wall motion and estimate left ventricular ejection fraction.  Radiopharmaceutical: Tc-6m tetrofosmin, 30 mCi at rest and 30 mCi at stress.  Comparison:  01/05/2013 radiographs  MYOCARDIAL IMAGING WITH SPECT (REST AND STRESS)  Findings:  Matched reduced activity noted in the anterior wall especially in the vicinity of the apex.  No inducible ischemia identified.  LEFT VENTRICULAR EJECTION FRACTION  Findings:  Left ventricular end-diastolic volume is 68 cc.  End- systolic volume is 21 cc.  Derived LV ejection fraction is 69%.  GATED LEFT VENTRICULAR WALL MOTION STUDY  Findings:  Left ventricular wall motion and wall thickening appear normal.  IMPRESSION:  1.  Suspected anterior wall scar along the cardiac apex.  No inducible ischemia noted.  Left ventricular ejection fraction calculated at 69%.   Original Report Authenticated By: Gaylyn Rong, M.D.     Cardiac Studies:  Assessment/Plan:  Probable very small non-Q-wave myocardial infarction precipitated by demand ischemia/hypotension negative nuclear stress test  Hypertension  Hypercholesteremia  Morbid obesity  Remote tobacco abuse  Possible pyelonephritis /nephrolithiasis  Marked leukocytosis  Thrombocytopenia probably secondary to sepsis  Left upper lobe mass questionable etiology  Morbid obesity  History of spinal bifid Plan Okay 2 discharge from cardiac point of view I will sign off please call if needed  LOS: 6 days    Patrick Orozco N 01/11/2013, 9:16 AM

## 2013-01-12 LAB — CULTURE, BLOOD (ROUTINE X 2)

## 2013-03-30 ENCOUNTER — Ambulatory Visit (INDEPENDENT_AMBULATORY_CARE_PROVIDER_SITE_OTHER): Payer: Medicare Other | Admitting: Emergency Medicine

## 2013-03-30 ENCOUNTER — Encounter: Payer: Self-pay | Admitting: Emergency Medicine

## 2013-03-30 VITALS — BP 140/90 | HR 80 | Temp 98.4°F | Ht 64.0 in | Wt 287.0 lb

## 2013-03-30 DIAGNOSIS — R222 Localized swelling, mass and lump, trunk: Secondary | ICD-10-CM

## 2013-03-30 DIAGNOSIS — R918 Other nonspecific abnormal finding of lung field: Secondary | ICD-10-CM

## 2013-03-30 NOTE — Patient Instructions (Addendum)
We will perform a CT scan of your chest  You will likely need a procedure called a bronchoscopy to allow [place] to biopsy your right lung mass. We will call you after the CT scan to arrange for this.

## 2013-03-30 NOTE — Progress Notes (Signed)
Subjective:    Patient ID: Patrick Orozco, male    DOB: 07/21/1956, 56 y.o.   MRN: [id number]  HPI 56 yo former smoker (15-20 pk-yrs), hx HTN, was seen by Dr Manny, [facility] for neurogenic bladder, nephrolithiasis, UTI. He underwent CT scan of the abdomen 01/05/13 that showed incomplete imaging of a RLL mass. He is referred to evaluate further.   He does have cough, bothers him every few days. Productive of yellow phlegm. Rare CP in the mid-chest. He hears some noise when breathing, can happen at random.    Review of Systems  Constitutional: Positive for diaphoresis. Negative for fever and unexpected weight change.  HENT: Negative for ear pain, nosebleeds, congestion, sore throat, rhinorrhea, sneezing, trouble swallowing, dental problem, postnasal drip and sinus pressure.   Eyes: Negative for redness and itching.  Respiratory: Positive for chest tightness, shortness of breath and wheezing. Negative for cough.   Cardiovascular: Positive for chest pain. Negative for palpitations and leg swelling.  Gastrointestinal: Positive for abdominal pain. Negative for nausea and vomiting.  Genitourinary: Positive for dysuria and difficulty urinating.  Musculoskeletal: Negative for joint swelling.  Skin: Negative for rash.  Neurological: Negative for headaches.  Hematological: Does not bruise/bleed easily.  Psychiatric/Behavioral: Negative for dysphoric mood. The patient is not nervous/anxious.    Past Medical History  Diagnosis Date  . Spina bifida   . Hypertension   . Hyperlipidemia   . Kidney stones   . UTI (lower urinary tract infection)      Family History  Problem Relation Age of Onset  . Cancer Mother     ?lung  . Cancer Maternal Grandfather     lung     History   Social History  . Marital Status: Married    Spouse Name: N/A    Number of Children: 0  . Years of Education: N/A   Occupational History  . disability    Social History Main Topics  . Smoking status:  Former Smoker -- 0.25 packs/day for 20 years    Types: Cigarettes    Quit date: 03/03/1993  . Smokeless tobacco: Never Used  . Alcohol Use: No  . Drug Use: No  . Sexually Active: Not on file   Other Topics Concern  . Not on file   Social History Narrative  . No narrative on file     Allergies  Allergen Reactions  . Ivp Dye (Iodinated Diagnostic Agents) Itching     Outpatient Prescriptions Prior to Visit  Medication Sig Dispense Refill  . aspirin 81 MG chewable tablet Chew 1 tablet (81 mg total) by mouth daily.      . metoprolol (TOPROL-XL) 50 MG 24 hr tablet Take 50 mg by mouth daily.        . simvastatin (ZOCOR) 40 MG tablet Take 40 mg by mouth at bedtime.        . cefUROXime (CEFTIN) 500 MG tablet Take 1 tablet (500 mg total) by mouth 2 (two) times daily.  10 tablet  0  . LORazepam (ATIVAN) 0.5 MG tablet Take 1 tablet (0.5 mg total) by mouth at bedtime.  30 tablet  0   No facility-administered medications prior to visit.        Objective:   Physical Exam Filed Vitals:   03/30/13 1457  BP: 140/90  Pulse: 80  Temp: 98.4 F (36.9 C)  TempSrc: Oral  Height: 5' 4" (1.626 m)  Weight: 287 lb (130.182 kg)  SpO2: 98%   Gen: Pleasant, obese   man, in no distress,  normal affect  ENT: No lesions,  mouth clear,  oropharynx clear, no postnasal drip  Neck: No JVD, no TMG, no carotid bruits  Lungs: No use of accessory muscles,  clear without rales or rhonchi  Cardiovascular: RRR, heart sounds normal, no murmur or gallops, no peripheral edema  Musculoskeletal: No deformities, no cyanosis or clubbing  Neuro: alert, in wheelchair, able to move UE's  Skin: Warm, no lesions or rashes    ECG in 01/2013 >> no Q waves or ST changes, flattened T waves laterally  Spirometry today > poor technique (low exp time) > restriction     Assessment & Plan:  Lung mass- RUL 3 cm - need to perform CT chest to fully characterize. Best approach is likely ENB so will get a SuperD Ct  scan  - spirometry today to assess resp fxn in event he needs general anesthesia - will call him to arrange a procedure if indicated    

## 2013-03-30 NOTE — Assessment & Plan Note (Signed)
-   need to perform CT chest to fully characterize. Best approach is likely ENB so will get a SuperD Ct scan  - spirometry today to assess resp fxn in event he needs general anesthesia - will call him to arrange a procedure if indicated

## 2013-04-05 ENCOUNTER — Telehealth: Payer: Self-pay | Admitting: Emergency Medicine

## 2013-04-05 NOTE — Telephone Encounter (Signed)
I spoke Rose. Calling to confirm CT is w/o contrast. RB did order CT w/o contrast and pt is allergic to IVP dye. I advised her this is correct. RB was next to me when I advised rose of this and confirmed w/o as well. Nothing further was needed

## 2013-04-06 ENCOUNTER — Ambulatory Visit (INDEPENDENT_AMBULATORY_CARE_PROVIDER_SITE_OTHER)
Admission: RE | Admit: 2013-04-06 | Discharge: 2013-04-06 | Disposition: A | Discharge status: Home / Self Care | Payer: Medicare Other | Source: Ambulatory Visit | Attending: Emergency Medicine | Admitting: Emergency Medicine

## 2013-04-06 DIAGNOSIS — R222 Localized swelling, mass and lump, trunk: Secondary | ICD-10-CM

## 2013-04-06 DIAGNOSIS — R918 Other nonspecific abnormal finding of lung field: Secondary | ICD-10-CM

## 2013-04-11 ENCOUNTER — Telehealth: Payer: Self-pay | Admitting: Emergency Medicine

## 2013-04-11 DIAGNOSIS — R590 Localized enlarged lymph nodes: Secondary | ICD-10-CM

## 2013-04-11 NOTE — Telephone Encounter (Signed)
Need to discuss CT results, schedule for EBUS to eval R LAD

## 2013-04-12 NOTE — Telephone Encounter (Signed)
Discussed CT scan with pt. Will arrange for EBUS for needle bx. Order sent

## 2013-04-13 ENCOUNTER — Encounter (HOSPITAL_COMMUNITY): Payer: Self-pay | Admitting: *Deleted

## 2013-04-14 ENCOUNTER — Telehealth: Payer: Self-pay | Admitting: Emergency Medicine

## 2013-04-14 NOTE — Telephone Encounter (Signed)
I spoke with pt. He stated after his procedure he wants to be admitted to the hospital for the 24 hr stay. He has no one to take care of him or go with him since his spouse passed away. Please advise Dr. Delton Coombes thanks

## 2013-04-14 NOTE — Telephone Encounter (Signed)
i spoke with pt and is aware. Nothing further was needed 

## 2013-04-14 NOTE — Telephone Encounter (Signed)
Let him know that i will keep him in the hospital for observation overnight and then he can go home in the am

## 2013-04-15 ENCOUNTER — Encounter (HOSPITAL_COMMUNITY): Payer: Self-pay | Admitting: Pharmacy Technician

## 2013-04-25 ENCOUNTER — Ambulatory Visit (HOSPITAL_COMMUNITY): Payer: Medicare Other | Admitting: Anesthesiology

## 2013-04-25 ENCOUNTER — Encounter (HOSPITAL_COMMUNITY): Payer: Self-pay | Admitting: Anesthesiology

## 2013-04-25 ENCOUNTER — Ambulatory Visit (HOSPITAL_COMMUNITY)
Admission: RE | Admit: 2013-04-25 | Discharge: 2013-04-25 | Disposition: A | Discharge status: Home / Self Care | Payer: Medicare Other | Source: Ambulatory Visit | Attending: Emergency Medicine | Admitting: Emergency Medicine

## 2013-04-25 ENCOUNTER — Observation Stay (HOSPITAL_COMMUNITY)
Admission: RE | Admit: 2013-04-25 | Discharge: 2013-04-26 | Disposition: A | Discharge status: Home / Self Care | Payer: Medicare Other | Source: Ambulatory Visit | Attending: Emergency Medicine | Admitting: Emergency Medicine

## 2013-04-25 ENCOUNTER — Encounter (HOSPITAL_COMMUNITY): Payer: Self-pay | Admitting: *Deleted

## 2013-04-25 ENCOUNTER — Encounter (HOSPITAL_COMMUNITY)
Admission: RE | Disposition: A | Discharge status: Home / Self Care | Payer: Self-pay | Source: Ambulatory Visit | Attending: Emergency Medicine

## 2013-04-25 DIAGNOSIS — L89609 Pressure ulcer of unspecified heel, unspecified stage: Secondary | ICD-10-CM | POA: Insufficient documentation

## 2013-04-25 DIAGNOSIS — I2510 Atherosclerotic heart disease of native coronary artery without angina pectoris: Secondary | ICD-10-CM | POA: Insufficient documentation

## 2013-04-25 DIAGNOSIS — E785 Hyperlipidemia, unspecified: Secondary | ICD-10-CM | POA: Insufficient documentation

## 2013-04-25 DIAGNOSIS — L8992 Pressure ulcer of unspecified site, stage 2: Secondary | ICD-10-CM | POA: Insufficient documentation

## 2013-04-25 DIAGNOSIS — I10 Essential (primary) hypertension: Secondary | ICD-10-CM | POA: Insufficient documentation

## 2013-04-25 DIAGNOSIS — N319 Neuromuscular dysfunction of bladder, unspecified: Secondary | ICD-10-CM | POA: Insufficient documentation

## 2013-04-25 DIAGNOSIS — J8410 Pulmonary fibrosis, unspecified: Secondary | ICD-10-CM | POA: Insufficient documentation

## 2013-04-25 DIAGNOSIS — Q059 Spina bifida, unspecified: Secondary | ICD-10-CM | POA: Insufficient documentation

## 2013-04-25 DIAGNOSIS — R05 Cough: Secondary | ICD-10-CM | POA: Insufficient documentation

## 2013-04-25 DIAGNOSIS — Z87440 Personal history of urinary (tract) infections: Secondary | ICD-10-CM | POA: Insufficient documentation

## 2013-04-25 DIAGNOSIS — Z87891 Personal history of nicotine dependence: Secondary | ICD-10-CM | POA: Insufficient documentation

## 2013-04-25 DIAGNOSIS — F329 Major depressive disorder, single episode, unspecified: Secondary | ICD-10-CM | POA: Insufficient documentation

## 2013-04-25 DIAGNOSIS — R079 Chest pain, unspecified: Secondary | ICD-10-CM | POA: Insufficient documentation

## 2013-04-25 DIAGNOSIS — Z79899 Other long term (current) drug therapy: Secondary | ICD-10-CM | POA: Insufficient documentation

## 2013-04-25 DIAGNOSIS — R918 Other nonspecific abnormal finding of lung field: Secondary | ICD-10-CM

## 2013-04-25 DIAGNOSIS — R599 Enlarged lymph nodes, unspecified: Secondary | ICD-10-CM | POA: Insufficient documentation

## 2013-04-25 DIAGNOSIS — R222 Localized swelling, mass and lump, trunk: Principal | ICD-10-CM

## 2013-04-25 DIAGNOSIS — K8020 Calculus of gallbladder without cholecystitis without obstruction: Secondary | ICD-10-CM | POA: Insufficient documentation

## 2013-04-25 DIAGNOSIS — F3289 Other specified depressive episodes: Secondary | ICD-10-CM | POA: Insufficient documentation

## 2013-04-25 DIAGNOSIS — R059 Cough, unspecified: Secondary | ICD-10-CM | POA: Insufficient documentation

## 2013-04-25 DIAGNOSIS — Z87442 Personal history of urinary calculi: Secondary | ICD-10-CM | POA: Insufficient documentation

## 2013-04-25 DIAGNOSIS — R61 Generalized hyperhidrosis: Secondary | ICD-10-CM | POA: Insufficient documentation

## 2013-04-25 HISTORY — DX: Shortness of breath: R06.02

## 2013-04-25 HISTORY — DX: Gastro-esophageal reflux disease without esophagitis: K21.9

## 2013-04-25 HISTORY — DX: Depression, unspecified: F32.A

## 2013-04-25 HISTORY — PX: ENDOBRONCHIAL ULTRASOUND: SHX5096

## 2013-04-25 HISTORY — DX: Major depressive disorder, single episode, unspecified: F32.9

## 2013-04-25 LAB — COMPREHENSIVE METABOLIC PANEL
ALT: 28 U/L (ref 0–53)
AST: 23 U/L (ref 0–37)
Albumin: 3.2 g/dL — ABNORMAL LOW (ref 3.5–5.2)
Alkaline Phosphatase: 81 U/L (ref 39–117)
Calcium: 9.4 mg/dL (ref 8.4–10.5)
GFR calc Af Amer: 83 mL/min — ABNORMAL LOW (ref >90)
Glucose, Bld: 106 mg/dL — ABNORMAL HIGH (ref 70–99)
Potassium: 3.9 mEq/L (ref 3.5–5.1)
Sodium: 141 mEq/L (ref 135–145)
Total Protein: 6.8 g/dL (ref 6.0–8.3)

## 2013-04-25 SURGERY — ENDOBRONCHIAL ULTRASOUND (EBUS)
Anesthesia: General | Laterality: Bilateral | Wound class: Clean Contaminated

## 2013-04-25 MED ORDER — LACTATED RINGERS IV SOLN
INTRAVENOUS | Status: DC
  Administered 2013-04-25: 09:00:00 via INTRAVENOUS

## 2013-04-25 MED ORDER — LACTATED RINGERS IV SOLN
INTRAVENOUS | Status: DC | PRN
  Administered 2013-04-25: 09:00:00 via INTRAVENOUS

## 2013-04-25 MED ORDER — METOPROLOL SUCCINATE ER 50 MG PO TB24
50.0000 mg | ORAL_TABLET | Freq: Every morning | ORAL | Status: DC
  Administered 2013-04-26: 50 mg via ORAL
  Filled 2013-04-25 (×2): qty 1

## 2013-04-25 MED ORDER — LIDOCAINE HCL 4 % MT SOLN
OROMUCOSAL | Status: DC | PRN
  Administered 2013-04-25: 4 mL via TOPICAL

## 2013-04-25 MED ORDER — MIDAZOLAM HCL 5 MG/5ML IJ SOLN
INTRAMUSCULAR | Status: DC | PRN
  Administered 2013-04-25: 2 mg via INTRAVENOUS

## 2013-04-25 MED ORDER — LIDOCAINE HCL (PF) 2 % IJ SOLN
INTRAMUSCULAR | Status: DC | PRN
  Administered 2013-04-25: 20 mg

## 2013-04-25 MED ORDER — SIMVASTATIN 40 MG PO TABS
40.0000 mg | ORAL_TABLET | Freq: Every day | ORAL | Status: DC
  Administered 2013-04-25: 40 mg via ORAL
  Filled 2013-04-25 (×3): qty 1

## 2013-04-25 MED ORDER — PROPOFOL 10 MG/ML IV BOLUS
INTRAVENOUS | Status: DC | PRN
  Administered 2013-04-25: 200 mg via INTRAVENOUS

## 2013-04-25 MED ORDER — DEXAMETHASONE SODIUM PHOSPHATE 10 MG/ML IJ SOLN
INTRAMUSCULAR | Status: DC | PRN
  Administered 2013-04-25: 10 mg via INTRAVENOUS

## 2013-04-25 MED ORDER — ACETAMINOPHEN 650 MG RE SUPP
650.0000 mg | Freq: Four times a day (QID) | RECTAL | Status: DC | PRN
  Filled 2013-04-25: qty 1

## 2013-04-25 MED ORDER — PROMETHAZINE HCL 25 MG/ML IJ SOLN: 6.2500 mg | INTRAMUSCULAR | Status: DC | PRN

## 2013-04-25 MED ORDER — ONDANSETRON HCL 4 MG/2ML IJ SOLN
INTRAMUSCULAR | Status: DC | PRN
  Administered 2013-04-25: 4 mg via INTRAVENOUS

## 2013-04-25 MED ORDER — FENTANYL CITRATE 0.05 MG/ML IJ SOLN
INTRAMUSCULAR | Status: DC | PRN
  Administered 2013-04-25: 100 ug via INTRAVENOUS
  Administered 2013-04-25 (×3): 50 ug via INTRAVENOUS

## 2013-04-25 MED ORDER — ACETAMINOPHEN 325 MG PO TABS
650.0000 mg | ORAL_TABLET | Freq: Four times a day (QID) | ORAL | Status: DC | PRN
  Filled 2013-04-25: qty 2

## 2013-04-25 MED ORDER — SUCCINYLCHOLINE CHLORIDE 20 MG/ML IJ SOLN
INTRAMUSCULAR | Status: DC | PRN
  Administered 2013-04-25: 120 mg via INTRAVENOUS

## 2013-04-25 MED ORDER — EPHEDRINE SULFATE 50 MG/ML IJ SOLN
INTRAMUSCULAR | Status: DC | PRN
  Administered 2013-04-25: 5 mg via INTRAVENOUS
  Administered 2013-04-25: 10 mg via INTRAVENOUS

## 2013-04-25 MED ORDER — PHENYLEPHRINE HCL 10 MG/ML IJ SOLN
INTRAMUSCULAR | Status: DC | PRN
  Administered 2013-04-25 (×5): 80 ug via INTRAVENOUS

## 2013-04-25 MED ORDER — OXYCODONE HCL 5 MG PO TABS: 5.0000 mg | ORAL_TABLET | ORAL | Status: DC | PRN

## 2013-04-25 NOTE — Anesthesia Postprocedure Evaluation (Signed)
  Anesthesia Post-op Note  Patient: Patrick Orozco  Procedure(s) Performed: Procedure(s) (LRB): ENDOBRONCHIAL ULTRASOUND (Bilateral)  Patient Location: PACU  Anesthesia Type: general  Level of Consciousness: awake and alert   Airway and Oxygen Therapy: Patient Spontanous Breathing  Post-op Pain: mild  Post-op Assessment: Post-op Vital signs reviewed, Patient's Cardiovascular Status Stable, Respiratory Function Stable, Patent Airway and No signs of Nausea or vomiting  Last Vitals:  Filed Vitals:   04/25/13 1124  BP: 115/63  Pulse: 90  Temp: 36.4 C  Resp: 12    Post-op Vital Signs: stable   Complications: No apparent anesthesia complications

## 2013-04-25 NOTE — Transfer of Care (Signed)
Immediate Anesthesia Transfer of Care Note  Patient: Patrick Orozco  Procedure(s) Performed: Procedure(s) (LRB): ENDOBRONCHIAL ULTRASOUND (Bilateral)  Patient Location: PACU  Anesthesia Type: General  Level of Consciousness: sedated, patient cooperative and responds to stimulaton  Airway & Oxygen Therapy: Patient Spontanous Breathing and Patient connected to face mask oxgen  Post-op Assessment: Report given to PACU RN and Post -op Vital signs reviewed and stable  Post vital signs: Reviewed and stable  Complications: No apparent anesthesia complications

## 2013-04-25 NOTE — Anesthesia Preprocedure Evaluation (Addendum)
Anesthesia Evaluation  Patient identified by MRN, date of birth, ID band Patient awake    Reviewed: Allergy & Precautions, H&P , NPO status , Patient's Chart, lab work & pertinent test results  Airway Mallampati: III TM Distance: <3 FB Neck ROM: Full    Dental no notable dental hx.    Pulmonary neg pulmonary ROS,  breath sounds clear to auscultation  + decreased breath sounds      Cardiovascular hypertension, Pt. on medications Rhythm:Regular Rate:Normal  Off beta blocker x 10 days b/c prescription ran out   Neuro/Psych Spina bifida  paraplegic negative psych ROS   GI/Hepatic Neg liver ROS,   Endo/Other  negative endocrine ROSMorbid obesity  Renal/GU negative Renal ROS  negative genitourinary   Musculoskeletal negative musculoskeletal ROS (+)   Abdominal   Peds negative pediatric ROS (+)  Hematology negative hematology ROS (+)   Anesthesia Other Findings   Reproductive/Obstetrics negative OB ROS                          Anesthesia Physical Anesthesia Plan  ASA: III  Anesthesia Plan: General   Post-op Pain Management:    Induction: Intravenous  Airway Management Planned: Oral ETT  Additional Equipment:   Intra-op Plan:   Post-operative Plan: Extubation in OR  Informed Consent: I have reviewed the patients History and Physical, chart, labs and discussed the procedure including the risks, benefits and alternatives for the proposed anesthesia with the patient or authorized representative who has indicated his/her understanding and acceptance.   Dental advisory given  Plan Discussed with: Surgeon and CRNA  Anesthesia Plan Comments:         Anesthesia Quick Evaluation

## 2013-04-25 NOTE — Op Note (Signed)
Video Bronchoscopy with Endobronchial Ultrasound Procedure Note  Date of Operation: 04/25/2013  Pre-op Diagnosis: Hilar and mediastinal lymphadenopathy  Post-op Diagnosis: Same  Surgeon: Levy Pupa  Assistants: None  Anesthesia: General endotracheal anesthesia  Operation: Flexible video fiberoptic bronchoscopy with endobronchial ultrasound and biopsies.  Estimated Blood Loss: 15cc  Complications: None apparent  Indications and History: Patrick Orozco is a 57 y.o. male with a R hilar nodule vs lymph node found spuriously on CT scan of the abdomen. Decision was made to perform EBUS and biopsies to achieve tissue diagnosis.  The risks, benefits, complications, treatment options and expected outcomes were discussed with the patient.  The possibilities of pneumothorax, pneumonia, reaction to medication, pulmonary aspiration, perforation of a viscus, bleeding, failure to diagnose a condition and creating a complication requiring transfusion or operation were discussed with the patient who freely signed the consent.    Description of Procedure: The patient was examined in the preoperative area and history and data from the preprocedure consultation were reviewed. It was deemed appropriate to proceed.  The patient was taken to [facility] long Endoscopy, identified as [name] and the procedure verified as Flexible Video Fiberoptic Bronchoscopy.  A Time Out was held and the above information confirmed. General anesthesia was initiated and the patient  was orally intubated. The video fiberoptic bronchoscope was introduced via the endotracheal tube and a general inspection was performed which showed normal airways throughout. The standard scope was then withdrawn and the endobronchial ultrasound was used to identify and characterize the peritracheal, hilar and bronchial lymph nodes. Inspection showed enlargement of 4R and 10R nodes. All other identifiable nodes were normal in size. Using  real-time ultrasound guidance Wang needle biopsies were take from Station 4R and 10R  nodes and were sent for cytology. The patient tolerated the procedure well without apparent complications. There was no significant blood loss. The bronchoscope was withdrawn. Anesthesia was reversed and the patient was taken to the PACU for recovery.   Samples: 1. Wang needle biopsies from 4R node 2. Wang needle biopsies from 10R node  Plans:  The patient will be admitted overnight for observation when recovered from anesthesia. We will review the cytology results with the patient when they become available. Outpatient followup will be with Dr Delton Coombes.    Levy Pupa, MD, PhD 04/25/2013, 11:15 AM [facility] [phone] or if no answer [phone]

## 2013-04-25 NOTE — H&P (View-Only) (Signed)
Subjective:    Patient ID: Patrick Orozco, male    DOB: 11-Jul-1956, 57 y.o.   MRN: [id number]  HPI 57 yo former smoker (15-20 pk-yrs), hx HTN, was seen by Dr Berneice Heinrich, [facility] for neurogenic bladder, nephrolithiasis, UTI. He underwent CT scan of the abdomen 01/05/13 that showed incomplete imaging of a RLL mass. He is referred to evaluate further.   He does have cough, bothers him every few days. Productive of yellow phlegm. Rare CP in the mid-chest. He hears some noise when breathing, can happen at random.    Review of Systems  Constitutional: Positive for diaphoresis. Negative for fever and unexpected weight change.  HENT: Negative for ear pain, nosebleeds, congestion, sore throat, rhinorrhea, sneezing, trouble swallowing, dental problem, postnasal drip and sinus pressure.   Eyes: Negative for redness and itching.  Respiratory: Positive for chest tightness, shortness of breath and wheezing. Negative for cough.   Cardiovascular: Positive for chest pain. Negative for palpitations and leg swelling.  Gastrointestinal: Positive for abdominal pain. Negative for nausea and vomiting.  Genitourinary: Positive for dysuria and difficulty urinating.  Musculoskeletal: Negative for joint swelling.  Skin: Negative for rash.  Neurological: Negative for headaches.  Hematological: Does not bruise/bleed easily.  Psychiatric/Behavioral: Negative for dysphoric mood. The patient is not nervous/anxious.    Past Medical History  Diagnosis Date  . Spina bifida   . Hypertension   . Hyperlipidemia   . Kidney stones   . UTI (lower urinary tract infection)      Family History  Problem Relation Age of Onset  . Cancer Mother     ?lung  . Cancer Maternal Grandfather     lung     History   Social History  . Marital Status: Married    Spouse Name: N/A    Number of Children: 0  . Years of Education: N/A   Occupational History  . disability    Social History Main Topics  . Smoking status:  Former Smoker -- 0.25 packs/day for 20 years    Types: Cigarettes    Quit date: 03/03/1993  . Smokeless tobacco: Never Used  . Alcohol Use: No  . Drug Use: No  . Sexually Active: Not on file   Other Topics Concern  . Not on file   Social History Narrative  . No narrative on file     Allergies  Allergen Reactions  . Ivp Dye (Iodinated Diagnostic Agents) Itching     Outpatient Prescriptions Prior to Visit  Medication Sig Dispense Refill  . aspirin 81 MG chewable tablet Chew 1 tablet (81 mg total) by mouth daily.      . metoprolol (TOPROL-XL) 50 MG 24 hr tablet Take 50 mg by mouth daily.        . simvastatin (ZOCOR) 40 MG tablet Take 40 mg by mouth at bedtime.        . cefUROXime (CEFTIN) 500 MG tablet Take 1 tablet (500 mg total) by mouth 2 (two) times daily.  10 tablet  0  . LORazepam (ATIVAN) 0.5 MG tablet Take 1 tablet (0.5 mg total) by mouth at bedtime.  30 tablet  0   No facility-administered medications prior to visit.        Objective:   Physical Exam Filed Vitals:   03/30/13 1457  BP: 140/90  Pulse: 80  Temp: 98.4 F (36.9 C)  TempSrc: Oral  Height: 5\' 4"  (1.626 m)  Weight: 287 lb (130.182 kg)  SpO2: 98%   Gen: Pleasant, obese  man, in no distress,  normal affect  ENT: No lesions,  mouth clear,  oropharynx clear, no postnasal drip  Neck: No JVD, no TMG, no carotid bruits  Lungs: No use of accessory muscles,  clear without rales or rhonchi  Cardiovascular: RRR, heart sounds normal, no murmur or gallops, no peripheral edema  Musculoskeletal: No deformities, no cyanosis or clubbing  Neuro: alert, in wheelchair, able to move UE's  Skin: Warm, no lesions or rashes    ECG in 01/2013 >> no Q waves or ST changes, flattened T waves laterally  Spirometry today > poor technique (low exp time) > restriction     Assessment & Plan:  Lung mass- RUL 3 cm - need to perform CT chest to fully characterize. Best approach is likely ENB so will get a SuperD Ct  scan  - spirometry today to assess resp fxn in event he needs general anesthesia - will call him to arrange a procedure if indicated

## 2013-04-25 NOTE — Interval H&P Note (Signed)
PCCM Interval hx  Mr Tomasik is s/p CT chest as below, shows mediastinal and hilar LAD, etiology unclear. Decision made to pursue needle biopsies by EBUS. No new issues since office visit.   04/06/13 --  Comparison: 01/05/2013.  Findings: Mediastinal lymph nodes measure up to 11 mm in the low  right paratracheal station. Hilar regions are difficult to  definitively evaluate without IV contrast but appear bulky,  suggesting adenopathy. For example, a 2.1 x 3.1 cm lesion is seen  in the right infrahilar region, along the medial aspect of the  right major fissure. No axillary adenopathy. Coronary artery  calcification. Heart size normal. No pericardial effusion.  Scattered pulmonary parenchymal scarring in the lower lobes. Lungs  are otherwise clear. No pleural fluid. No air trapping on  inspiratory and expiratory imaging. No subpleural reticulation,  traction bronchiectasis/bronchiolectasis, architectural distortion  or honeycombing to indicate interstitial lung disease. Airway is  unremarkable.  Incidental imaging of the upper abdomen shows stones layering in  the gallbladder. A low attenuation lesion in the upper pole right  kidney is only minimally visualized. Scarring in the upper pole  left kidney. No worrisome lytic or sclerotic lesions.  IMPRESSION:  1. Borderline mediastinal adenopathy with bihilar adenopathy,  right greater than left. Sarcoid or a lymphoproliferative disorder  could have this appearance. No pulmonary parenchymal mass.  2. No evidence of interstitial lung disease.  3. Coronary artery calcification.  4. Cholelithiasis  Meds: ASA, metoprolol, simvastatin  Filed Vitals:   04/13/13 1708  Height: 5\' 5"  (1.651 m)  Weight: 130.182 kg (287 lb)   Gen: Pleasant, obese man, in no distress,  normal affect, uses wheelchair  ENT: No lesions,  mouth clear,  oropharynx clear, no postnasal drip  Neck: No JVD, no TMG, no carotid bruits  Lungs: No use of accessory  muscles,  clear without rales or rhonchi  Cardiovascular: RRR, heart sounds normal, no murmur or gallops, no peripheral edema  Musculoskeletal: No deformities, no cyanosis or clubbing  Neuro: alert, in wheelchair, able to move UE's  Skin: Warm, no lesions or rashes  Plans:  Will proceed with EBUS today. All questions answered. Plan for biopsies and to keep patient overnight for observation, then d/c to home in the am 6/24.   Levy Pupa, MD, PhD 04/25/2013, 8:53 AM [facility] [phone] or if no answer [phone]

## 2013-04-26 ENCOUNTER — Encounter: Payer: Self-pay | Admitting: Pulmonary Disease

## 2013-04-26 ENCOUNTER — Encounter (HOSPITAL_COMMUNITY): Payer: Self-pay | Admitting: Emergency Medicine

## 2013-04-26 DIAGNOSIS — R222 Localized swelling, mass and lump, trunk: Secondary | ICD-10-CM

## 2013-04-26 LAB — MRSA PCR SCREENING: MRSA by PCR: NEGATIVE

## 2013-04-26 LAB — CBC
MCH: 30 pg (ref 26.0–34.0)
MCHC: 33.4 g/dL (ref 30.0–36.0)
Platelets: 166 10*3/uL (ref 150–400)

## 2013-04-26 NOTE — Discharge Summary (Signed)
Physician Discharge Summary  Patient ID: Patrick Orozco MRN: [id number] DOB/AGE: 58-14-1957 58 y.o.  Admit date: 04/25/2013 Discharge date: 04/26/2013    Discharge Diagnoses:  Right Hilar Nodule vs Lymph Node Mediastinal Adenopathy of unclear etiology Left Foot Decubitus Ulcer                                                                      DISCHARGE PLAN BY DIAGNOSIS     Right Hilar Nodule vs Lymph Node Mediastinal Adenopathy of unclear etiology   Discharge Plan: -follow up with Dr. Delton Coombes in office as scheduled -Dr. Delton Coombes will call you with the results of bipsy   Left Foot Decubitus Ulcer Left plantar - heel ulceration.  Stage 2.  Approx 1.5 in x 0.5 in with pink wound bed, small amt yellow drainage from wound  Discharge Plan: -arrange for home health RN to visit for wound care with dressing change.  Clean site with NS, pat dry and apply protective barrier foam dressing.  -encouraged patient to have his home aide to inspect his feet / legs weekly for wounds. -follow up with PCP for wound needs.                 DISCHARGE SUMMARY   Patrick Orozco is a 57 y.o. y/o male with a PMH of Spina Bifida, HTN, HLD, UTI's, Depression after death of spouse (1 year ago May 19, 2023) who was found to have a R hilar nodule vs lymph node found spuriously on CT scan of the abdomen. CT of Chest performed on 6/4 noted borderline mediastinal adenopathy with bihilar adenopathy, right greater than left.  The decision was made to perform EBUS and biopsies to achieve tissue diagnosis.  Patient underwent EBUS & biopsy on 6/23 and was admitted to [facility] for post biopsy for observation.  He clinically tolerated procedure well and was discharged on 6/24. On admit, he was found to have a left foot heel ulceration and will be sent home with home health RN for wound care.               SIGNIFICANT DIAGNOSTIC STUDIES 6/4 CT Chest - Borderline mediastinal adenopathy with bihilar adenopathy, right greater  than left. Sarcoid or a lymphoproliferative disorder could have this appearance. No pulmonary parenchymal mass. No evidence of interstitial lung disease.  Coronary artery calcification.    MICRO / PATHOLOGY DATA  6/23 4R Bx>>> 6/23 10R Bx>>>   Discharge Exam: Gen: Pleasant, obese man, in no distress, normal affect, uses wheelchair  ENT: No lesions, mouth clear, oropharynx clear, no postnasal drip  Neck: No JVD, no TMG, no carotid bruits  Lungs: No use of accessory muscles, clear without rales or rhonchi  Cardiovascular: RRR, heart sounds normal, no murmur or gallops, no peripheral edema  Musculoskeletal: No deformities, no cyanosis or clubbing  Neuro: alert, in bed, able to move UE's  Skin: Warm, no lesions or rashes   Filed Vitals:   04/25/13 1230 04/25/13 2012 04/26/13 0502 04/26/13 1048  BP: 133/81 144/76 142/58 134/80  Pulse: 80 91 68 66  Temp: 97.5 F (36.4 C) 98.6 F (37 C) 98.1 F (36.7 C)   TempSrc: Oral Oral Oral   Resp: 16 16 16    Height:  Weight:      SpO2: 98% 97% 99%      Discharge Labs  BMET  Recent Labs Lab 04/25/13 1310  NA 141  K 3.9  CL 109  CO2 24  GLUCOSE 106*  BUN 15  CREATININE 1.11  CALCIUM 9.4   CBC  Recent Labs Lab 04/26/13 0345  HGB 13.3  HCT 39.8  WBC 8.7  PLT 166    Discharge Orders   Future Appointments Provider Department Dept Phone   05/26/2013 11:15 AM Leslye Peer, MD [facility] [phone]   Future Orders Complete By Expires     Call MD for:  difficulty breathing, headache or visual disturbances  As directed     Call MD for:  persistant dizziness or light-headedness  As directed     Call MD for:  temperature >100.4  As directed     Diet - low sodium heart healthy  As directed     Increase activity slowly  As directed        Follow-up Information   Follow up with Leslye Peer., MD On 05/26/2013. (Appt at 11:15)    Contact information:   [address] [place]  [address] [phone]         Medication List    TAKE these medications       aspirin EC 81 MG tablet  Take 81 mg by mouth daily.     metoprolol succinate 50 MG 24 hr tablet  Commonly known as:  TOPROL-XL  Take 50 mg by mouth every morning.     simvastatin 40 MG tablet  Commonly known as:  ZOCOR  Take 40 mg by mouth at bedtime.         Disposition: Home / Self Care.  No new home needs identified during admit.   Discharged Condition: Patrick Orozco has met maximum benefit of inpatient care and is medically stable and cleared for discharge.  Patient is pending follow up as above.      Time spent on disposition:  Greater than 35 minutes.   Signed: Canary Brim, NP-C [facility] Pgr: [phone] Office: [phone]    Levy Pupa, MD, PhD 04/26/2013, 2:51 PM [facility] [phone] or if no answer [phone]

## 2013-04-26 NOTE — Care Management Note (Signed)
Cm spoke with patient concerning discharge planning. Md order for [facility]. Per pt choice Genev[facility] Norlander to provide [facility] services. [facility] Norlander rep Venia Minks notified of referral. Pt uses electric wheelchair, [employer] to provide transportation.     Roxy Manns Berk Pilot,RN,BSN [phone]

## 2013-04-26 NOTE — Progress Notes (Signed)
D/c instructions given, patient verbalized understanding,stable, dsg to L foot changed.Hulda Marin RN

## 2013-04-26 NOTE — Progress Notes (Signed)
Patient d/c home,stable,has his motorized vehicle,alert and orientedx4.No c/o pain.Hulda Marin RN

## 2013-04-27 ENCOUNTER — Telehealth: Payer: Self-pay | Admitting: Emergency Medicine

## 2013-04-27 NOTE — Telephone Encounter (Signed)
Reviewed cytology with patient. Shows non-caseating granulomas. AFB and fungal stains negative. Suspect sarcoidosis in this setting. Will plan to discuss further with him at [facility]

## 2013-04-29 ENCOUNTER — Telehealth: Payer: Self-pay | Admitting: Emergency Medicine

## 2013-04-29 NOTE — Telephone Encounter (Signed)
Spoke with Alona Bene and gave VO for nurse visits  She verbalized understanding and states nothing further needed

## 2013-04-29 NOTE — Telephone Encounter (Signed)
Spoke with Alona Bene from [facility]-- She will be seeing patient 2 times this week, 3 times next week, and then 2 times a week thereafter for dressing changes on patients foot wound. She also states she will be using silver algenate on the wound She hope to teach the patient home health aid how to change the dressing as well so that the patient can average 5 dressing changes per week. Would like Dr.Byrum to approve/be aware of this. Please advise, thanks

## 2013-04-29 NOTE — Telephone Encounter (Signed)
This is ok with me  

## 2013-05-05 ENCOUNTER — Telehealth: Payer: Self-pay | Admitting: Emergency Medicine

## 2013-05-05 NOTE — Telephone Encounter (Signed)
Thank you :)

## 2013-05-05 NOTE — Telephone Encounter (Signed)
Noted and will fill forward to Dr. Delton Coombes as an Lorain Childes

## 2013-05-18 ENCOUNTER — Telehealth: Payer: Self-pay | Admitting: Emergency Medicine

## 2013-05-18 NOTE — Telephone Encounter (Signed)
I spoke with Patrick Orozco and she states when she visited the pt today and took his BP is was elevated at 150/98 but she states she did not have a large cuff and this may be the issue. She states the pt denies any HA, chest pain, dizziness, vision issues. I provided cindy with PCP information to contact them. Carron Curie, CMA

## 2013-05-24 DIAGNOSIS — G8220 Paraplegia, unspecified: Secondary | ICD-10-CM

## 2013-05-24 DIAGNOSIS — C3490 Malignant neoplasm of unspecified part of unspecified bronchus or lung: Secondary | ICD-10-CM

## 2013-05-24 DIAGNOSIS — S91309A Unspecified open wound, unspecified foot, initial encounter: Secondary | ICD-10-CM

## 2013-05-24 DIAGNOSIS — Q059 Spina bifida, unspecified: Secondary | ICD-10-CM

## 2013-05-24 DIAGNOSIS — I10 Essential (primary) hypertension: Secondary | ICD-10-CM

## 2013-05-26 ENCOUNTER — Inpatient Hospital Stay: Payer: Medicare Other | Admitting: Emergency Medicine

## 2013-05-26 ENCOUNTER — Ambulatory Visit (INDEPENDENT_AMBULATORY_CARE_PROVIDER_SITE_OTHER): Payer: Medicare Other | Admitting: Emergency Medicine

## 2013-05-26 ENCOUNTER — Encounter: Payer: Self-pay | Admitting: Emergency Medicine

## 2013-05-26 VITALS — BP 160/96 | HR 65 | Temp 97.9°F

## 2013-05-26 DIAGNOSIS — D869 Sarcoidosis, unspecified: Secondary | ICD-10-CM

## 2013-05-26 NOTE — Assessment & Plan Note (Signed)
Discussed bx results - will start SABA prn - CXR in 6 mo - eye exam annually

## 2013-05-26 NOTE — Progress Notes (Signed)
Subjective:    Patient ID: Patrick Orozco, male    DOB: April 19, 1956, 57 y.o.   MRN: [id number]  HPI 57 yo former smoker (15-20 pk-yrs), hx HTN, was seen by Dr Berneice Heinrich, [facility] for neurogenic bladder, nephrolithiasis, UTI. He underwent CT scan of the abdomen 01/05/13 that showed incomplete imaging of a RLL mass. He is referred to evaluate further.   He does have cough, bothers him every few days. Productive of yellow phlegm. Rare CP in the mid-chest. He hears some noise when breathing, can happen at random.   ROV 05/26/13 -- returns s/p EBUS with nodal bx's RUL lesion. Cytology shows granulomatous disease, no evidence AFB or fungal on smears. He denies any dyspnea. He does notice some cough and wheeze, especially when getting up in the am.    Review of Systems  Constitutional: Positive for diaphoresis. Negative for fever and unexpected weight change.  HENT: Negative for ear pain, nosebleeds, congestion, sore throat, rhinorrhea, sneezing, trouble swallowing, dental problem, postnasal drip and sinus pressure.   Eyes: Negative for redness and itching.  Respiratory: Positive for chest tightness, shortness of breath and wheezing. Negative for cough.   Cardiovascular: Positive for chest pain. Negative for palpitations and leg swelling.  Gastrointestinal: Positive for abdominal pain. Negative for nausea and vomiting.  Genitourinary: Positive for dysuria and difficulty urinating.  Musculoskeletal: Negative for joint swelling.  Skin: Negative for rash.  Neurological: Negative for headaches.  Hematological: Does not bruise/bleed easily.  Psychiatric/Behavioral: Negative for dysphoric mood. The patient is not nervous/anxious.    Past Medical History  Diagnosis Date  . Spina bifida   . Hypertension   . Hyperlipidemia   . Kidney stones   . UTI (lower urinary tract infection)   . Depression     wife passed July 2013  . Shortness of breath     occassionally  . GERD (gastroesophageal reflux  disease)      Family History  Problem Relation Age of Onset  . Cancer Mother     ?lung  . Cancer Maternal Grandfather     lung     History   Social History  . Marital Status: Married    Spouse Name: N/A    Number of Children: 0  . Years of Education: N/A   Occupational History  . disability    Social History Main Topics  . Smoking status: Former Smoker -- 0.25 packs/day for 20 years    Types: Cigarettes    Quit date: 03/03/1993  . Smokeless tobacco: Never Used  . Alcohol Use: No  . Drug Use: No  . Sexually Active: No   Other Topics Concern  . Not on file   Social History Narrative  . No narrative on file     Allergies  Allergen Reactions  . Ivp Dye (Iodinated Diagnostic Agents) Itching     Outpatient Prescriptions Prior to Visit  Medication Sig Dispense Refill  . aspirin EC 81 MG tablet Take 81 mg by mouth daily.      . metoprolol (TOPROL-XL) 50 MG 24 hr tablet Take 50 mg by mouth every morning.       . simvastatin (ZOCOR) 40 MG tablet Take 40 mg by mouth at bedtime.        No facility-administered medications prior to visit.        Objective:   Physical Exam Filed Vitals:   05/26/13 1120  BP: 160/96  Pulse: 65  Temp: 97.9 F (36.6 C)  TempSrc: Oral  SpO2:  98%   Gen: Pleasant, obese man, in no distress,  normal affect  ENT: No lesions,  mouth clear,  oropharynx clear, no postnasal drip  Neck: No JVD, no TMG, no carotid bruits  Lungs: No use of accessory muscles,  clear without rales or rhonchi  Cardiovascular: RRR, heart sounds normal, no murmur or gallops, no peripheral edema  Musculoskeletal: No deformities, no cyanosis or clubbing  Neuro: alert, in wheelchair, able to move UE's  Skin: Warm, no lesions or rashes    ECG in 01/2013 >> no Q waves or ST changes, flattened T waves laterally  Spirometry today > poor technique (low exp time) > restriction     Assessment & Plan:  Sarcoidosis Discussed bx results - will start SABA  prn - CXR in 6 mo - eye exam annually

## 2013-05-26 NOTE — Patient Instructions (Addendum)
Your biopsies are consistent with sarcoidosis We will start albuterol 2 puffs as needed for shortness of breath, cough or wheezing You will need a formal eye exam every year Follow with Dr Delton Coombes in 6 months with a CXR, or sooner if you have any problems.

## 2013-05-27 ENCOUNTER — Telehealth: Payer: Self-pay | Admitting: Emergency Medicine

## 2013-05-27 NOTE — Telephone Encounter (Signed)
Spoke with patient, patient would like to know why Dr. Delton Coombes advised to get annual eye exams at last OV Patient Instructions    Your biopsies are consistent with sarcoidosis  We will start albuterol 2 puffs as needed for shortness of breath, cough or wheezing  You will need a formal eye exam every year  Follow with Dr Delton Coombes in 6 months with a CXR, or sooner if you have any problems.     I explained to the patient that since his biopsies were consistent w sarcoid it is important to make sure the sarcoid is not also effecting his eyes Patient verbalized understanding and nothing further needed at this time

## 2013-12-14 ENCOUNTER — Ambulatory Visit: Payer: Medicare Other | Admitting: Emergency Medicine

## 2013-12-28 ENCOUNTER — Ambulatory Visit: Payer: Medicare Other | Admitting: Emergency Medicine

## 2014-01-10 ENCOUNTER — Ambulatory Visit (INDEPENDENT_AMBULATORY_CARE_PROVIDER_SITE_OTHER): Payer: Medicare Other | Admitting: Emergency Medicine

## 2014-01-10 ENCOUNTER — Encounter: Payer: Self-pay | Admitting: Emergency Medicine

## 2014-01-10 VITALS — BP 120/82 | HR 70 | Ht 64.0 in | Wt 287.0 lb

## 2014-01-10 DIAGNOSIS — D869 Sarcoidosis, unspecified: Secondary | ICD-10-CM

## 2014-01-10 MED ORDER — BUDESONIDE-FORMOTEROL FUMARATE 160-4.5 MCG/ACT IN AERO: 2.0000 | INHALATION_SPRAY | Freq: Two times a day (BID) | RESPIRATORY_TRACT | Status: DC

## 2014-01-10 NOTE — Patient Instructions (Signed)
We will start Symbicort 2 puffs twice a day. Rinse out your mouth after using.  Call our office in about a month to report how you are feeling on this inhaler.  Continue to have albuterol to use 2 puffs if needed for shortness of breath Follow with Dr Lamonte Sakai in 4 months or sooner if you have any problems.

## 2014-01-10 NOTE — Progress Notes (Signed)
  Subjective:    Patient ID: Patrick Orozco, male    DOB: Dec 11, 1955, 58 y.o.   MRN: [id number]  HPI 58 yo former smoker (15-20 pk-yrs), hx HTN, was seen by Dr Tresa Moore, [facility] for neurogenic bladder, nephrolithiasis, UTI. He underwent CT scan of the abdomen 01/05/13 that showed incomplete imaging of a RLL mass. He is referred to evaluate further.   He does have cough, bothers him every few days. Productive of yellow phlegm. Rare CP in the mid-chest. He hears some noise when breathing, can happen at random.   ROV 05/26/13 -- returns s/p EBUS with nodal bx's RUL lesion. Cytology shows granulomatous disease, no evidence AFB or fungal on smears. He denies any dyspnea. He does notice some cough and wheeze, especially when getting up in the am.   ROV 01/10/14 -- follows up for sarcoidosis dx by nodal bx (EBUS).  He does have some dyspnea when he lays supine. He uses 2 pillows. Describes difficulty initiating sleep, he snores. He wakes well rested but then naps some during the day, occasionally unintentionally. Using SABA 2x a day.    Review of Systems  Constitutional: Positive for diaphoresis. Negative for fever and unexpected weight change.  HENT: Negative for congestion, dental problem, ear pain, nosebleeds, postnasal drip, rhinorrhea, sinus pressure, sneezing, sore throat and trouble swallowing.   Eyes: Negative for redness and itching.  Respiratory: Positive for chest tightness, shortness of breath and wheezing. Negative for cough.   Cardiovascular: Positive for chest pain. Negative for palpitations and leg swelling.  Gastrointestinal: Positive for abdominal pain. Negative for nausea and vomiting.  Genitourinary: Positive for dysuria and difficulty urinating.  Musculoskeletal: Negative for joint swelling.  Skin: Negative for rash.  Neurological: Negative for headaches.  Hematological: Does not bruise/bleed easily.  Psychiatric/Behavioral: Negative for dysphoric mood. The patient is not  nervous/anxious.        Objective:   Physical Exam Filed Vitals:   01/10/14 1505  BP: 120/82  Pulse: 70  Height: 5\' 4"  (1.626 m)  Weight: 287 lb (130.182 kg)  SpO2: 100%   Gen: Pleasant, obese man, in no distress,  normal affect  ENT: No lesions,  mouth clear,  oropharynx clear, no postnasal drip  Neck: No JVD, no TMG, no carotid bruits  Lungs: No use of accessory muscles,  clear without rales or rhonchi  Cardiovascular: RRR, heart sounds normal, no murmur or gallops, no peripheral edema  Musculoskeletal: No deformities, no cyanosis or clubbing  Neuro: alert, in wheelchair, able to move UE's  Skin: Warm, no lesions or rashes    ECG in 01/2013 >> no Q waves or ST changes, flattened T waves laterally  Spirometry today > poor technique (low exp time) > restriction     Assessment & Plan:  Sarcoidosis He has been stable. Uses SABA a couple times a day. We will try a scheduled BD to see if he benefits - start symbicort bid - albuterol prn - have him call to report any improvement - rov 4

## 2014-01-10 NOTE — Assessment & Plan Note (Signed)
He has been stable. Uses SABA a couple times a day. We will try a scheduled BD to see if he benefits - start symbicort bid - albuterol prn - have him call to report any improvement - rov 4

## 2014-02-10 ENCOUNTER — Telehealth: Payer: Self-pay | Admitting: Emergency Medicine

## 2014-02-10 NOTE — Telephone Encounter (Signed)
Called spoke with [employer] rep.  This will be sent up for further review and will receive notice in 48-72 hrs Case #: [id number] Will send to [place] to f/u on

## 2014-02-15 MED ORDER — FLUTICASONE-SALMETEROL 250-50 MCG/DOSE IN AEPB: 1.0000 | INHALATION_SPRAY | Freq: Two times a day (BID) | RESPIRATORY_TRACT | Status: DC

## 2014-02-15 NOTE — Telephone Encounter (Signed)
Meghan have you received anything? thanks

## 2014-02-15 NOTE — Telephone Encounter (Signed)
I have received the papers but have not yet had a chance to do them. Will try and do this afternoon

## 2014-02-15 NOTE — Telephone Encounter (Signed)
Per RB to change pt to advair 250/50.  I attempted to call pt to see if ok with him and lmtcb. Will send this medication into the pharm

## 2014-02-16 NOTE — Telephone Encounter (Signed)
Spoke with pt and advised him that adviar 250 was sent to pharm and to try this and if he does not do well we could then get symbicort approved due to him failing this medication. Pt verbalized understanding and will pick this medication up and have pharm show him how to use this inhaler. He will contact us with how he is doing with this new inhaler

## 2014-07-04 ENCOUNTER — Other Ambulatory Visit: Payer: Self-pay | Admitting: Emergency Medicine

## 2014-07-04 MED ORDER — FLUTICASONE-SALMETEROL 250-50 MCG/DOSE IN AEPB: 1.0000 | INHALATION_SPRAY | Freq: Two times a day (BID) | RESPIRATORY_TRACT | Status: DC

## 2014-07-13 ENCOUNTER — Encounter: Payer: Self-pay | Admitting: Emergency Medicine

## 2014-07-13 ENCOUNTER — Ambulatory Visit (INDEPENDENT_AMBULATORY_CARE_PROVIDER_SITE_OTHER): Payer: Medicare Other | Admitting: Emergency Medicine

## 2014-07-13 VITALS — BP 120/70 | HR 81 | Temp 98.6°F

## 2014-07-13 DIAGNOSIS — D869 Sarcoidosis, unspecified: Secondary | ICD-10-CM

## 2014-07-13 DIAGNOSIS — G4733 Obstructive sleep apnea (adult) (pediatric): Secondary | ICD-10-CM | POA: Insufficient documentation

## 2014-07-13 MED ORDER — BUDESONIDE-FORMOTEROL FUMARATE 160-4.5 MCG/ACT IN AERO: 2.0000 | INHALATION_SPRAY | Freq: Two times a day (BID) | RESPIRATORY_TRACT | Status: DC

## 2014-07-13 NOTE — Addendum Note (Signed)
Addended by: Raymondo Band D on: 07/13/2014 02:00 PM   Modules accepted: Orders, Medications

## 2014-07-13 NOTE — Assessment & Plan Note (Signed)
-   change back from Advair to symbicort 160 > will need to do a PA for this - wil plan to repeat his CT chest in 04/2015

## 2014-07-13 NOTE — Patient Instructions (Signed)
We will stop Advair and change back to Symbicort 2 puffs twice a day. We will do the prior authorization to get back on Symbicort We will order a sleep study Follow with Dr Lamonte Sakai in 2 months or sooner if you have any problems.

## 2014-07-13 NOTE — Assessment & Plan Note (Signed)
Suspected OSA > will check split night PSG

## 2014-07-13 NOTE — Progress Notes (Signed)
  Subjective:    Patient ID: Patrick Orozco, male    DOB: 09-Jan-1956, 58 y.o.   MRN: [id number]  HPI 58 yo former smoker (15-20 pk-yrs), hx HTN, was seen by Dr Tresa Moore, [facility] for neurogenic bladder, nephrolithiasis, UTI. He underwent CT scan of the abdomen 01/05/13 that showed incomplete imaging of a RLL mass. He is referred to evaluate further.   He does have cough, bothers him every few days. Productive of yellow phlegm. Rare CP in the mid-chest. He hears some noise when breathing, can happen at random.   ROV 05/26/13 -- returns s/p EBUS with nodal bx's RUL lesion. Cytology shows granulomatous disease, no evidence AFB or fungal on smears. He denies any dyspnea. He does notice some cough and wheeze, especially when getting up in the am.   ROV 01/10/14 -- follows up for sarcoidosis dx by nodal bx (EBUS).  He does have some dyspnea when he lays supine. He uses 2 pillows. Describes difficulty initiating sleep, he snores. He wakes well rested but then naps some during the day, occasionally unintentionally. Using SABA 2x a day.   ROV 07/13/14 -- follow up visit for sarcoidosis. He has been on Symbicort, but we recently changed to Advair for insurance purposes. He is having some trouble w delivery, cough. No flares. He has some occasional SOB when supine. He snores.    Review of Systems  Constitutional: Negative for fever, diaphoresis and unexpected weight change.  HENT: Negative for congestion, dental problem, ear pain, nosebleeds, postnasal drip, rhinorrhea, sinus pressure, sneezing, sore throat and trouble swallowing.   Eyes: Negative for redness and itching.  Respiratory: Positive for shortness of breath. Negative for cough, chest tightness and wheezing.   Cardiovascular: Negative for chest pain, palpitations and leg swelling.  Gastrointestinal: Negative for nausea, vomiting and abdominal pain.  Genitourinary: Negative for dysuria and difficulty urinating.  Musculoskeletal: Negative for  joint swelling.  Skin: Negative for rash.  Neurological: Negative for headaches.  Hematological: Does not bruise/bleed easily.  Psychiatric/Behavioral: Negative for dysphoric mood. The patient is not nervous/anxious.        Objective:   Physical Exam Filed Vitals:   07/13/14 1333  BP: 120/70  Pulse: 81  Temp: 98.6 F (37 C)  TempSrc: Oral  SpO2: 96%   Gen: Pleasant, obese man, in no distress,  normal affect  ENT: No lesions,  mouth clear,  oropharynx clear, no postnasal drip  Neck: No JVD, no TMG, no carotid bruits  Lungs: No use of accessory muscles,  clear without rales or rhonchi  Cardiovascular: RRR, heart sounds normal, no murmur or gallops, no peripheral edema  Musculoskeletal: No deformities, no cyanosis or clubbing  Neuro: alert, in wheelchair, able to move UE's  Skin: Warm, no lesions or rashes    ECG in 01/2013 >> no Q waves or ST changes, flattened T waves laterally      Assessment & Plan:  Sarcoidosis - change back from Advair to symbicort 160 > will need to do a PA for this - wil plan to repeat his CT chest in 04/2015  OSA (obstructive sleep apnea) Suspected OSA > will check split night PSG

## 2014-07-27 ENCOUNTER — Telehealth: Payer: Self-pay | Admitting: Emergency Medicine

## 2014-07-27 NOTE — Telephone Encounter (Signed)
Called [employer] at [phone] to initiate the PA for the symbicort.  Given the information that the pt has tried and failed advair 250.  This could not be approved at this time and this will be sent in for further review from the pharmacist.  We will hear back in 24-48 hours.  Will hold this in my box to follow up on .

## 2014-07-27 NOTE — Telephone Encounter (Signed)
Cecilie Lowers from [facility] called back and stated that the symbicort has been denied since the pt only has a dx of sarcoidosis.  This is not an FDA approved dx for symbicort.  So we have 2 options:  1.  Dismiss this case and let the pt try Breo--this is covered by his insurance and does not need PA done.  Or if we can show that the pt has a DX of COPD or asthma.   2.  RB can do a Peer to Peer and provide evidence that symbicort has been effect for use of sarcodosis.    RB please advise. thanks

## 2014-07-28 MED ORDER — FLUTICASONE FUROATE-VILANTEROL 100-25 MCG/INH IN AEPB: 1.0000 | INHALATION_SPRAY | Freq: Every day | RESPIRATORY_TRACT | Status: DC

## 2014-07-28 NOTE — Telephone Encounter (Signed)
Spoke with pt. Aware of change in inhalers. Pt states that he will come pick up samples on Monday. Pt advised to let us know how he tolerates the new inhaler and we can send in an Rx at that time. Discount card also with samples. Pt aware that someone will need to show him how to use this inhaler. It is ONE puff DAILY. Note placed on bag for nurse to show how to use when picked up. Nothing further needed.

## 2014-07-28 NOTE — Telephone Encounter (Signed)
Change the symbicort to breo. Explain to pt thtt we are making the change.

## 2014-09-05 ENCOUNTER — Encounter (HOSPITAL_BASED_OUTPATIENT_CLINIC_OR_DEPARTMENT_OTHER): Payer: Medicare Other

## 2014-09-20 ENCOUNTER — Ambulatory Visit: Payer: Medicare Other | Admitting: Emergency Medicine

## 2014-11-16 ENCOUNTER — Ambulatory Visit: Payer: Medicare Other | Admitting: Physical Therapy

## 2014-12-06 ENCOUNTER — Other Ambulatory Visit: Payer: Self-pay | Admitting: *Deleted

## 2014-12-12 ENCOUNTER — Encounter: Payer: Self-pay | Admitting: Physical Therapy

## 2014-12-12 ENCOUNTER — Ambulatory Visit: Payer: Medicare Other | Attending: Family Medicine | Admitting: Physical Therapy

## 2014-12-12 DIAGNOSIS — M6281 Muscle weakness (generalized): Secondary | ICD-10-CM | POA: Insufficient documentation

## 2014-12-12 NOTE — Therapy (Signed)
[facility] [address] [place], [place], [address] Phone: [phone]   Fax:  [phone]  Physical Therapy Evaluation  Patient Details  Name: Patrick Orozco MRN: [id number] Date of Birth: 13-Mar-1956 Referring Provider:  Kristine Garbe, MD  Encounter Date: 2014/12/25      PT End of Session - 12-25-14 1245    Visit Number 1  G1   Number of Visits 1   Authorization Type Medicare/Medicaid   Authorization Time Period 25-Dec-2014 -  01-10-15   PT Start Time 6789   PT Stop Time 1120   PT Time Calculation (min) 57 min      Past Medical History  Diagnosis Date  . Spina bifida   . Hypertension   . Hyperlipidemia   . Kidney stones   . UTI (lower urinary tract infection)   . Depression     wife passed July 2013  . Shortness of breath     occassionally  . GERD (gastroesophageal reflux disease)     Past Surgical History  Procedure Laterality Date  . Spine surgery      multiple surgeries related to spina bifida  . Colostomy    . 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 Endobronchial ultrasound Bilateral 04/25/2013    Procedure: ENDOBRONCHIAL ULTRASOUND;  Surgeon: Collene Gobble, MD;  Location: [facility];  Service: Cardiopulmonary;  Laterality: Bilateral;    There were no vitals taken for this visit.  Visit Diagnosis:  Generalized muscle weakness - Plan: PT plan of care cert/re-cert    Please see scanned assessment for complete wheelchair evaluation.                                    G-Codes - 12-25-2014 1247    Functional Assessment Tool Used clinical judgment   Functional Limitation Mobility: Walking and moving around   Mobility: Walking and Moving Around Current Status [phone]) At least 80 percent but less than 100 percent impaired, limited or restricted   Mobility: Walking and Moving Around Goal Status [phone]) At least 80 percent but less than 100 percent impaired, limited  or restricted   Mobility: Walking and Moving Around Discharge Status [phone]) At least 80 percent but less than 100 percent impaired, limited or restricted       Problem List Patient Active Problem List   Diagnosis Date Noted  . OSA (obstructive sleep apnea) 07/13/2014  . UTI (urinary tract infection), bacterial 2/2 GNR's 01/07/2013  . Cardiac enzymes elevated 01/07/2013  . Atypical chest pain 01/07/2013  . H/O paraplegia 01/07/2013  . Recurrent kidney stones-non obstructive 01/07/2013  . Sarcoidosis 01/07/2013  . Nausea and vomiting 01/05/2013  . Acute pyelonephritis 01/05/2013  . Left flank pain 01/05/2013  . Thrombocytopenia 01/05/2013  . Hypertension 01/05/2013  . History of spina bifida 01/05/2013    Couper Juncaj Suzanne, PT 12-25-14, 12:55 PM  [facility] [address] [place], [place], [address] Phone: [phone]   Fax:  [phone]

## 2014-12-27 ENCOUNTER — Other Ambulatory Visit: Payer: Self-pay | Admitting: Emergency Medicine

## 2014-12-27 MED ORDER — BUDESONIDE-FORMOTEROL FUMARATE 160-4.5 MCG/ACT IN AERO: 2.0000 | INHALATION_SPRAY | Freq: Two times a day (BID) | RESPIRATORY_TRACT | Status: DC

## 2015-03-14 ENCOUNTER — Telehealth: Payer: Self-pay | Admitting: Emergency Medicine

## 2015-03-14 NOTE — Telephone Encounter (Signed)
Pt seen last 07/2014 and you stated change back from Advair to symbicort 160 > will need to do a PA for this. On 07/27/14 this is what you suggested when PA was denied. Change the symbicort to breo.   I spoke with pt and he states he is taking the round purple inhaler and that he has never taken Breo. We did receive the following message;  03/14/2015 04:58 PM Phone (Incoming) [facility] [employer]     providing alternative for symbicort which is Advair Diskis and HFA no cb needed   Please advise what the pt should be on.

## 2015-03-15 NOTE — Telephone Encounter (Signed)
Spoke with pt and he states he is doing fine on the Advair.  Will let us know if any problems arise.

## 2015-03-15 NOTE — Telephone Encounter (Signed)
If he is tolerating the Advair without side effects then he can continue this. We could also consider changing the Advair to [facility] version at some point in the future if he has problems with cough or UA irritation.

## 2015-06-15 ENCOUNTER — Inpatient Hospital Stay: Admit: 2015-06-15 | Payer: Self-pay | Admitting: Urology

## 2015-06-15 ENCOUNTER — Encounter (HOSPITAL_COMMUNITY)
Admission: EM | Disposition: A | Discharge status: Home / Self Care | Payer: Self-pay | Source: Home / Self Care | Attending: Urology

## 2015-06-15 ENCOUNTER — Emergency Department (HOSPITAL_COMMUNITY): Payer: Medicare Other | Admitting: Anesthesiology

## 2015-06-15 ENCOUNTER — Emergency Department (HOSPITAL_COMMUNITY): Payer: Medicare Other

## 2015-06-15 ENCOUNTER — Encounter (HOSPITAL_COMMUNITY): Payer: Self-pay

## 2015-06-15 ENCOUNTER — Inpatient Hospital Stay (HOSPITAL_COMMUNITY)
Admission: EM | Admit: 2015-06-15 | Discharge: 2015-06-18 | DRG: 693 | Disposition: A | Discharge status: Home / Self Care | Payer: Medicare Other | Attending: Urology | Admitting: Urology

## 2015-06-15 DIAGNOSIS — I10 Essential (primary) hypertension: Secondary | ICD-10-CM | POA: Diagnosis present

## 2015-06-15 DIAGNOSIS — Z89429 Acquired absence of other toe(s), unspecified side: Secondary | ICD-10-CM | POA: Diagnosis not present

## 2015-06-15 DIAGNOSIS — G8250 Quadriplegia, unspecified: Secondary | ICD-10-CM | POA: Diagnosis present

## 2015-06-15 DIAGNOSIS — Z87891 Personal history of nicotine dependence: Secondary | ICD-10-CM

## 2015-06-15 DIAGNOSIS — G4730 Sleep apnea, unspecified: Secondary | ICD-10-CM | POA: Diagnosis present

## 2015-06-15 DIAGNOSIS — N319 Neuromuscular dysfunction of bladder, unspecified: Secondary | ICD-10-CM | POA: Diagnosis present

## 2015-06-15 DIAGNOSIS — Z79899 Other long term (current) drug therapy: Secondary | ICD-10-CM | POA: Diagnosis not present

## 2015-06-15 DIAGNOSIS — L89159 Pressure ulcer of sacral region, unspecified stage: Secondary | ICD-10-CM | POA: Diagnosis present

## 2015-06-15 DIAGNOSIS — N202 Calculus of kidney with calculus of ureter: Secondary | ICD-10-CM | POA: Diagnosis present

## 2015-06-15 DIAGNOSIS — N200 Calculus of kidney: Secondary | ICD-10-CM

## 2015-06-15 DIAGNOSIS — N390 Urinary tract infection, site not specified: Secondary | ICD-10-CM

## 2015-06-15 DIAGNOSIS — N12 Tubulo-interstitial nephritis, not specified as acute or chronic: Secondary | ICD-10-CM | POA: Diagnosis present

## 2015-06-15 DIAGNOSIS — R109 Unspecified abdominal pain: Secondary | ICD-10-CM | POA: Diagnosis present

## 2015-06-15 DIAGNOSIS — Z933 Colostomy status: Secondary | ICD-10-CM

## 2015-06-15 DIAGNOSIS — Q059 Spina bifida, unspecified: Secondary | ICD-10-CM

## 2015-06-15 DIAGNOSIS — Z993 Dependence on wheelchair: Secondary | ICD-10-CM

## 2015-06-15 DIAGNOSIS — F329 Major depressive disorder, single episode, unspecified: Secondary | ICD-10-CM | POA: Diagnosis present

## 2015-06-15 DIAGNOSIS — K219 Gastro-esophageal reflux disease without esophagitis: Secondary | ICD-10-CM | POA: Diagnosis present

## 2015-06-15 DIAGNOSIS — N132 Hydronephrosis with renal and ureteral calculous obstruction: Secondary | ICD-10-CM | POA: Diagnosis present

## 2015-06-15 DIAGNOSIS — Z87442 Personal history of urinary calculi: Secondary | ICD-10-CM | POA: Diagnosis not present

## 2015-06-15 DIAGNOSIS — E785 Hyperlipidemia, unspecified: Secondary | ICD-10-CM | POA: Diagnosis present

## 2015-06-15 DIAGNOSIS — Z809 Family history of malignant neoplasm, unspecified: Secondary | ICD-10-CM

## 2015-06-15 HISTORY — PX: CYSTOSCOPY W/ URETERAL STENT PLACEMENT: SHX1429

## 2015-06-15 LAB — URINALYSIS, ROUTINE W REFLEX MICROSCOPIC
BILIRUBIN URINE: NEGATIVE
GLUCOSE, UA: NEGATIVE mg/dL
KETONES UR: NEGATIVE mg/dL
Nitrite: POSITIVE — AB
PH: 5.5 (ref 5.0–8.0)
PROTEIN: NEGATIVE mg/dL
Specific Gravity, Urine: 1.018 (ref 1.005–1.030)
Urobilinogen, UA: 1 mg/dL (ref 0.0–1.0)

## 2015-06-15 LAB — CBC WITH DIFFERENTIAL/PLATELET
BASOS ABS: 0 10*3/uL (ref 0.0–0.1)
Basophils Relative: 0 % (ref 0–1)
EOS PCT: 0 % (ref 0–5)
Eosinophils Absolute: 0 10*3/uL (ref 0.0–0.7)
HEMATOCRIT: 43.1 % (ref 39.0–52.0)
HEMOGLOBIN: 14.4 g/dL (ref 13.0–17.0)
LYMPHS PCT: 2 % — AB (ref 12–46)
Lymphs Abs: 0.6 10*3/uL — ABNORMAL LOW (ref 0.7–4.0)
MCH: 30.6 pg (ref 26.0–34.0)
MCHC: 33.4 g/dL (ref 30.0–36.0)
MCV: 91.7 fL (ref 78.0–100.0)
Monocytes Absolute: 1.1 10*3/uL — ABNORMAL HIGH (ref 0.1–1.0)
Monocytes Relative: 4 % (ref 3–12)
Neutro Abs: 23.9 10*3/uL — ABNORMAL HIGH (ref 1.7–7.7)
Neutrophils Relative %: 93 % — ABNORMAL HIGH (ref 43–77)
PLATELETS: 131 10*3/uL — AB (ref 150–400)
RBC: 4.7 MIL/uL (ref 4.22–5.81)
RDW: 13.9 % (ref 11.5–15.5)
WBC: 25.6 10*3/uL — ABNORMAL HIGH (ref 4.0–10.5)

## 2015-06-15 LAB — COMPREHENSIVE METABOLIC PANEL
ALBUMIN: 3.2 g/dL — AB (ref 3.5–5.0)
ALK PHOS: 88 U/L (ref 38–126)
ALT: 25 U/L (ref 17–63)
AST: 30 U/L (ref 15–41)
Anion gap: 8 (ref 5–15)
BUN: 19 mg/dL (ref 6–20)
CO2: 19 mmol/L — ABNORMAL LOW (ref 22–32)
Calcium: 9.5 mg/dL (ref 8.9–10.3)
Chloride: 109 mmol/L (ref 101–111)
Creatinine, Ser: 1.35 mg/dL — ABNORMAL HIGH (ref 0.61–1.24)
GFR calc Af Amer: >60 mL/min (ref >60)
GFR, EST NON AFRICAN AMERICAN: 56 mL/min — AB (ref >60)
Glucose, Bld: 119 mg/dL — ABNORMAL HIGH (ref 65–99)
POTASSIUM: 4.2 mmol/L (ref 3.5–5.1)
Sodium: 136 mmol/L (ref 135–145)
Total Bilirubin: 0.9 mg/dL (ref 0.3–1.2)
Total Protein: 7.4 g/dL (ref 6.5–8.1)

## 2015-06-15 LAB — URINE MICROSCOPIC-ADD ON

## 2015-06-15 LAB — I-STAT CG4 LACTIC ACID, ED: Lactic Acid, Venous: 2 mmol/L (ref 0.5–2.0)

## 2015-06-15 SURGERY — CYSTOSCOPY, FLEXIBLE, WITH STENT REPLACEMENT
Anesthesia: General | Site: Ureter | Laterality: Left

## 2015-06-15 MED ORDER — LIDOCAINE HCL (CARDIAC) 20 MG/ML IV SOLN
INTRAVENOUS | Status: DC | PRN
  Administered 2015-06-15: 50 mg via INTRAVENOUS

## 2015-06-15 MED ORDER — MORPHINE SULFATE 4 MG/ML IJ SOLN
4.0000 mg | Freq: Once | INTRAMUSCULAR | Status: AC
  Administered 2015-06-15: 4 mg via INTRAVENOUS
  Filled 2015-06-15: qty 1

## 2015-06-15 MED ORDER — SUCCINYLCHOLINE CHLORIDE 20 MG/ML IJ SOLN
INTRAMUSCULAR | Status: DC | PRN
  Administered 2015-06-15: 100 mg via INTRAVENOUS

## 2015-06-15 MED ORDER — PROPOFOL 10 MG/ML IV BOLUS
INTRAVENOUS | Status: AC
  Filled 2015-06-15: qty 20

## 2015-06-15 MED ORDER — LACTATED RINGERS IV SOLN: INTRAVENOUS | Status: DC

## 2015-06-15 MED ORDER — FENTANYL CITRATE (PF) 100 MCG/2ML IJ SOLN
INTRAMUSCULAR | Status: AC
  Filled 2015-06-15: qty 4

## 2015-06-15 MED ORDER — PHENYLEPHRINE HCL 10 MG/ML IJ SOLN
INTRAMUSCULAR | Status: DC | PRN
  Administered 2015-06-15: 40 ug via INTRAVENOUS
  Administered 2015-06-15 (×2): 80 ug via INTRAVENOUS

## 2015-06-15 MED ORDER — DEXTROSE 5 % IV SOLN
1.0000 g | Freq: Once | INTRAVENOUS | Status: AC
  Administered 2015-06-15: 1 g via INTRAVENOUS
  Filled 2015-06-15: qty 10

## 2015-06-15 MED ORDER — PHENYLEPHRINE 40 MCG/ML (10ML) SYRINGE FOR IV PUSH (FOR BLOOD PRESSURE SUPPORT)
PREFILLED_SYRINGE | INTRAVENOUS | Status: AC
  Filled 2015-06-15: qty 10

## 2015-06-15 MED ORDER — EPHEDRINE SULFATE 50 MG/ML IJ SOLN
INTRAMUSCULAR | Status: DC | PRN
  Administered 2015-06-15 (×2): 5 mg via INTRAVENOUS

## 2015-06-15 MED ORDER — MEPERIDINE HCL 50 MG/ML IJ SOLN: 6.2500 mg | INTRAMUSCULAR | Status: DC | PRN

## 2015-06-15 MED ORDER — SODIUM CHLORIDE 0.9 % IV BOLUS (SEPSIS)
1000.0000 mL | Freq: Once | INTRAVENOUS | Status: AC
  Administered 2015-06-15: 1000 mL via INTRAVENOUS

## 2015-06-15 MED ORDER — HYDROMORPHONE HCL 1 MG/ML IJ SOLN: 0.2500 mg | INTRAMUSCULAR | Status: DC | PRN

## 2015-06-15 MED ORDER — SODIUM CHLORIDE 0.9 % IJ SOLN
INTRAMUSCULAR | Status: AC
  Filled 2015-06-15: qty 10

## 2015-06-15 MED ORDER — MIDAZOLAM HCL 2 MG/2ML IJ SOLN
INTRAMUSCULAR | Status: AC
  Filled 2015-06-15: qty 4

## 2015-06-15 MED ORDER — LIDOCAINE HCL (CARDIAC) 20 MG/ML IV SOLN
INTRAVENOUS | Status: AC
  Filled 2015-06-15: qty 5

## 2015-06-15 MED ORDER — ONDANSETRON HCL 4 MG/2ML IJ SOLN: 4.0000 mg | Freq: Once | INTRAMUSCULAR | Status: DC | PRN

## 2015-06-15 MED ORDER — MIDAZOLAM HCL 5 MG/5ML IJ SOLN
INTRAMUSCULAR | Status: DC | PRN
  Administered 2015-06-15: 2 mg via INTRAVENOUS

## 2015-06-15 MED ORDER — LACTATED RINGERS IV SOLN
INTRAVENOUS | Status: DC | PRN
  Administered 2015-06-15: 22:00:00 via INTRAVENOUS

## 2015-06-15 MED ORDER — EPHEDRINE SULFATE 50 MG/ML IJ SOLN
INTRAMUSCULAR | Status: AC
  Filled 2015-06-15: qty 1

## 2015-06-15 MED ORDER — ONDANSETRON HCL 4 MG/2ML IJ SOLN
4.0000 mg | Freq: Once | INTRAMUSCULAR | Status: AC
  Administered 2015-06-15: 4 mg via INTRAVENOUS
  Filled 2015-06-15: qty 2

## 2015-06-15 MED ORDER — PROPOFOL 10 MG/ML IV BOLUS
INTRAVENOUS | Status: DC | PRN
  Administered 2015-06-15: 200 mg via INTRAVENOUS

## 2015-06-15 SURGICAL SUPPLY — 12 items
BAG URO CATCHER STRL LF (DRAPE) ×3 IMPLANT
CATH INTERMIT  6FR 70CM (CATHETERS) IMPLANT
CLOTH BEACON ORANGE TIMEOUT ST (SAFETY) ×3 IMPLANT
GLOVE BIOGEL M STRL SZ7.5 (GLOVE) ×3 IMPLANT
GOWN STRL REUS W/TWL LRG LVL3 (GOWN DISPOSABLE) ×6 IMPLANT
GUIDEWIRE ANG ZIPWIRE 038X150 (WIRE) IMPLANT
GUIDEWIRE STR DUAL SENSOR (WIRE) ×3 IMPLANT
MANIFOLD NEPTUNE II (INSTRUMENTS) ×3 IMPLANT
PACK CYSTO (CUSTOM PROCEDURE TRAY) ×3 IMPLANT
STENT URET 6FRX24 CONTOUR (STENTS) ×3 IMPLANT
TUBING CONNECTING 10 (TUBING) ×2 IMPLANT
TUBING CONNECTING 10' (TUBING) ×1

## 2015-06-15 NOTE — Transfer of Care (Signed)
Immediate Anesthesia Transfer of Care Note  Patient: Patrick Orozco  Procedure(s) Performed: Procedure(s): CYSTOSCOPY WITH STENT REPLACEMENT (Left)  Patient Location: PACU  Anesthesia Type:General  Level of Consciousness:  sedated, patient cooperative and responds to stimulation  Airway & Oxygen Therapy:Patient Spontanous Breathing and Patient connected to face mask oxgen  Post-op Assessment:  Report given to PACU RN and Post -op Vital signs reviewed and stable  Post vital signs:  Reviewed and stable  Last Vitals:  Filed Vitals:   06/15/15 2315  BP: 132/76  Pulse: 89  Temp:   Resp: 28    Complications: No apparent anesthesia complications

## 2015-06-15 NOTE — H&P (Signed)
Urology Consult   Physician requesting consult: Dr. Darl Householder  Reason for consult: Left ureteral stone and UTI  History of Present Illness: Patrick Orozco is a 59 y.o. gentleman with spina bifida followed by Dr. Tresa Moore for his neurogenic bladder (managed with spontaneous voiding into a condom catheter) and urolithiasis.  He has known left renal calculi that have been observed.  He has not been symptomatic in the past and has not previously required surgical intervention for his stones.  He presented to the ED today with complaints of severe left flank pain with nausea and vomiting.  CT imaging revealed a 6 mm distal left ureteral stone with hydroureteronephrosis and large burden left renal calculi as previously noted.  He has had subjective chills although no documented objective fever. He was found to have a leukocytosis with a WBC of 25.4.   Past Medical History  Diagnosis Date  . Spina bifida   . Hypertension   . Hyperlipidemia   . Kidney stones   . UTI (lower urinary tract infection)   . Depression     wife passed July 2013  . Shortness of breath     occassionally  . GERD (gastroesophageal reflux disease)   . Kidney stones     Past Surgical History  Procedure Laterality Date  . Spine surgery      multiple surgeries related to spina bifida  . Colostomy    . 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 Endobronchial ultrasound Bilateral 04/25/2013    Procedure: ENDOBRONCHIAL ULTRASOUND;  Surgeon: Collene Gobble, MD;  Location: [facility];  Service: Cardiopulmonary;  Laterality: Bilateral;    Current Hospital Medications:  Home Meds:    Medication List    ASK your doctor about these medications        Fluticasone-Salmeterol 250-50 MCG/DOSE Aepb  Commonly known as:  ADVAIR  Inhale 1 puff into the lungs 2 (two) times daily.     metoprolol succinate 50 MG 24 hr tablet  Commonly known as:  TOPROL-XL  Take 50 mg by mouth every morning.     simvastatin 20 MG tablet   Commonly known as:  ZOCOR  Take 20 mg by mouth daily.        Scheduled Meds:  Continuous Infusions: . cefTRIAXone (ROCEPHIN)  IV 1 g (06/15/15 2042)   PRN Meds:.  Allergies:  Allergies  Allergen Reactions  . Ivp Dye [Iodinated Diagnostic Agents] Itching    Family History  Problem Relation Age of Onset  . Cancer Mother     ?lung  . Cancer Maternal Grandfather     lung    Social History:  reports that he quit smoking about 24 years ago. His smoking use included Cigarettes. He has a 5 pack-year smoking history. He has never used smokeless tobacco. He reports that he does not drink alcohol or use illicit drugs.  ROS: A complete review of systems was performed.  All systems are negative except for pertinent findings as noted.  Physical Exam:  Vital signs in last 24 hours: Temp:  [97.6 F (36.4 C)-98.1 F (36.7 C)] 97.6 F (36.4 C) (08/12 1951) Pulse Rate:  [75-106] 80 (08/12 1951) Resp:  [16-20] 20 (08/12 1951) BP: (105-125)/(65-85) 105/69 mmHg (08/12 1951) SpO2:  [95 %-98 %] 98 % (08/12 1951) Constitutional:  Alert and oriented, No acute distress Cardiovascular: Regular rate and rhythm, No JVD Respiratory: Normal respiratory effort, Lungs clear bilaterally GI: Abdomen is soft, nontender, nondistended, no abdominal masses,  colostomy noted in LLQ GU: Moderate L CVAT, condom cath in place Lymphatic: No lymphadenopathy Psychiatric: Normal mood and affect  Laboratory Data:   Recent Labs  06/15/15 1636  WBC 25.6*  HGB 14.4  HCT 43.1  PLT 131*     Recent Labs  06/15/15 1636  NA 136  K 4.2  CL 109  GLUCOSE 119*  BUN 19  CALCIUM 9.5  CREATININE 1.35*     Results for orders placed or performed during the hospital encounter of 06/15/15 (from the past 24 hour(s))  CBC with Differential     Status: Abnormal   Collection Time: 06/15/15  4:36 PM  Result Value Ref Range   WBC 25.6 (H) 4.0 - 10.5 K/uL   RBC 4.70 4.22 - 5.81 MIL/uL   Hemoglobin 14.4 13.0 -  17.0 g/dL   HCT 43.1 39.0 - 52.0 %   MCV 91.7 78.0 - 100.0 fL   MCH 30.6 26.0 - 34.0 pg   MCHC 33.4 30.0 - 36.0 g/dL   RDW 13.9 11.5 - 15.5 %   Platelets 131 (L) 150 - 400 K/uL   Neutrophils Relative % 93 (H) 43 - 77 %   Neutro Abs 23.9 (H) 1.7 - 7.7 K/uL   Lymphocytes Relative 2 (L) 12 - 46 %   Lymphs Abs 0.6 (L) 0.7 - 4.0 K/uL   Monocytes Relative 4 3 - 12 %   Monocytes Absolute 1.1 (H) 0.1 - 1.0 K/uL   Eosinophils Relative 0 0 - 5 %   Eosinophils Absolute 0.0 0.0 - 0.7 K/uL   Basophils Relative 0 0 - 1 %   Basophils Absolute 0.0 0.0 - 0.1 K/uL  Comprehensive metabolic panel     Status: Abnormal   Collection Time: 06/15/15  4:36 PM  Result Value Ref Range   Sodium 136 135 - 145 mmol/L   Potassium 4.2 3.5 - 5.1 mmol/L   Chloride 109 101 - 111 mmol/L   CO2 19 (L) 22 - 32 mmol/L   Glucose, Bld 119 (H) 65 - 99 mg/dL   BUN 19 6 - 20 mg/dL   Creatinine, Ser 1.35 (H) 0.61 - 1.24 mg/dL   Calcium 9.5 8.9 - 10.3 mg/dL   Total Protein 7.4 6.5 - 8.1 g/dL   Albumin 3.2 (L) 3.5 - 5.0 g/dL   AST 30 15 - 41 U/L   ALT 25 17 - 63 U/L   Alkaline Phosphatase 88 38 - 126 U/L   Total Bilirubin 0.9 0.3 - 1.2 mg/dL   GFR calc non Af Amer 56 (L) >60 mL/min   GFR calc Af Amer >60 >60 mL/min   Anion gap 8 5 - 15  I-Stat CG4 Lactic Acid, ED     Status: None   Collection Time: 06/15/15  6:01 PM  Result Value Ref Range   Lactic Acid, Venous 2.00 0.5 - 2.0 mmol/L  Urinalysis, Routine w reflex microscopic-may I&O cath if menses (not at [facility])     Status: Abnormal   Collection Time: 06/15/15  6:40 PM  Result Value Ref Range   Color, Urine AMBER (A) YELLOW   APPearance CLOUDY (A) CLEAR   Specific Gravity, Urine 1.018 1.005 - 1.030   pH 5.5 5.0 - 8.0   Glucose, UA NEGATIVE NEGATIVE mg/dL   Hgb urine dipstick SMALL (A) NEGATIVE   Bilirubin Urine NEGATIVE NEGATIVE   Ketones, ur NEGATIVE NEGATIVE mg/dL   Protein, ur NEGATIVE NEGATIVE mg/dL   Urobilinogen, UA 1.0 0.0 - 1.0 mg/dL  Nitrite POSITIVE  (A) NEGATIVE   Leukocytes, UA MODERATE (A) NEGATIVE  Urine microscopic-add on     Status: Abnormal   Collection Time: 06/15/15  6:40 PM  Result Value Ref Range   Squamous Epithelial / LPF FEW (A) RARE   WBC, UA 21-50 <3 WBC/hpf   RBC / HPF 0-2 <3 RBC/hpf   Bacteria, UA MANY (A) RARE   No results found for this or any previous visit (from the past 240 hour(s)).  Renal Function:  Recent Labs  06/15/15 1636  CREATININE 1.35*   CrCl cannot be calculated (Unknown ideal weight.).  Radiologic Imaging: Ct Renal Stone Study  06/15/2015   CLINICAL DATA:  Three day history of left flank pain.  EXAM: CT ABDOMEN AND PELVIS WITHOUT CONTRAST  TECHNIQUE: Multidetector CT imaging of the abdomen and pelvis was performed following the standard protocol without IV contrast.  COMPARISON:  CT scan 09/14/2014  FINDINGS: Lower chest: No acute pulmonary findings. Minimal patchy subsegmental dependent atelectasis. Stable by a hilar adenopathy likely due to sarcoidosis. The distal esophagus is grossly normal.  Hepatobiliary: No focal hepatic lesions or intrahepatic biliary dilatation. The gallbladder demonstrates a single gallstone near the neck. This is stable. No common bile duct dilatation.  Pancreas: No mass, inflammation or ductal dilatation.  Spleen: Normal size.  No focal lesions.  Adrenals/Urinary Tract: The adrenal glands are normal and stable. There are right renal cysts. No right-sided renal or ureteral calculi.  The left kidney demonstrates marked hydronephrosis and left hydroureter down to an obstructing the 6 x 6 mm calculus in the distal left ureter above the UVJ. The bladder is decompressed.  Stomach/Bowel: The stomach, duodenum, small bowel and colon are grossly normal without oral contrast. No inflammatory changes, mass lesions or obstructive findings. The appendix is normal. The terminal ileum is normal. A left lower quadrant colostomy is noted.  Vascular/Lymphatic: No mesenteric or retroperitoneal  mass or adenopathy. The aorta is normal in caliber. Atherosclerotic calcifications involving the proximal iliac arteries.  Other: No pelvic mass or adenopathy. The uterus is surgically absent.  Musculoskeletal: Chronic bony changes in the pelvis and lumbar scoliosis. Severe left hip joint degenerative changes appears stable.  IMPRESSION: 1. 6 x 6 mm distal left ureteral calculus causing high-grade obstruction. 2. Large lower pole left renal calculus measuring a maximum of 21 mm. 3. 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 4. Stable bilateral hilar adenopathy likely due to sarcoidosis. 5. Stable cholelithiasis.   Electronically Signed   By: Marijo Sanes M.D.   On: 06/15/2015 16:16    I independently reviewed the above imaging studies.  Impression/Recommendation Left ureteral stone and UTI with leukocytosis - I recommended proceeding with cystoscopy and left ureteral stent placement.  Continue ceftriaxone pending urine cultures.  Will admit postoperatively for IV antibiotic therapy pending final cultures.  Dewayne Jurek,LES 06/15/2015, 9:04 PM    Pryor Curia MD   CC: Dr. Shirlyn Goltz

## 2015-06-15 NOTE — ED Provider Notes (Signed)
CSN: [id number]     Arrival date & time 06/15/15  1524 History   First MD Initiated Contact with Patient 06/15/15 1540     Chief Complaint  Patient presents with  . Flank Pain  . Abdominal Pain     (Consider location/radiation/quality/duration/timing/severity/associated sxs/prior Treatment) The history is provided by the patient.  Patrick Orozco is a 59 y.o. male hx of spinal bifida (wheelchair bound), recurrent UTIs, kidney stone, here with L flank and LLQ pain. Left flank pain and left lower quadrant pain since yesterday. He states that this is similar to his previous kidney stones. Also vomited 3 times. Patient has a condom cath chronically due to spina bifida. Denies any fevers or chills. Has colostomy bag on with brown stool.    Past Medical History  Diagnosis Date  . Spina bifida   . Hypertension   . Hyperlipidemia   . Kidney stones   . UTI (lower urinary tract infection)   . Depression     wife passed July 2013  . Shortness of breath     occassionally  . GERD (gastroesophageal reflux disease)   . Kidney stones    Past Surgical History  Procedure Laterality Date  . Spine surgery      multiple surgeries related to spina bifida  . Colostomy    . 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 Endobronchial ultrasound Bilateral 04/25/2013    Procedure: ENDOBRONCHIAL ULTRASOUND;  Surgeon: Collene Gobble, MD;  Location: [facility];  Service: Cardiopulmonary;  Laterality: Bilateral;   Family History  Problem Relation Age of Onset  . Cancer Mother     ?lung  . Cancer Maternal Grandfather     lung   Social History  Substance Use Topics  . Smoking status: Former Smoker -- 0.25 packs/day for 20 years    Types: Cigarettes    Quit date: 03/04/1991  . Smokeless tobacco: Never Used  . Alcohol Use: No    Review of Systems  Gastrointestinal: Positive for abdominal pain.  Genitourinary: Positive for flank pain.  All other systems reviewed and are  negative.     Allergies  Ivp dye  Home Medications   Prior to Admission medications   Medication Sig Start Date End Date Taking? Authorizing Provider  Fluticasone-Salmeterol (ADVAIR) 250-50 MCG/DOSE AEPB Inhale 1 puff into the lungs 2 (two) times daily.   Yes Historical Provider, MD  metoprolol (TOPROL-XL) 50 MG 24 hr tablet Take 50 mg by mouth every morning.    Yes Historical Provider, MD  simvastatin (ZOCOR) 20 MG tablet Take 20 mg by mouth daily.   Yes Historical Provider, MD   BP 105/69 mmHg  Pulse 80  Temp(Src) 97.6 F (36.4 C) (Oral)  Resp 20  SpO2 98% Physical Exam  Constitutional: He is oriented to person, place, and time.  Chronically ill   HENT:  Head: Normocephalic.  Eyes: Conjunctivae are normal. Pupils are equal, round, and reactive to light.  Neck: Normal range of motion. Neck supple.  Cardiovascular: Normal rate, regular rhythm and normal heart sounds.   Pulmonary/Chest: Effort normal and breath sounds normal. No respiratory distress. He has no wheezes. He has no rales.  Abdominal: Soft. Bowel sounds are normal.  Colostomy LLQ, minimal tenderness.   Musculoskeletal: Normal range of motion. He exhibits no edema or tenderness.  Neurological: He is alert and oriented to person, place, and time. No cranial nerve deficit. Coordination normal.  Skin: Skin is warm and dry.  Psychiatric: He has a normal mood and affect. His behavior is normal. Judgment and thought content normal.  Nursing note and vitals reviewed.   ED Course  Procedures (including critical care time) Labs Review Labs Reviewed  URINALYSIS, ROUTINE W REFLEX MICROSCOPIC (NOT AT [facility]) - Abnormal; Notable for the following:    Color, Urine AMBER (*)    APPearance CLOUDY (*)    Hgb urine dipstick SMALL (*)    Nitrite POSITIVE (*)    Leukocytes, UA MODERATE (*)    All other components within normal limits  CBC WITH DIFFERENTIAL/PLATELET - Abnormal; Notable for the following:    WBC 25.6 (*)     Platelets 131 (*)    Neutrophils Relative % 93 (*)    Neutro Abs 23.9 (*)    Lymphocytes Relative 2 (*)    Lymphs Abs 0.6 (*)    Monocytes Absolute 1.1 (*)    All other components within normal limits  COMPREHENSIVE METABOLIC PANEL - Abnormal; Notable for the following:    CO2 19 (*)    Glucose, Bld 119 (*)    Creatinine, Ser 1.35 (*)    Albumin 3.2 (*)    GFR calc non Af Amer 56 (*)    All other components within normal limits  URINE MICROSCOPIC-ADD ON - Abnormal; Notable for the following:    Squamous Epithelial / LPF FEW (*)    Bacteria, UA MANY (*)    All other components within normal limits  URINE CULTURE  I-STAT CG4 LACTIC ACID, ED    Imaging Review Ct Renal Stone Study  06/15/2015   CLINICAL DATA:  Three day history of left flank pain.  EXAM: CT ABDOMEN AND PELVIS WITHOUT CONTRAST  TECHNIQUE: Multidetector CT imaging of the abdomen and pelvis was performed following the standard protocol without IV contrast.  COMPARISON:  CT scan 09/14/2014  FINDINGS: Lower chest: No acute pulmonary findings. Minimal patchy subsegmental dependent atelectasis. Stable by a hilar adenopathy likely due to sarcoidosis. The distal esophagus is grossly normal.  Hepatobiliary: No focal hepatic lesions or intrahepatic biliary dilatation. The gallbladder demonstrates a single gallstone near the neck. This is stable. No common bile duct dilatation.  Pancreas: No mass, inflammation or ductal dilatation.  Spleen: Normal size.  No focal lesions.  Adrenals/Urinary Tract: The adrenal glands are normal and stable. There are right renal cysts. No right-sided renal or ureteral calculi.  The left kidney demonstrates marked hydronephrosis and left hydroureter down to an obstructing the 6 x 6 mm calculus in the distal left ureter above the UVJ. The bladder is decompressed.  Stomach/Bowel: The stomach, duodenum, small bowel and colon are grossly normal without oral contrast. No inflammatory changes, mass lesions or  obstructive findings. The appendix is normal. The terminal ileum is normal. A left lower quadrant colostomy is noted.  Vascular/Lymphatic: No mesenteric or retroperitoneal mass or adenopathy. The aorta is normal in caliber. Atherosclerotic calcifications involving the proximal iliac arteries.  Other: No pelvic mass or adenopathy. The uterus is surgically absent.  Musculoskeletal: Chronic bony changes in the pelvis and lumbar scoliosis. Severe left hip joint degenerative changes appears stable.  IMPRESSION: 1. 6 x 6 mm distal left ureteral calculus causing high-grade obstruction. 2. Large lower pole left renal calculus measuring a maximum of 21 mm. 3. 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 4. Stable bilateral hilar adenopathy likely due to sarcoidosis. 5. Stable cholelithiasis.   Electronically Signed   By: Marijo Sanes M.D.   On: 06/15/2015 16:16  I, Thelbert Gartin, personally reviewed and evaluated these images and lab results as part of my medical decision-making.   EKG Interpretation None      MDM   Final diagnoses:  Left flank pain    Woodrow T Anand is a 59 y.o. male here with ab pain, vomiting. Consider renal colic vs SBO. Will get labs, UA, CT renal stone. Doesn't want pain meds. Will hydrate and reassess.   8pm WBC 25. UA + UTI. CT showed 6 mm L sided stone with severe hydro with L renal stone. Consulted Dr. Alinda Money, who will see patient. Given ceftriaxone for possible UTI vs infected stone. Afebrile, nl lactate.   9:28 PM Dr. Alinda Money saw patient and will admit for stent placement.       Wandra Arthurs, MD 06/15/15 2129

## 2015-06-15 NOTE — Anesthesia Preprocedure Evaluation (Addendum)
Anesthesia Evaluation  Patient identified by MRN, date of birth, ID band Patient awake    Reviewed: Allergy & Precautions, NPO status , Patient's Chart, lab work & pertinent test results  Airway Mallampati: I  TM Distance: >3 FB Neck ROM: Full    Dental   Pulmonary sleep apnea , former smoker,    Pulmonary exam normal       Cardiovascular hypertension, Pt. on medications Normal cardiovascular exam    Neuro/Psych Depression H/O Spina Bifida    GI/Hepatic GERD-  Medicated and Controlled,  Endo/Other    Renal/GU CRFRenal disease     Musculoskeletal   Abdominal   Peds  Hematology   Anesthesia Other Findings   Reproductive/Obstetrics                            Anesthesia Physical Anesthesia Plan  ASA: III and emergent  Anesthesia Plan: General   Post-op Pain Management:    Induction: Intravenous  Airway Management Planned: Oral ETT  Additional Equipment:   Intra-op Plan:   Post-operative Plan: Extubation in OR  Informed Consent: I have reviewed the patients History and Physical, chart, labs and discussed the procedure including the risks, benefits and alternatives for the proposed anesthesia with the patient or authorized representative who has indicated his/her understanding and acceptance.     Plan Discussed with: CRNA and Surgeon  Anesthesia Plan Comments:         Anesthesia Quick Evaluation

## 2015-06-15 NOTE — ED Notes (Signed)
Pt's personal belongings placed in [place] locker #[id number].

## 2015-06-15 NOTE — ED Notes (Signed)
Per EMS- Patient c/o left flank and abdominal pain. Paitient has a history of kidney stones. Patient also c/o N/V x 3  Patient has a foley cath.

## 2015-06-15 NOTE — ED Notes (Addendum)
In and out catheter with urine return enough for sample.  Pt placed condom catheter (46m) on himself

## 2015-06-15 NOTE — Anesthesia Postprocedure Evaluation (Signed)
Anesthesia Post Note  Patient: Patrick Orozco  Procedure(s) Performed: Procedure(s) (LRB): CYSTOSCOPY WITH STENT REPLACEMENT (Left)  Anesthesia type: general  Patient location: PACU  Post pain: Pain level controlled  Post assessment: Patient's Cardiovascular Status Stable  Last Vitals:  Filed Vitals:   06/15/15 2315  BP: 132/76  Pulse: 89  Temp:   Resp: 28    Post vital signs: Reviewed and stable  Level of consciousness: sedated  Complications: No apparent anesthesia complications

## 2015-06-15 NOTE — Anesthesia Procedure Notes (Signed)
Procedure Name: Intubation Date/Time: 06/15/2015 10:15 PM Performed by: Anne Fu Pre-anesthesia Checklist: Patient identified, Emergency Drugs available, Suction available, Patient being monitored and Timeout performed Patient Re-evaluated:Patient Re-evaluated prior to inductionOxygen Delivery Method: Circle system utilized Preoxygenation: Pre-oxygenation with 100% oxygen Intubation Type: IV induction and Rapid sequence Laryngoscope Size: Mac and 4 Grade View: Grade I Tube type: Oral Tube size: 7.5 mm Number of attempts: 1 Airway Equipment and Method: Stylet Placement Confirmation: ETT inserted through vocal cords under direct vision,  positive ETCO2,  CO2 detector and breath sounds checked- equal and bilateral Secured at: 23 cm Tube secured with: Tape Dental Injury: Teeth and Oropharynx as per pre-operative assessment

## 2015-06-15 NOTE — Op Note (Signed)
Preoperative diagnosis:  1. Left ureteral and renal calculi 2. UTI and leukocytosis   Postoperative diagnosis:  1. Left ureteral and renal calculi 2. UTI and leukocytosis   Procedure:  1. Cystoscopy 2. Left ureteral stent placement (6 x 24) no string  Surgeon: Roxy Horseman, Brooke Bonito. M.D.  Anesthesia: General  Complications: None  EBL: Minimal  Specimens: None  Indication: Patrick Orozco is a 59 y.o. patient with a left ureteral stone, leukocytosis, and UTI along with subjective fever. After reviewing the management options for treatment, he elected to proceed with the above surgical procedure(s). We have discussed the potential benefits and risks of the procedure, side effects of the proposed treatment, the likelihood of the patient achieving the goals of the procedure, and any potential problems that might occur during the procedure or recuperation. Informed consent has been obtained.  Description of procedure:  The patient was taken to the operating room and general anesthesia was induced.  The patient was placed in the dorsal lithotomy position, prepped and draped in the usual sterile fashion, and preoperative antibiotics were administered. A preoperative time-out was performed.   Cystourethroscopy was performed.  The patient's urethra was examined and was normal. The bladder was then systematically examined in its entirety. There was no evidence for any bladder tumors, stones, or other mucosal pathology.    A 0.38 sensor guidewire was then advanced up the left ureter into the renal pelvis under fluoroscopic guidance.  The wire was then backloaded through the cystoscope and a ureteral stent was advance over the wire using Seldinger technique.  The stent was positioned appropriately under fluoroscopic and cystoscopic guidance.  The wire was then removed with an adequate stent curl noted in the renal pelvis as well as in the bladder.  The bladder was then emptied and the procedure  ended.  The patient appeared to tolerate the procedure well and without complications.  The patient was able to be awakened and transferred to the recovery unit in satisfactory condition.    Pryor Curia MD

## 2015-06-16 LAB — BASIC METABOLIC PANEL
ANION GAP: 8 (ref 5–15)
BUN: 18 mg/dL (ref 6–20)
CALCIUM: 8.6 mg/dL — AB (ref 8.9–10.3)
CHLORIDE: 110 mmol/L (ref 101–111)
CO2: 22 mmol/L (ref 22–32)
CREATININE: 1.31 mg/dL — AB (ref 0.61–1.24)
GFR, EST NON AFRICAN AMERICAN: 58 mL/min — AB (ref >60)
GLUCOSE: 133 mg/dL — AB (ref 65–99)
POTASSIUM: 4.2 mmol/L (ref 3.5–5.1)
Sodium: 140 mmol/L (ref 135–145)

## 2015-06-16 LAB — CBC
HCT: 38.2 % — ABNORMAL LOW (ref 39.0–52.0)
HEMOGLOBIN: 12.4 g/dL — AB (ref 13.0–17.0)
MCH: 30.2 pg (ref 26.0–34.0)
MCHC: 32.5 g/dL (ref 30.0–36.0)
MCV: 93.2 fL (ref 78.0–100.0)
Platelets: 106 10*3/uL — ABNORMAL LOW (ref 150–400)
RBC: 4.1 MIL/uL — ABNORMAL LOW (ref 4.22–5.81)
RDW: 14.3 % (ref 11.5–15.5)
WBC: 19.7 10*3/uL — AB (ref 4.0–10.5)

## 2015-06-16 MED ORDER — ENOXAPARIN SODIUM 40 MG/0.4ML ~~LOC~~ SOLN
40.0000 mg | SUBCUTANEOUS | Status: DC
  Administered 2015-06-16 – 2015-06-18 (×3): 40 mg via SUBCUTANEOUS
  Filled 2015-06-16 (×3): qty 0.4

## 2015-06-16 MED ORDER — HYDROCODONE-ACETAMINOPHEN 5-325 MG PO TABS
1.0000 | ORAL_TABLET | ORAL | Status: DC | PRN
  Administered 2015-06-17 – 2015-06-18 (×3): 2 via ORAL
  Filled 2015-06-16 (×3): qty 2

## 2015-06-16 MED ORDER — SIMVASTATIN 20 MG PO TABS
20.0000 mg | ORAL_TABLET | Freq: Every day | ORAL | Status: DC
  Administered 2015-06-16 – 2015-06-17 (×2): 20 mg via ORAL
  Filled 2015-06-16 (×3): qty 1

## 2015-06-16 MED ORDER — ONDANSETRON HCL 4 MG/2ML IJ SOLN: 4.0000 mg | INTRAMUSCULAR | Status: DC | PRN

## 2015-06-16 MED ORDER — METOPROLOL SUCCINATE ER 50 MG PO TB24
50.0000 mg | ORAL_TABLET | Freq: Every morning | ORAL | Status: DC
  Administered 2015-06-16 – 2015-06-18 (×3): 50 mg via ORAL
  Filled 2015-06-16 (×3): qty 1

## 2015-06-16 MED ORDER — ACETAMINOPHEN 325 MG PO TABS: 650.0000 mg | ORAL_TABLET | ORAL | Status: DC | PRN

## 2015-06-16 MED ORDER — MOMETASONE FURO-FORMOTEROL FUM 100-5 MCG/ACT IN AERO
2.0000 | INHALATION_SPRAY | Freq: Two times a day (BID) | RESPIRATORY_TRACT | Status: DC
  Administered 2015-06-16 – 2015-06-18 (×5): 2 via RESPIRATORY_TRACT
  Filled 2015-06-16: qty 8.8

## 2015-06-16 MED ORDER — KCL IN DEXTROSE-NACL 20-5-0.45 MEQ/L-%-% IV SOLN
INTRAVENOUS | Status: DC
Start: 2015-06-16 — End: 2015-06-18
  Administered 2015-06-16 – 2015-06-17 (×5): via INTRAVENOUS
  Filled 2015-06-16 (×6): qty 1000

## 2015-06-16 MED ORDER — DEXTROSE 5 % IV SOLN
1.0000 g | INTRAVENOUS | Status: DC
  Administered 2015-06-16 – 2015-06-17 (×2): 1 g via INTRAVENOUS
  Filled 2015-06-16 (×2): qty 10

## 2015-06-16 MED ORDER — SENNA 8.6 MG PO TABS
1.0000 | ORAL_TABLET | Freq: Two times a day (BID) | ORAL | Status: DC
  Administered 2015-06-16 – 2015-06-18 (×5): 8.6 mg via ORAL
  Filled 2015-06-16 (×5): qty 1

## 2015-06-16 MED ORDER — DIPHENHYDRAMINE HCL 50 MG/ML IJ SOLN
12.5000 mg | Freq: Four times a day (QID) | INTRAMUSCULAR | Status: DC | PRN
  Administered 2015-06-17: 12.5 mg via INTRAVENOUS
  Filled 2015-06-16: qty 1

## 2015-06-16 MED ORDER — DIPHENHYDRAMINE HCL 12.5 MG/5ML PO ELIX: 12.5000 mg | ORAL_SOLUTION | Freq: Four times a day (QID) | ORAL | Status: DC | PRN

## 2015-06-16 NOTE — Progress Notes (Signed)
Skin assessment done on patient by Tor Netters, RN and witnessed by Philemon Kingdom, RN, with the following findings: a 2 cm x 2.5 cm pinkish wound found on left posterior thigh; 6 cm x 2 cm pinkish, non-draining wound on sacrum noted; and a 3 cm x 0.5 cm healing, scabbed wound on left abdomen. Both thigh and sacral wounds were applied with allevyn dressing.

## 2015-06-16 NOTE — Progress Notes (Signed)
Patient ID: JIMMEY HENGEL, male   DOB: [birth year], 59 y.o.   MRN: [id number]  . 1 Day Post-Op Subjective: Pt feeling better.  Pain much improved.  No nausea or vomiting.  Objective: Vital signs in last 24 hours: Temp:  [97.6 F (36.4 C)-98.7 F (37.1 C)] 98.7 F (37.1 C) (08/13 0630) Pulse Rate:  [75-106] 78 (08/13 0630) Resp:  [16-30] 24 (08/13 0630) BP: (102-132)/(59-85) 102/60 mmHg (08/13 0630) SpO2:  [88 %-98 %] 97 % (08/13 0630) Weight:  [122.789 kg (270 lb 11.2 oz)] 122.789 kg (270 lb 11.2 oz) (08/13 0029)  Intake/Output from previous day: 08/12 0701 - 08/13 0700 In: 1500 [I.V.:500; IV Piggyback:1000] Out: 1200 [Urine:1200] Intake/Output this shift:    Physical Exam:  General: Alert and oriented CV: RRR Lungs: Clear Abdomen: Soft, ND, No CVAT   Lab Results:  Recent Labs  06/15/15 1636 06/16/15 0455  HGB 14.4 12.4*  HCT 43.1 38.2*   Lab Results  Component Value Date   WBC 19.7* 06/16/2015   HGB 12.4* 06/16/2015   HCT 38.2* 06/16/2015   MCV 93.2 06/16/2015   PLT 106* 06/16/2015    BMET  Recent Labs  06/15/15 1636 06/16/15 0455  NA 136 140  K 4.2 4.2  CL 109 110  CO2 19* 22  GLUCOSE 119* 133*  BUN 19 18  CREATININE 1.35* 1.31*  CALCIUM 9.5 8.6*     Studies/Results:  Assessment/Plan: Left ureteral and renal calculi with UTI: - WBC improving.  Continue ceftriaxone pending urine culture from yesterday. - Will require definitive management of ureteral stone and possibly renal calculi once infection resolved.   LOS: 1 day   Zacharie Portner,LES 06/16/2015, 7:47 AM

## 2015-06-17 LAB — CBC
HEMATOCRIT: 37.1 % — AB (ref 39.0–52.0)
Hemoglobin: 11.8 g/dL — ABNORMAL LOW (ref 13.0–17.0)
MCH: 29.7 pg (ref 26.0–34.0)
MCHC: 31.8 g/dL (ref 30.0–36.0)
MCV: 93.5 fL (ref 78.0–100.0)
Platelets: 88 10*3/uL — ABNORMAL LOW (ref 150–400)
RBC: 3.97 MIL/uL — AB (ref 4.22–5.81)
RDW: 14.5 % (ref 11.5–15.5)
WBC: 6.6 10*3/uL (ref 4.0–10.5)

## 2015-06-17 LAB — URINE CULTURE

## 2015-06-17 NOTE — Progress Notes (Signed)
Patient ID: Patrick Orozco, male   DOB: 03/26/1956, 59 y.o.   MRN: [id number]  2 Days Post-Op Subjective: Pt s/p left ureteral stent Friday night.  He continues to feel improved.  Has minimal lower back pain now.  Denies abdominal pain.  Has been eating and drinking well over last 24 hours with nausea and anorexia now resolved.  Objective: Vital signs in last 24 hours: Temp:  [98.4 F (36.9 C)-98.6 F (37 C)] 98.5 F (36.9 C) (08/14 0528) Pulse Rate:  [71-82] 82 (08/14 0528) Resp:  [18] 18 (08/14 0528) BP: (88-128)/(53-71) 128/71 mmHg (08/14 0829) SpO2:  [94 %-98 %] 97 % (08/14 0748)  Intake/Output from previous day: 08/13 0701 - 08/14 0700 In: -  Out: 5150 [Urine:5150] Intake/Output this shift:    Physical Exam:  General: Alert and oriented CV: RRR Lungs: Clear Abdomen: Soft, ND, no CVAT GU: Urine clear, condom catheter in place Ext: NT, No erythema  Lab Results:  Recent Labs  06/15/15 1636 06/16/15 0455 06/17/15 0725  HGB 14.4 12.4* 11.8*  HCT 43.1 38.2* 37.1*   CBC Latest Ref Rng 06/17/2015 06/16/2015 06/15/2015  WBC 4.0 - 10.5 K/uL 6.6 19.7(H) 25.6(H)  Hemoglobin 13.0 - 17.0 g/dL 11.8(L) 12.4(L) 14.4  Hematocrit 39.0 - 52.0 % 37.1(L) 38.2(L) 43.1  Platelets 150 - 400 K/uL 88(L) 106(L) 131(L)     BMET  Recent Labs  06/15/15 1636 06/16/15 0455  NA 136 140  K 4.2 4.2  CL 109 110  CO2 19* 22  GLUCOSE 119* 133*  BUN 19 18  CREATININE 1.35* 1.31*  CALCIUM 9.5 8.6*     Studies/Results: Urine culture is still pending.  Assessment/Plan: POD # 2 s/p left ureteral stent for ureteral stone and UTI - Leukocytosis now resolved.  Continue ceftriaxone pending urine culture. Pt would prefer to stay in the hospital today to ensure he is discharged on appropriate antibiotic therapy once culture is returned.  Considering his living situation, it also will benefit him to discharge him tomorrow to restart his home health nursing care per social work. - Will need to  discuss options for definitive stone treatment with Dr. Tresa Moore following resolution of infection.   LOS: 2 days   Patrick Orozco,LES 06/17/2015, 9:12 AM

## 2015-06-18 ENCOUNTER — Other Ambulatory Visit: Payer: Self-pay | Admitting: Urology

## 2015-06-18 ENCOUNTER — Encounter (HOSPITAL_COMMUNITY): Payer: Self-pay | Admitting: Urology

## 2015-06-18 MED ORDER — CEPHALEXIN 500 MG PO CAPS
500.0000 mg | ORAL_CAPSULE | Freq: Two times a day (BID) | ORAL | Status: DC
Start: 2015-06-18 — End: 2015-07-18

## 2015-06-18 NOTE — Discharge Instructions (Signed)
1 - You may have urinary urgency (bladder spasms) and bloody urine on / off with stent in place. This is normal. ° °2 - Call MD or go to ER for fever >102, severe pain / nausea / vomiting not relieved by medications, or acute change in medical status ° °

## 2015-06-18 NOTE — Care Management Note (Signed)
Case Management Note  Patient Details  Name: WASYL DORNFELD MRN: [id number] Date of Birth: [birth year]  Subjective/Objective:   59 y/o m admitted w/Ureteral stone. DP:OEUMP bifida. From home, alone.Received referral for home needs, per nsg-MD discontinued CM referral. No home needs identified.                   Action/Plan:d/c home. No needs or orders.   Expected Discharge Date:                  Expected Discharge Plan:  Home/Self Care  In-House Referral:     Discharge planning Services  CM Consult  Post Acute Care Choice:    Choice offered to:     DME Arranged:    DME Agency:     HH Arranged:    Painter Agency:     Status of Service:  Completed, signed off  Medicare Important Message Given:    Date Medicare IM Given:    Medicare IM give by:    Date Additional Medicare IM Given:    Additional Medicare Important Message give by:     If discussed at [place] of Stay Meetings, dates discussed:    Additional Comments:  Dessa Phi, RN 06/18/2015, 9:55 AM

## 2015-06-18 NOTE — Progress Notes (Signed)
Completed D/C teaching with patient. Answered all questions. Pt will D/C home with caregiver in stable condition.  Cathie Hoops, RN

## 2015-06-18 NOTE — Discharge Summary (Signed)
Physician Discharge Summary  Patient ID: Patrick Orozco MRN: [id number] DOB/AGE: 1956-01-18 59 y.o.  Admit date: 06/15/2015 Discharge date: 06/18/2015  Admission Diagnoses: Left Ureteral Stone with Pyelonephritis  Discharge Diagnoses:    Left Ureteral and Renal Stones,  Pyelonephritis, Partial Quadriplegia with Mild Sacral Decubiti  Discharged Condition: fair  Hospital Course:   1 - Left Ureteral and Renal Stones - s/p left ureteral stent 06/15/2015 for left mid ureteral stone (about 29m) with hydro, also ipsialteral lower pole aprox 2cm stoen burden.   2 -  Pyelonephritis / UTI - Pyuri,a bacteruria, leukocytosis (26K) at presentation c/w liekly left obstructed pyelonephritis. Treated with left ureteral stent and IV ceftriaxone. By 8/15, the day of dischage, he has cleared luekocytosis (<10k) and afebrile x 24 hours. Final UCX 8/12 non-clonal.   3 - Partial Quadriplegia with Mild Sacral Decubiti - pt with long h/o decubiti, is bed / [place] bound at baseline. Noted to have some early sacral skin breakdown w/o deep tissue exposure and managed with duoderm type dressing and encouraged shifting weight throught the day to avoid pressure spots. He has aide who helps with ADL's.   Consults: None  Significant Diagnostic Studies: labs: as per above  Treatments: surgery: left ureteral stent placement 8/12, IV ABX  Discharge Exam: Blood pressure 139/66, pulse 68, temperature 98.4 F (36.9 C), temperature source Oral, resp. rate 18, height '5\' 4"'$  (1.626 m), weight 122.789 kg (270 lb 11.2 oz), SpO2 98 %. General appearance: alert, cooperative and appears stated age Head: Normocephalic, without obvious abnormality, atraumatic Nose: Nares normal. Septum midline. Mucosa normal. No drainage or sinus tenderness. Throat: lips, mucosa, and tongue normal; teeth and gums normal Back: symmetric, no curvature. ROM normal. No CVA tenderness. Resp: non-labored on room air Cardio: Nl rate GI: soft, non-tender;  bowel sounds normal; no masses,  no organomegaly Male genitalia: normal, condom cath in place wtih very light pink urine.  Extremities: stable LE contractures and lateral hip scars.  Skin: stable sacral epidermal breakdown with duoderm in place. No bone / muscle exposed at all. No fluctuence.  Neurologic: Mental status: Alert, oriented, thought content appropriate, stable partila quadraplegia  Disposition: 01-Home or Self Care     Medication List    TAKE these medications        cephALEXin 500 MG capsule  Commonly known as:  KEFLEX  Take 1 capsule (500 mg total) by mouth 2 (two) times daily. X 5 days to finish treating urinary infection     Fluticasone-Salmeterol 250-50 MCG/DOSE Aepb  Commonly known as:  ADVAIR  Inhale 1 puff into the lungs 2 (two) times daily.     metoprolol succinate 50 MG 24 hr tablet  Commonly known as:  TOPROL-XL  Take 50 mg by mouth every morning.     simvastatin 20 MG tablet  Commonly known as:  ZOCOR  Take 20 mg by mouth daily.         Signed:Alexis Frock8/15/2016, 7:39 AM

## 2015-06-18 NOTE — Care Management Important Message (Signed)
Important Message  Patient Details  Name: Patrick Orozco MRN: [id number] Date of Birth: [birth year]   Medicare Important Message Given:  Yes-second notification given    Shelda Altes 06/18/2015, 3:24 PMImportant Message  Patient Details  Name: Patrick Orozco MRN: [id number] Date of Birth: 03/16/1956   Medicare Important Message Given:  Yes-second notification given    Shelda Altes 06/18/2015, 3:23 PM

## 2015-06-18 NOTE — Progress Notes (Signed)
Foul smelling wounds with large amount of whitish drainage.  Awaiting Wcoc.  Pt states his wife died 3 years ago and he lives alone with only an aide coming in to assist for about 2 hours a day, 5 days a week.  Pt would like to have RN come in to address wounds - reports no RN involved in case at this time.  Also awaiting social worker to come and help with living situation.

## 2015-07-10 NOTE — Patient Instructions (Signed)
YOUR PROCEDURE IS SCHEDULED ON :  07/18/15  REPORT TO [facility] MAIN ENTRANCE FOLLOW SIGNS TO EAST ELEVATOR - GO TO 3rd FLOOR CHECK IN AT 3 EAST NURSES STATION (SHORT STAY) AT:  10:45 AM  CALL THIS NUMBER IF YOU HAVE PROBLEMS THE MORNING OF SURGERY [phone]  REMEMBER:ONLY 1 PER PERSON MAY GO TO SHORT STAY WITH YOU TO GET READY THE MORNING OF YOUR SURGERY  DO NOT EAT FOOD OR DRINK LIQUIDS AFTER MIDNIGHT  TAKE THESE MEDICINES THE MORNING OF SURGERY:  YOU MAY NOT HAVE ANY METAL ON YOUR BODY INCLUDING HAIR PINS AND PIERCING'S. DO NOT WEAR JEWELRY, MAKEUP, LOTIONS, POWDERS OR PERFUMES. DO NOT WEAR NAIL POLISH. DO NOT SHAVE 48 HRS PRIOR TO SURGERY. MEN MAY SHAVE FACE AND NECK.  DO NOT [place]. [facility] IS NOT RESPONSIBLE FOR VALUABLES.  CONTACTS, DENTURES OR PARTIALS MAY NOT BE WORN TO SURGERY. LEAVE SUITCASE IN CAR. CAN BE BROUGHT TO ROOM AFTER SURGERY.  PATIENTS DISCHARGED THE DAY OF SURGERY WILL NOT BE ALLOWED TO DRIVE HOME.  PLEASE READ OVER THE FOLLOWING INSTRUCTION SHEETS _________________________________________________________________________________                                          [facility] - PREPARING FOR SURGERY  Before surgery, you can play an important role.  Because skin is not sterile, your skin needs to be as free of germs as possible.  You can reduce the number of germs on your skin by washing with CHG (chlorahexidine gluconate) soap before surgery.  CHG is an antiseptic cleaner which kills germs and bonds with the skin to continue killing germs even after washing. Please DO NOT use if you have an allergy to CHG or antibacterial soaps.  If your skin becomes reddened/irritated stop using the CHG and inform your nurse when you arrive at Short Stay. Do not shave (including legs and underarms) for at least 48 hours prior to the first CHG shower.  You may shave your face. Please follow these instructions  carefully:   1.  Shower with CHG Soap the night before surgery and the  morning of Surgery.   2.  If you choose to wash your hair, wash your hair first as usual with your  normal  Shampoo.   3.  After you shampoo, rinse your hair and body thoroughly to remove the  shampoo.                                         4.  Use CHG as you would any other liquid soap.  You can apply chg directly  to the skin and wash . Gently wash with scrungie or clean wascloth    5.  Apply the CHG Soap to your body ONLY FROM THE NECK DOWN.   Do not use on open                           Wound or open sores. Avoid contact with eyes, ears mouth and genitals (private parts).                        Genitals (private parts) with your normal soap.  6.  Wash thoroughly, paying special attention to the area where your surgery  will be performed.   7.  Thoroughly rinse your body with warm water from the neck down.   8.  DO NOT shower/wash with your normal soap after using and rinsing off  the CHG Soap .                9.  Pat yourself dry with a clean towel.             10.  Wear clean night clothes to bed after shower             11.  Place clean sheets on your bed the night of your first shower and do not  sleep with pets.  Day of Surgery : Do not apply any lotions/deodorants the morning of surgery.  Please wear clean clothes to the hospital/surgery center.  FAILURE TO FOLLOW THESE INSTRUCTIONS MAY RESULT IN THE CANCELLATION OF YOUR SURGERY    PATIENT SIGNATURE_________________________________  ______________________________________________________________________

## 2015-07-11 ENCOUNTER — Inpatient Hospital Stay (HOSPITAL_COMMUNITY)
Admission: RE | Admit: 2015-07-11 | Discharge: 2015-07-11 | Disposition: A | Discharge status: Home / Self Care | Payer: Medicare Other | Source: Ambulatory Visit

## 2015-07-17 ENCOUNTER — Encounter (HOSPITAL_COMMUNITY)
Admission: RE | Admit: 2015-07-17 | Discharge: 2015-07-17 | Disposition: A | Discharge status: Home / Self Care | Payer: Medicare Other | Source: Ambulatory Visit | Attending: Urology | Admitting: Urology

## 2015-07-17 ENCOUNTER — Other Ambulatory Visit: Payer: Self-pay | Admitting: Urology

## 2015-07-17 ENCOUNTER — Encounter (HOSPITAL_COMMUNITY): Payer: Self-pay

## 2015-07-17 HISTORY — DX: Sarcoidosis, unspecified: D86.9

## 2015-07-17 HISTORY — DX: Sleep apnea, unspecified: G47.30

## 2015-07-17 HISTORY — DX: Colostomy status: Z93.3

## 2015-07-17 HISTORY — DX: Neuromuscular dysfunction of bladder, unspecified: N31.9

## 2015-07-17 LAB — SURGICAL PCR SCREEN
MRSA, PCR: NEGATIVE
Staphylococcus aureus: POSITIVE — AB

## 2015-07-17 LAB — BASIC METABOLIC PANEL
Anion gap: 6 (ref 5–15)
BUN: 15 mg/dL (ref 6–20)
CALCIUM: 9.3 mg/dL (ref 8.9–10.3)
CO2: 26 mmol/L (ref 22–32)
CREATININE: 0.98 mg/dL (ref 0.61–1.24)
Chloride: 110 mmol/L (ref 101–111)
GFR calc Af Amer: >60 mL/min (ref >60)
GLUCOSE: 98 mg/dL (ref 65–99)
Potassium: 3.9 mmol/L (ref 3.5–5.1)
Sodium: 142 mmol/L (ref 135–145)

## 2015-07-17 LAB — CBC
HEMATOCRIT: 39 % (ref 39.0–52.0)
Hemoglobin: 12.9 g/dL — ABNORMAL LOW (ref 13.0–17.0)
MCH: 30.6 pg (ref 26.0–34.0)
MCHC: 33.1 g/dL (ref 30.0–36.0)
MCV: 92.4 fL (ref 78.0–100.0)
PLATELETS: 176 10*3/uL (ref 150–400)
RBC: 4.22 MIL/uL (ref 4.22–5.81)
RDW: 13.8 % (ref 11.5–15.5)
WBC: 5.7 10*3/uL (ref 4.0–10.5)

## 2015-07-17 NOTE — Progress Notes (Signed)
Your patient has screened at an elevated risk for Obstructive Sleep Apnea using the Stop-Bang Tool during a pre-surgical vist. Pt scored at high risk. Score of 5.Marland Kitchen

## 2015-07-17 NOTE — Progress Notes (Signed)
ECHO results per chart 01/10/2013 Stress test results per chart 01/10/2013 LOV note per PCP/Dr Ayesha Rumpf per chart 05/24/2015

## 2015-07-17 NOTE — Patient Instructions (Signed)
Kalai T Busk  07/17/2015   Your procedure is scheduled on: Wednesday July 18, 2015   Report to [facility] Main  Entrance take [place]  elevators to 3rd floor to  [place] at 10:45 AM.  Call this number if you have problems the morning of surgery [phone]   Remember: ONLY 1 PERSON MAY GO WITH YOU TO SHORT STAY TO GET  READY MORNING OF [place].  Do not eat food  After Midnight but may take clear liquids till 6:45 am day of surgery then nothing by mouth.      Take these medicines the morning of surgery with A SIP OF WATER: Metoprolol; May also use Advair Inhaler (bring with you day of surgery)                               You may not have any metal on your body including hair pins and              piercings  Do not wear jewelry,  lotions, powders or colognes, deodorant            Men may shave face and neck.   Do not bring valuables to the hospital. [place].  Contacts, dentures or bridgework may not be worn into surgery.  Leave suitcase in the car. After surgery it may be brought to your room.   _____________________________________________________________________             [facility] - Preparing for Surgery Before surgery, you can play an important role.  Because skin is not sterile, your skin needs to be as free of germs as possible.  You can reduce the number of germs on your skin by washing with CHG (chlorahexidine gluconate) soap before surgery.  CHG is an antiseptic cleaner which kills germs and bonds with the skin to continue killing germs even after washing. Please DO NOT use if you have an allergy to CHG or antibacterial soaps.  If your skin becomes reddened/irritated stop using the CHG and inform your nurse when you arrive at Short Stay. Do not shave (including legs and underarms) for at least 48 hours prior to the first CHG shower.  You may shave your face/neck. Please follow  these instructions carefully:  1.  Shower with CHG Soap the night before surgery and the  morning of Surgery.  2.  If you choose to wash your hair, wash your hair first as usual with your  normal  shampoo.  3.  After you shampoo, rinse your hair and body thoroughly to remove the  shampoo.                           4.  Use CHG as you would any other liquid soap.  You can apply chg directly  to the skin and wash                       Gently with a scrungie or clean washcloth.  5.  Apply the CHG Soap to your body ONLY FROM THE NECK DOWN.   Do not use on face/ open  Wound or open sores. Avoid contact with eyes, ears mouth and genitals (private parts).                       Wash face,  Genitals (private parts) with your normal soap.             6.  Wash thoroughly, paying special attention to the area where your surgery  will be performed.  7.  Thoroughly rinse your body with warm water from the neck down.  8.  DO NOT shower/wash with your normal soap after using and rinsing off  the CHG Soap.                9.  Pat yourself dry with a clean towel.            10.  Wear clean pajamas.            11.  Place clean sheets on your bed the night of your first shower and do not  sleep with pets. Day of Surgery : Do not apply any lotions/deodorants the morning of surgery.  Please wear clean clothes to the hospital/surgery center.  FAILURE TO FOLLOW THESE INSTRUCTIONS MAY RESULT IN THE CANCELLATION OF YOUR SURGERY PATIENT SIGNATURE_________________________________  NURSE SIGNATURE__________________________________  ________________________________________________________________________    CLEAR LIQUID DIET   Foods Allowed                                                                     Foods Excluded  Coffee and tea, regular and decaf                             liquids that you cannot  Plain Jell-O in any flavor                                             see through  such as: Fruit ices (not with fruit pulp)                                     milk, soups, orange juice  Iced Popsicles                                    All solid food Carbonated beverages, regular and diet                                    Cranberry, grape and apple juices Sports drinks like Gatorade Lightly seasoned clear broth or consume(fat free) Sugar, honey syrup  Sample Menu Breakfast                                Lunch  Supper Cranberry juice                    Beef broth                            Chicken broth Jell-O                                     Grape juice                           Apple juice Coffee or tea                        Jell-O                                      Popsicle                                                Coffee or tea                        Coffee or tea  _____________________________________________________________________

## 2015-07-17 NOTE — Progress Notes (Addendum)
Released order for surgical consent for 1st stage of surgical procedure scheduled for 07/18/2015 during PAT visit 07/17/2015 but placed called to [facility] with [facility] in regards to need for clarification due to pt being  paraesis of both legs (as noted per H&P per PCP Dr Ayesha Rumpf 05/24/2015 - on pts chart) instead of quadriplegic as noted per surgical consent. Will need new order placed for 1st stage of surgical consent scheduled for 07/18/2015 and pt will need to sign consent day of surgery.

## 2015-07-18 ENCOUNTER — Encounter (HOSPITAL_COMMUNITY): Payer: Self-pay | Admitting: *Deleted

## 2015-07-18 ENCOUNTER — Encounter (HOSPITAL_COMMUNITY)
Admission: RE | Disposition: A | Discharge status: Home / Self Care | Payer: Self-pay | Source: Ambulatory Visit | Attending: Urology

## 2015-07-18 ENCOUNTER — Inpatient Hospital Stay (HOSPITAL_COMMUNITY): Payer: Medicare Other | Admitting: Certified Registered"

## 2015-07-18 ENCOUNTER — Inpatient Hospital Stay (HOSPITAL_COMMUNITY)
Admission: RE | Admit: 2015-07-18 | Discharge: 2015-07-21 | DRG: 659 | Disposition: A | Discharge status: Home / Self Care | Payer: Medicare Other | Source: Ambulatory Visit | Attending: Urology | Admitting: Urology

## 2015-07-18 DIAGNOSIS — Z89429 Acquired absence of other toe(s), unspecified side: Secondary | ICD-10-CM | POA: Diagnosis not present

## 2015-07-18 DIAGNOSIS — Z87440 Personal history of urinary (tract) infections: Secondary | ICD-10-CM | POA: Diagnosis not present

## 2015-07-18 DIAGNOSIS — K219 Gastro-esophageal reflux disease without esophagitis: Secondary | ICD-10-CM | POA: Diagnosis present

## 2015-07-18 DIAGNOSIS — Z87891 Personal history of nicotine dependence: Secondary | ICD-10-CM | POA: Diagnosis not present

## 2015-07-18 DIAGNOSIS — L89159 Pressure ulcer of sacral region, unspecified stage: Secondary | ICD-10-CM | POA: Diagnosis not present

## 2015-07-18 DIAGNOSIS — Z933 Colostomy status: Secondary | ICD-10-CM | POA: Diagnosis not present

## 2015-07-18 DIAGNOSIS — I10 Essential (primary) hypertension: Secondary | ICD-10-CM | POA: Diagnosis present

## 2015-07-18 DIAGNOSIS — G8250 Quadriplegia, unspecified: Secondary | ICD-10-CM | POA: Diagnosis present

## 2015-07-18 DIAGNOSIS — N319 Neuromuscular dysfunction of bladder, unspecified: Secondary | ICD-10-CM | POA: Diagnosis present

## 2015-07-18 DIAGNOSIS — L8990 Pressure ulcer of unspecified site, unspecified stage: Secondary | ICD-10-CM | POA: Insufficient documentation

## 2015-07-18 DIAGNOSIS — Q059 Spina bifida, unspecified: Secondary | ICD-10-CM | POA: Diagnosis not present

## 2015-07-18 DIAGNOSIS — G4730 Sleep apnea, unspecified: Secondary | ICD-10-CM | POA: Diagnosis present

## 2015-07-18 DIAGNOSIS — Z880 Allergy status to penicillin: Secondary | ICD-10-CM

## 2015-07-18 DIAGNOSIS — Z6841 Body Mass Index (BMI) 40.0 and over, adult: Secondary | ICD-10-CM | POA: Diagnosis not present

## 2015-07-18 DIAGNOSIS — L89329 Pressure ulcer of left buttock, unspecified stage: Secondary | ICD-10-CM | POA: Diagnosis present

## 2015-07-18 DIAGNOSIS — Z01810 Encounter for preprocedural cardiovascular examination: Secondary | ICD-10-CM | POA: Diagnosis not present

## 2015-07-18 DIAGNOSIS — N202 Calculus of kidney with calculus of ureter: Principal | ICD-10-CM | POA: Diagnosis present

## 2015-07-18 DIAGNOSIS — E785 Hyperlipidemia, unspecified: Secondary | ICD-10-CM | POA: Diagnosis present

## 2015-07-18 DIAGNOSIS — Z01812 Encounter for preprocedural laboratory examination: Secondary | ICD-10-CM

## 2015-07-18 DIAGNOSIS — N200 Calculus of kidney: Secondary | ICD-10-CM

## 2015-07-18 DIAGNOSIS — F329 Major depressive disorder, single episode, unspecified: Secondary | ICD-10-CM | POA: Diagnosis present

## 2015-07-18 DIAGNOSIS — D869 Sarcoidosis, unspecified: Secondary | ICD-10-CM | POA: Diagnosis present

## 2015-07-18 DIAGNOSIS — Z801 Family history of malignant neoplasm of trachea, bronchus and lung: Secondary | ICD-10-CM

## 2015-07-18 DIAGNOSIS — Z87442 Personal history of urinary calculi: Secondary | ICD-10-CM

## 2015-07-18 HISTORY — PX: CYSTOSCOPY/URETEROSCOPY/HOLMIUM LASER/STENT PLACEMENT: SHX6546

## 2015-07-18 SURGERY — CYSTOSCOPY/URETEROSCOPY/HOLMIUM LASER/STENT PLACEMENT
Anesthesia: General | Laterality: Left

## 2015-07-18 MED ORDER — MOMETASONE FURO-FORMOTEROL FUM 100-5 MCG/ACT IN AERO
2.0000 | INHALATION_SPRAY | Freq: Two times a day (BID) | RESPIRATORY_TRACT | Status: DC
  Administered 2015-07-18 – 2015-07-20 (×4): 2 via RESPIRATORY_TRACT
  Filled 2015-07-18: qty 8.8

## 2015-07-18 MED ORDER — MUPIROCIN 2 % EX OINT
TOPICAL_OINTMENT | CUTANEOUS | Status: AC
  Administered 2015-07-18: 1
  Filled 2015-07-18: qty 22

## 2015-07-18 MED ORDER — METOPROLOL SUCCINATE ER 50 MG PO TB24
50.0000 mg | ORAL_TABLET | Freq: Every day | ORAL | Status: DC
  Administered 2015-07-20 – 2015-07-21 (×2): 50 mg via ORAL
  Filled 2015-07-18 (×2): qty 1

## 2015-07-18 MED ORDER — POTASSIUM CHLORIDE IN NACL 20-0.9 MEQ/L-% IV SOLN
INTRAVENOUS | Status: DC
Start: 2015-07-18 — End: 2015-07-21
  Administered 2015-07-18 – 2015-07-20 (×5): via INTRAVENOUS
  Filled 2015-07-18 (×6): qty 1000

## 2015-07-18 MED ORDER — ONDANSETRON HCL 4 MG/2ML IJ SOLN
4.0000 mg | INTRAMUSCULAR | Status: DC | PRN
  Administered 2015-07-19 – 2015-07-20 (×2): 4 mg via INTRAVENOUS
  Filled 2015-07-18: qty 2

## 2015-07-18 MED ORDER — LIDOCAINE HCL (PF) 2 % IJ SOLN
INTRAMUSCULAR | Status: DC | PRN
  Administered 2015-07-18: 50 mg via INTRADERMAL

## 2015-07-18 MED ORDER — SENNOSIDES-DOCUSATE SODIUM 8.6-50 MG PO TABS
1.0000 | ORAL_TABLET | Freq: Two times a day (BID) | ORAL | Status: DC
  Administered 2015-07-18 – 2015-07-21 (×6): 1 via ORAL
  Filled 2015-07-18 (×6): qty 1

## 2015-07-18 MED ORDER — LIDOCAINE HCL (CARDIAC) 20 MG/ML IV SOLN
INTRAVENOUS | Status: AC
  Filled 2015-07-18: qty 5

## 2015-07-18 MED ORDER — PROPOFOL 10 MG/ML IV BOLUS
INTRAVENOUS | Status: AC
  Filled 2015-07-18: qty 20

## 2015-07-18 MED ORDER — FENTANYL CITRATE (PF) 100 MCG/2ML IJ SOLN
INTRAMUSCULAR | Status: AC
  Filled 2015-07-18: qty 4

## 2015-07-18 MED ORDER — FENTANYL CITRATE (PF) 100 MCG/2ML IJ SOLN
INTRAMUSCULAR | Status: DC | PRN
  Administered 2015-07-18 (×2): 50 ug via INTRAVENOUS

## 2015-07-18 MED ORDER — MEPERIDINE HCL 50 MG/ML IJ SOLN: 6.2500 mg | INTRAMUSCULAR | Status: DC | PRN

## 2015-07-18 MED ORDER — FENTANYL CITRATE (PF) 100 MCG/2ML IJ SOLN: 25.0000 ug | INTRAMUSCULAR | Status: DC | PRN

## 2015-07-18 MED ORDER — IOHEXOL 300 MG/ML  SOLN
INTRAMUSCULAR | Status: DC | PRN
  Administered 2015-07-18: 20 mL via URETHRAL

## 2015-07-18 MED ORDER — MIDAZOLAM HCL 5 MG/5ML IJ SOLN
INTRAMUSCULAR | Status: DC | PRN
  Administered 2015-07-18: 2 mg via INTRAVENOUS

## 2015-07-18 MED ORDER — HYDROMORPHONE HCL 1 MG/ML IJ SOLN: 0.5000 mg | INTRAMUSCULAR | Status: DC | PRN

## 2015-07-18 MED ORDER — SUCCINYLCHOLINE CHLORIDE 20 MG/ML IJ SOLN
INTRAMUSCULAR | Status: DC | PRN
  Administered 2015-07-18: 100 mg via INTRAVENOUS

## 2015-07-18 MED ORDER — SODIUM CHLORIDE 0.9 % IR SOLN
Status: DC | PRN
  Administered 2015-07-18: 8000 mL

## 2015-07-18 MED ORDER — OXYCODONE HCL 5 MG PO TABS
5.0000 mg | ORAL_TABLET | ORAL | Status: DC | PRN
  Administered 2015-07-19: 5 mg via ORAL
  Filled 2015-07-18: qty 1

## 2015-07-18 MED ORDER — DEXTROSE 5 % IV SOLN
1.0000 g | INTRAVENOUS | Status: DC
  Administered 2015-07-18 – 2015-07-20 (×3): 1 g via INTRAVENOUS
  Filled 2015-07-18 (×3): qty 10

## 2015-07-18 MED ORDER — GENTAMICIN SULFATE 40 MG/ML IJ SOLN
400.0000 mg | INTRAVENOUS | Status: AC
  Administered 2015-07-18: 400 mg via INTRAVENOUS
  Filled 2015-07-18: qty 10

## 2015-07-18 MED ORDER — MUPIROCIN 2 % EX OINT
1.0000 "application " | TOPICAL_OINTMENT | Freq: Once | CUTANEOUS | Status: AC
  Administered 2015-07-18: 1 via TOPICAL

## 2015-07-18 MED ORDER — ONDANSETRON HCL 4 MG/2ML IJ SOLN
INTRAMUSCULAR | Status: DC | PRN
  Administered 2015-07-18: 4 mg via INTRAVENOUS

## 2015-07-18 MED ORDER — EPHEDRINE SULFATE 50 MG/ML IJ SOLN
INTRAMUSCULAR | Status: DC | PRN
  Administered 2015-07-18 (×2): 10 mg via INTRAVENOUS

## 2015-07-18 MED ORDER — ONDANSETRON HCL 4 MG/2ML IJ SOLN
INTRAMUSCULAR | Status: AC
  Filled 2015-07-18: qty 2

## 2015-07-18 MED ORDER — SIMVASTATIN 20 MG PO TABS
20.0000 mg | ORAL_TABLET | Freq: Every day | ORAL | Status: DC
  Administered 2015-07-19 – 2015-07-20 (×2): 20 mg via ORAL
  Filled 2015-07-18 (×2): qty 1

## 2015-07-18 MED ORDER — LACTATED RINGERS IV SOLN: INTRAVENOUS | Status: DC

## 2015-07-18 MED ORDER — GENTAMICIN IN SALINE 1.6-0.9 MG/ML-% IV SOLN: 80.0000 mg | INTRAVENOUS | Status: DC

## 2015-07-18 MED ORDER — PROMETHAZINE HCL 25 MG/ML IJ SOLN: 6.2500 mg | INTRAMUSCULAR | Status: DC | PRN

## 2015-07-18 MED ORDER — MIDAZOLAM HCL 2 MG/2ML IJ SOLN
INTRAMUSCULAR | Status: AC
  Filled 2015-07-18: qty 4

## 2015-07-18 MED ORDER — PROPOFOL 10 MG/ML IV BOLUS
INTRAVENOUS | Status: DC | PRN
  Administered 2015-07-18: 140 mg via INTRAVENOUS

## 2015-07-18 MED ORDER — GENTAMICIN SULFATE 40 MG/ML IJ SOLN
400.0000 mg | INTRAVENOUS | Status: AC
  Administered 2015-07-20: 400 mg via INTRAVENOUS
  Filled 2015-07-18 (×2): qty 10

## 2015-07-18 MED ORDER — ACETAMINOPHEN 500 MG PO TABS
1000.0000 mg | ORAL_TABLET | Freq: Three times a day (TID) | ORAL | Status: AC
  Administered 2015-07-18 – 2015-07-19 (×3): 1000 mg via ORAL
  Filled 2015-07-18 (×3): qty 2

## 2015-07-18 MED ORDER — LACTATED RINGERS IV SOLN
INTRAVENOUS | Status: AC
  Administered 2015-07-18: 1000 mL via INTRAVENOUS

## 2015-07-18 SURGICAL SUPPLY — 25 items
BAG URINE LEG 500ML (DRAIN) ×3 IMPLANT
BASKET LASER NITINOL 1.9FR (BASKET) ×6 IMPLANT
BASKET STNLS GEMINI 4WIRE 3FR (BASKET) IMPLANT
BASKET ZERO TIP NITINOL 2.4FR (BASKET) IMPLANT
CATH INTERMIT  6FR 70CM (CATHETERS) ×3 IMPLANT
CLOTH BEACON ORANGE TIMEOUT ST (SAFETY) ×3 IMPLANT
DRESSING DUODERM 4X4 STERILE (GAUZE/BANDAGES/DRESSINGS) ×3 IMPLANT
ELECT REM PT RETURN 9FT ADLT (ELECTROSURGICAL)
ELECTRODE REM PT RTRN 9FT ADLT (ELECTROSURGICAL) IMPLANT
FIBER LASER FLEXIVA 1000 (UROLOGICAL SUPPLIES) IMPLANT
FIBER LASER FLEXIVA 200 (UROLOGICAL SUPPLIES) IMPLANT
FIBER LASER FLEXIVA 365 (UROLOGICAL SUPPLIES) ×3 IMPLANT
FIBER LASER FLEXIVA 550 (UROLOGICAL SUPPLIES) IMPLANT
FIBER LASER TRAC TIP (UROLOGICAL SUPPLIES) ×3 IMPLANT
GLOVE BIOGEL M STRL SZ7.5 (GLOVE) ×3 IMPLANT
GOWN STRL REUS W/TWL XL LVL3 (GOWN DISPOSABLE) ×6 IMPLANT
GUIDEWIRE ANG ZIPWIRE 038X150 (WIRE) ×3 IMPLANT
GUIDEWIRE STR DUAL SENSOR (WIRE) ×3 IMPLANT
IV NS IRRIG 3000ML ARTHROMATIC (IV SOLUTION) IMPLANT
PACK CYSTO (CUSTOM PROCEDURE TRAY) ×3 IMPLANT
SHEATH ACCESS URETERAL 38CM (SHEATH) ×3 IMPLANT
STENT URET 6FRX24 CONTOUR (STENTS) ×3 IMPLANT
SYRINGE 10CC LL (SYRINGE) IMPLANT
SYRINGE IRR TOOMEY STRL 70CC (SYRINGE) IMPLANT
TUBE FEEDING 8FR 16IN STR KANG (MISCELLANEOUS) ×3 IMPLANT

## 2015-07-18 NOTE — Transfer of Care (Signed)
Immediate Anesthesia Transfer of Care Note  Patient: Patrick Orozco  Procedure(s) Performed: Procedure(s): 1ST STAGE CYSTOSCOPY/URETEROSCOPY/HOLMIUM LASER/STENT REPLACEMENT (Left)  Patient Location: PACU  Anesthesia Type:General  Level of Consciousness:  sedated, patient cooperative and responds to stimulation  Airway & Oxygen Therapy:Patient Spontanous Breathing and Patient connected to face mask oxgen  Post-op Assessment:  Report given to PACU RN and Post -op Vital signs reviewed and stable  Post vital signs:  Reviewed and stable  Last Vitals:  Filed Vitals:   07/18/15 1547  BP: 128/81  Pulse: 71  Temp: 36.3 C  Resp: 6    Complications: No apparent anesthesia complications

## 2015-07-18 NOTE — Anesthesia Procedure Notes (Signed)
Procedure Name: Intubation Date/Time: 07/18/2015 1:07 PM Performed by: Lajuana Carry E Pre-anesthesia Checklist: Patient identified, Emergency Drugs available, Suction available and Patient being monitored Patient Re-evaluated:Patient Re-evaluated prior to inductionOxygen Delivery Method: Circle System Utilized Preoxygenation: Pre-oxygenation with 100% oxygen Intubation Type: IV induction Ventilation: Mask ventilation without difficulty Laryngoscope Size: Mac and 3 Grade View: Grade I Tube type: Oral Tube size: 7.5 mm Number of attempts: 2 Airway Equipment and Method: Stylet Placement Confirmation: ETT inserted through vocal cords under direct vision,  positive ETCO2 and breath sounds checked- equal and bilateral Secured at: 21 cm Tube secured with: Tape Dental Injury: Teeth and Oropharynx as per pre-operative assessment and Injury to tongue  Comments: Front chipped tooth with no change

## 2015-07-18 NOTE — Brief Op Note (Signed)
07/18/2015  3:27 PM  PATIENT:  Patrick Orozco  59 y.o. male  PRE-OPERATIVE DIAGNOSIS:  LEFT RENAL STONES, QUADRIPLEGIA  POST-OPERATIVE DIAGNOSIS:  LEFT RENAL STONES, QUADRIPLEGIA  PROCEDURE:  Procedure(s): 1ST STAGE CYSTOSCOPY/URETEROSCOPY/HOLMIUM LASER/STENT REPLACEMENT (Left)  SURGEON:  Surgeon(s) and Role:    * Alexis Frock, MD - Primary  PHYSICIAN ASSISTANT:   ASSISTANTS: none   ANESTHESIA:   general  EBL:     BLOOD ADMINISTERED:none  DRAINS: condom urinary catheter to gravity   LOCAL MEDICATIONS USED:  NONE  SPECIMEN:  Source of Specimen:  left renal and ureteral stone fragments  DISPOSITION OF SPECIMEN:  [facility] for compositional analysis  COUNTS:  YES  TOURNIQUET:  * No tourniquets in log *  DICTATION: .Other Dictation: Dictation Number [id number]  PLAN OF CARE: Admit to inpatient   PATIENT DISPOSITION:  PACU - hemodynamically stable.   Delay start of Pharmacological VTE agent (>24hrs) due to surgical blood loss or risk of bleeding: yes

## 2015-07-18 NOTE — Progress Notes (Signed)
Released order for surgical consent for 1st stage of surgical procedure scheduled for 07/18/2015 during PAT visit 07/17/2015 but placed called to [facility] with [facility] in regards to need for clarification due to pt being  paraesis of both legs (as noted per H&P per PCP Dr Ayesha Rumpf 05/24/2015 - on pts chart) instead of quadriplegic as noted per surgical consent. Will need new order placed for 1st stage of surgical consent scheduled for 07/18/2015 and pt will need to sign consent day of surgery.

## 2015-07-18 NOTE — Progress Notes (Signed)
Released order for surgical consent for 1st stage of surgical procedure scheduled for 07/18/2015 during PAT visit 07/17/2015 but placed called to [facility] with [facility] in regards to need for clarification due to pt being  paraesis of both legs (as noted per H&P per PCP Dr Ayesha Rumpf 05/24/2015 - on pts chart) instead of quadriplegic as noted per surgical consent. Will need new order placed for 1st stage of surgical consent scheduled for 07/18/2015 and pt will need to sign consent day of surgery.

## 2015-07-18 NOTE — H&P (Signed)
Patrick Orozco is an 59 y.o. male.    Chief Complaint: Pre-OP First stage Left Ureteroscopic Stone Manipulation  HPI:   1 - Neurogenic Bladder / Spina Bifida - Pt manages bladder with spontaneous void into condom cath x many decades Cr 2014 0.96. No prior UDS. Random bladder scan 03/2013 "162m". No retention episodes.  2 - Nephrolithiasis - Pt wtih left 1.8cm lower pole and 9328mintrarenal stones by CT 01/2013 during hospitalization for pyelonephritis.   09/2013 - CT 5% interval size increase approx compared to prior.  09/2014 - CT sable size 06/2015 - Left ureteral stent for 28m76mistal ureteral stone and 2cm LLP partial staghorn stone at time of UTI + obstruction  3 - Recurrent UTI - Pt with relaitvely infrequent urinary infection. [facility]. [facility] (sens keflex, bactrim, rocephin, gent, res fluoroquinolones). Most recent [id number] e. Coli sens bactrim, rocephin, gent, keflex.   PMH sig for spina bifida / back surgeries (lives on own, independent), HTN, HLD, Rt lung mass (follows pulm med). 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 He has been on Bactrim pre-procedure to reduce bacterial load from likely colonized stone.   Past Medical History  Diagnosis Date  . Spina bifida   . Hypertension   . Hyperlipidemia   . Kidney stones   . UTI (lower urinary tract infection)   . Depression     wife passed July 2013  . Shortness of breath     occassionally  . GERD (gastroesophageal reflux disease)   . Kidney stones   . Colostomy in place   . Neurogenic bladder     uses condom cath  . Sarcoidosis   . Sleep apnea     pt denies sleep apnea pt states never followed through with sleep study; pt scored 5 per stop bang tool per PAT visit 07/17/2015;  results sent to PCP Dr ReeAyesha Rumpf Past Surgical History  Procedure Laterality Date  . Spine surgery      multiple surgeries related to spina bifida  . Colostomy    . 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 Endobronchial ultrasound Bilateral 04/25/2013    Procedure: ENDOBRONCHIAL ULTRASOUND;  Surgeon: RobCollene GobbleD;  Location: [facility] ENDOSCOPY;  Service: Cardiopulmonary;  Laterality: Bilateral;  . Cystoscopy w/ ureteral stent placement Left 06/15/2015    Procedure: CYSTOSCOPY WITH STENT REPLACEMENT;  Surgeon: LesRaynelle BringD;  Location: [facility] ORS;  Service: Urology;  Laterality: Left;    Family History  Problem Relation Age of Onset  . Cancer Mother     ?lung  . Cancer Maternal Grandfather     lung   Social History:  reports that he quit smoking about 22 years ago. His smoking use included Cigarettes. He has a 5.5 pack-year smoking history. He has never used smokeless tobacco. He reports that he does not drink alcohol or use illicit drugs.  Allergies:  Allergies  Allergen Reactions  . Ivp Dye [Iodinated Diagnostic Agents] Itching    No prescriptions prior to admission    Results for orders placed or performed during the hospital encounter of 07/17/15 (from the past 48 hour(s))  Surgical pcr screen     Status: Abnormal   Collection Time: 07/17/15  2:00 PM  Result Value Ref Range   MRSA, PCR NEGATIVE NEGATIVE   Staphylococcus aureus POSITIVE (A) NEGATIVE    Comment:        The Xpert SA Assay (FDA approved for NASAL specimens in patients over 30 years of age), is one component of a comprehensive surveillance program.  Test performance has been validated by [facility] for patients greater than or equal to 45 year old. It is not intended to diagnose infection nor to guide or monitor treatment.   CBC     Status: Abnormal   Collection Time: 07/17/15  2:40 PM  Result Value Ref Range   WBC 5.7 4.0 - 10.5 K/uL   RBC 4.22 4.22 - 5.81 MIL/uL   Hemoglobin 12.9 (L) 13.0 -  17.0 g/dL   HCT 39.0 39.0 - 52.0 %   MCV 92.4 78.0 - 100.0 fL   MCH 30.6 26.0 - 34.0 pg   MCHC 33.1 30.0 - 36.0 g/dL   RDW 13.8 11.5 - 15.5 %   Platelets 176 150 - 400 K/uL  Basic metabolic panel     Status: None   Collection Time: 07/17/15  2:40 PM  Result Value Ref Range   Sodium 142 135 - 145 mmol/L   Potassium 3.9 3.5 - 5.1 mmol/L   Chloride 110 101 - 111 mmol/L   CO2 26 22 - 32 mmol/L   Glucose, Bld 98 65 - 99 mg/dL   BUN 15 6 - 20 mg/dL   Creatinine, Ser 0.98 0.61 - 1.24 mg/dL   Calcium 9.3 8.9 - 10.3 mg/dL   GFR calc non Af Amer >60 >60 mL/min   GFR calc Af Amer >60 >60 mL/min    Comment: (NOTE) The eGFR has been calculated using the CKD EPI equation. This calculation has not been validated in all clinical situations. eGFR's persistently <60 mL/min signify possible Chronic Kidney Disease.    Anion gap 6 5 - 15   No results found.  Review of Systems  Constitutional: Negative.  Negative for fever and chills.  HENT: Negative.   Eyes: Negative.   Respiratory: Negative.   Cardiovascular: Negative.   Gastrointestinal: Negative.  Negative for nausea and vomiting.  Genitourinary: Negative for hematuria.  Musculoskeletal: Negative.   Skin: Negative.   Neurological: Negative.   Endo/Heme/Allergies: Negative.   Psychiatric/Behavioral: Negative.     There were no vitals taken for this visit. Physical Exam  Constitutional: He appears well-developed.  Stigmata of spina bifida  Eyes: Pupils are equal, round, and reactive to light.  Neck: Normal range of motion.  Cardiovascular: Normal rate.   Respiratory: Effort normal.  GI: Soft.  Significant truncal obesity  Genitourinary: Penis normal.  No CVAT. Wears condom cath.   Musculoskeletal:  In motor chair, multiple scars on back  Neurological: He is alert.  Skin: Skin is warm.  Psychiatric: He has a normal mood and affect. His behavior is normal. Judgment and thought content normal.     Assessment/Plan    1 -  Neurogenic Bladder / Spina Bifida - partial injury. Continue current regimen. No new hydro by imaging.  2 - Nephrolithiasis - Large volume left sided stone including ureteral stone. Will proceed as planned with staged ureteroscopy. I frankly indiated he is at at least 5X increase risk of ALL complications given his baseline functional status. Risks, benefits, alternatives discussed in detail.  FIrst stage today, SEcond stage planned for Friday.  3 - Recurrent UTI - Left sided stones and neurogenic bladder likley contributory. I am  optimistic that getting him stone free will help reduce nidus. Will keep on ABX in house between surgical stages.     Hydia Copelin 07/18/2015, 6:43 AM

## 2015-07-18 NOTE — Anesthesia Preprocedure Evaluation (Signed)
Anesthesia Evaluation  Patient identified by MRN, date of birth, ID band Patient awake    Reviewed: Allergy & Precautions, NPO status , Patient's Chart, lab work & pertinent test results  Airway Mallampati: I  TM Distance: >3 FB Neck ROM: Full    Dental   Pulmonary sleep apnea , former smoker,    Pulmonary exam normal        Cardiovascular hypertension, Pt. on medications Normal cardiovascular exam     Neuro/Psych Depression H/O Spina Bifida negative neurological ROS     GI/Hepatic Neg liver ROS, GERD  Medicated and Controlled,  Endo/Other  Morbid obesity  Renal/GU CRFRenal disease  negative genitourinary   Musculoskeletal negative musculoskeletal ROS (+)   Abdominal   Peds negative pediatric ROS (+)  Hematology negative hematology ROS (+)   Anesthesia Other Findings   Reproductive/Obstetrics negative OB ROS                             Anesthesia Physical  Anesthesia Plan  ASA: III and emergent  Anesthesia Plan: General   Post-op Pain Management:    Induction: Intravenous  Airway Management Planned: Oral ETT  Additional Equipment:   Intra-op Plan:   Post-operative Plan: Extubation in OR  Informed Consent: I have reviewed the patients History and Physical, chart, labs and discussed the procedure including the risks, benefits and alternatives for the proposed anesthesia with the patient or authorized representative who has indicated his/her understanding and acceptance.     Plan Discussed with: CRNA and Surgeon  Anesthesia Plan Comments:         Anesthesia Quick Evaluation

## 2015-07-19 ENCOUNTER — Encounter (HOSPITAL_COMMUNITY): Payer: Self-pay | Admitting: Urology

## 2015-07-19 DIAGNOSIS — L8990 Pressure ulcer of unspecified site, unspecified stage: Secondary | ICD-10-CM | POA: Insufficient documentation

## 2015-07-19 LAB — BASIC METABOLIC PANEL
ANION GAP: 5 (ref 5–15)
BUN: 13 mg/dL (ref 6–20)
CALCIUM: 8.5 mg/dL — AB (ref 8.9–10.3)
CO2: 24 mmol/L (ref 22–32)
Chloride: 110 mmol/L (ref 101–111)
Creatinine, Ser: 1.18 mg/dL (ref 0.61–1.24)
GFR calc Af Amer: >60 mL/min (ref >60)
GLUCOSE: 105 mg/dL — AB (ref 65–99)
Potassium: 4.2 mmol/L (ref 3.5–5.1)
SODIUM: 139 mmol/L (ref 135–145)

## 2015-07-19 LAB — GLUCOSE, CAPILLARY: GLUCOSE-CAPILLARY: 80 mg/dL (ref 65–99)

## 2015-07-19 NOTE — Consult Note (Signed)
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  Minimally raised.  Current pouching system is too large and aperture is cut too large for stoma, leaving parastomal skin exposed.   Peristomal assessment: Area of MARSI (medical adhesive related skin injury (tension blisters) at eh medial edge of the pouching system measuring 8cm x 1.5cm.  Half of blistered area is intact and serum-filled, 50% is ruptured blister revealing a pink/red moist wound bed Treatment options for stomal/peristomal skin: Covered with thin film transparent dressing Output None Ostomy pouching: 1pc. pouich (#725) with a skin barrier ring #[id number].  Education provided: None Enrolled patient in [place] program: No  WOC wound consult note Reason for Consult:Chronic pressure injuries, left hip, left heel, perineum and gluteal cleft (Moisture associated skin damage) Wound type:pressure adn moisture Pressure Ulcer POA: Yes/No Measurement: Left IT:  1.5cm round x 0.2cm with macerated periwound from hydrocolloid dressing. Pale pink wound bed, no granulation.  Perineal wound:  4cm x 2cm x 0.2cm with pale pink wound bed, no granulation tissue.  Moderate amount of serous to light yellow exudate. Intragluteal cleft: Intertriginous dermatitis (moisture is etiology): 2cm x 0.5cm x 0.2cm with pink, moist wound bed, scant serous exudate Wound bed: As described above. Drainage (amount, consistency, odor) As described above. Periwound:As described above and otherwise normal, dry and intact Dressing procedure/placement/frequency: I will provide our house moisture management dressing for the intergluteal cleft moisture wound and have provided Nursing with orders for the left ischial tuberosity and perineal wounds with a silver hydrofiber to manage surface bacteria.  Patient spends most of his time sitting in bed (IT and  perineal wounds) and is educated to correct positioning for resolution.  He will continue his follow up with the outpatient wound care center at [facility] post discharge. [facility] nursing team will not follow, but will remain available to this patient, the nursing and medical teams.  Please re-consult if needed. Thanks, Maudie Flakes, MSN, RN, [place], [place], [place] [phone])

## 2015-07-19 NOTE — Progress Notes (Signed)
1 Day Post-Op  Subjective:  1 - Nephrolithiasis - s/p first stage left ureteroscopic stone manipulation 9/14 (day of admission) for 2cm left lower pole partial staghorn and 46m ureteral stone. Had left stent piror to admission.   2 - Recurrent UTI - Pt with recurrent UTI including episode pyelo 2016. Most recent [place]9/2016 e. Coli sens bactrim, rocephin, gent, keflex. Had been on Bactrim prior to this admission, and rocephin in house as his stone is likely colonized.  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 Wounds examined intra-op 9/14 and clean-based, duoerm applied. He has colostomy which prevent fecal contamination.   Today "Patrick Orozco" is feeling good. No interval fevers / flank pain.   Objective: Vital signs in last 24 hours: Temp:  [97.4 F (36.3 C)-99.4 F (37.4 C)] 98.3 F (36.8 C) (09/15 0535) Pulse Rate:  [65-89] 89 (09/15 0535) Resp:  [6-22] 20 (09/15 0535) BP: (91-134)/(46-81) 91/46 mmHg (09/15 0535) SpO2:  [97 %-100 %] 99 % (09/15 0535) Weight:  [121.11 kg (267 lb)] 121.11 kg (267 lb) (09/14 1023)    Intake/Output from previous day: 09/14 0701 - 09/15 0700 In: 1317.5 [I.V.:1207.5; IV Piggyback:110] Out: 450  Intake/Output this shift:    General appearance: alert, cooperative and appears older than stated age Eyes: conjunctivae/corneas clear. PERRL, EOM's intact. Fundi benign., stable baseline disconjugate gaze Nose: Nares normal. Septum midline. Mucosa normal. No drainage or sinus tenderness. Throat: lips, mucosa, and tongue normal; teeth and gums normal Neck: supple, symmetrical, trachea midline Back: symmetric, no curvature. ROM normal. No CVA tenderness. Resp: non-labored on minimal [place] O2 Cardio: Nl rate GI: soft, non-tender; bowel sounds normal; no masses,  no organomegaly Male genitalia: normal, condom cath in place with copious yellow urine.  Extremities: stable rotational anomalies c/w spina bificda. Sacral and Lt buttocks  wounds with duoderm in place.  Pulses: 2+ and symmetric Skin: Skin color, texture, turgor normal. No rashes or lesions Lymph nodes: Cervical, supraclavicular, and axillary nodes normal. Neurologic: Grossly normal  Lab Results:   Recent Labs  07/17/15 1440  WBC 5.7  HGB 12.9*  HCT 39.0  PLT 176   BMET  Recent Labs  07/17/15 1440 07/19/15 0516  NA 142 139  K 3.9 4.2  CL 110 110  CO2 26 24  GLUCOSE 98 105*  BUN 15 13  CREATININE 0.98 1.18  CALCIUM 9.3 8.5*   PT/INR No results for input(s): LABPROT, INR in the last 72 hours. ABG No results for input(s): PHART, HCO3 in the last 72 hours.  Invalid input(s): PCO2, PO2  Studies/Results: No results found.  Anti-infectives: Anti-infectives    Start     Dose/Rate Route Frequency Ordered Stop   07/19/15 0600  gentamicin (GARAMYCIN) 400 mg in dextrose 5 % 100 mL IVPB     400 mg 110 mL/hr over 60 Minutes Intravenous 30 min pre-op 07/18/15 1641     07/18/15 2200  cefTRIAXone (ROCEPHIN) 1 g in dextrose 5 % 50 mL IVPB     1 g 100 mL/hr over 30 Minutes Intravenous Every 24 hours 07/18/15 1634     07/18/15 1635  gentamicin (GARAMYCIN) IVPB 80 mg  Status:  Discontinued     80 mg 100 mL/hr over 30 Minutes Intravenous 30 min pre-op 07/18/15 1635 07/18/15 1638   07/18/15 1100  gentamicin (GARAMYCIN) 400 mg in dextrose 5 % 50 mL IVPB     400 mg 120 mL/hr over 30 Minutes Intravenous 30 min pre-op 07/18/15  1014 07/18/15 1323      Assessment/Plan:  1 - Nephrolithiasis - NPO p MN for second stage ureteroscopic approach for goal of stone free. Start case tomorrow. Likely DC Saturday AM. Remain on IVF to help flush dust/ gravel between procedures.   2 - Recurrent UTI - remain on rocephin in house peri-procedurally given likely stone colonization.   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   [facility], Reizel Calzada 07/19/2015

## 2015-07-19 NOTE — Op Note (Signed)
NAMECRAIGE, PATEL NO.:  [id number]  MEDICAL RECORD NO.:  [id number]  LOCATION:  2025                         FACILITY:  [facility]  PHYSICIAN:  Alexis Frock, MD     DATE OF BIRTH:  [birth year]  DATE OF PROCEDURE:  07/18/2015                              OPERATIVE REPORT  DIAGNOSES:  Left partial staghorn kidney stone, left ureteral stent.  PROCEDURE: 1. Cystoscopy with left retrograde pyelogram interpretation. 2. Exchange of left ureteral stent, 6 x 24, no tether. 3. First-stage left ureteroscopy with laser lithotripsy. 4. Left retrograde pyelogram interpretation.  ESTIMATED BLOOD LOSS:  Nil.  COMPLICATIONS:  None.  SPECIMENS:  Left renal and ureteral stone fragments for compositional analysis.  FINDINGS: 1. Significant dysmorphic body habitus due to spina bifida with     rotational anomalies of the spine and pelvis and lower extremities. 2. Dominant left mid ureteral stone. 3. Left partial staghorn kidney stone. 4. Ablation and removal of greater than 50% of all stone material as     first-stage procedure today.  INDICATION:  Mr. Wimberly is a very pleasant and amazingly functional 59 year old gentleman with history of spina bifida status post multiple surgeries.  He has had problems with recurrent nephrolithiasis and has had a known nonobstructing left renal stone burden for some time, which we have surveilled and unfortunately developed a left obstructing ureteral stone with concomitant infection several years ago and was temporized right ureteral stenting and antibiotic therapy and this has only cleared his systemic inflammatory response.  He presented to the office and we discussed more options for definitive stone management including ureteroscopy to just address his ureteral stone versus approach for what was stone free and he wished proceed with the latter. His kidneys were very high and his abdomen overlapping his chest significantly due to  his spina bifida and it was not felt that a percutaneous approach to be excessively risky with a high probability of chest puncture.  As such, it was felt that staged ureteroscopic approach, although somewhat tedious would likely be the safest way to proceed and he wished to proceed with this with the goal of stone free. He has subsequently been on outpatient antibiotic therapy according to most recent cultures to reduce his colony counts of likely chronically colonized stone.  Informed consent was obtained and placed in the medical record.  PROCEDURE IN DETAIL:  The patient being John Scaff was verified. Procedure being left first stage ureteroscopic stone manipulation confirmed.  Procedure was carried out after aforementioned intravenous antibiotics administered.  General endotracheal anesthesia introduced. The patient was placed into a low lithotomy position very carefully to avoid any undue rotation or pressure on his lower extremities.  He has significant inner thigh fat pads which were taped out of the way to allow better access to his penis.  Penis, perineum, and proximal thighs were then prepped with iodine and cystourethroscopy was performed using a 22-French rigid cystoscope with 30-degree offset lens.  Inspection of the anterior and posterior were unremarkable.  The patient did have somewhat open bladder neck consistent with likely partial neurogenic function.  His bladder was unremarkable.  The distal end of left ureteral stent was seen in  situ.  This was grasped, brought to the level of the urethral meatus through which a 0.038 zip wire was advanced at the level of the upper pole and set aside as a safety wire.  An 8-French feeding tube was placed in the urinary bladder for pressure release. Next, semi-rigid ureteroscopy was performed in the distal half of the left ureter alongside a separate Sensor working wire.  A large ureteral stone was encountered.  His ureter was  also quite capacious and it was felt that given somewhat fusiform nature of the stone that it was amenable to simple basketing.  This was removed and safely basketed, but the escape basket removed and set aside for compositional analysis.  The semi-rigid ureteroscopy was then performed of the more proximal ureter allowing inspection of the distal two-thirds in total and the semi-rigid ureteroscope was then exchanged for a 12/14, 38-cm ureteral access sheath to the level proximal ureter over the Sensor working wire using continuous fluoroscopic guidance.  Next, flexible digital ureteroscopy was performed of the proximal ureter and systematic inspection of the left kidney.  As expected, there was dominant calyx containing a partial staghorn kidney stone.  Upon inspection, this was actually 3 to 4 smaller stones conglomerate total stone volume likely approximately 2 cm or so.  As such, holmium laser energy was applied to these stones first using a dusting technique using 0.3 joules and 30 Hz dusting approximately 25% of the total stone volume.  Next, sequential basketing was performed with an Escape basket removing numerable fragments sequentially.  This was performed for a total lithotomy time approximately 2 hours.  Following this, we had removed what was felt to be greater than 50% of total stone volume.  Hemostasis appeared excellent and given the risks and benefits, it was felt that it would be wise to conclude the first stage procedure today as to avoid excess lithotomy position and associated complications.  As such, the ureteroscope was removed as was the sheath under continuous ureteroscopic vision and no mucosal abnormalities were found.  Finally, a new 6 x 24 Contour type stent was placed with remaining safety wire using cystoscopic and fluoroscopic guidance.  Good proximal and distal coil were noted.  Bladder was emptied per cystoscope.  The new Codman catheter was applied as per  the patient's baseline and request and procedure was terminated.  The patient tolerated the procedure well with no apparent complications.  The patient was taken to the Postanesthesia Care Unit in a stable condition.          ______________________________ Alexis Frock, MD     TM/MEDQ  D:  07/18/2015  T:  07/19/2015  Job:  [id number]

## 2015-07-19 NOTE — Anesthesia Postprocedure Evaluation (Signed)
  Anesthesia Post-op Note  Patient: Patrick Orozco  Procedure(s) Performed: Procedure(s) (LRB): 1ST STAGE CYSTOSCOPY/URETEROSCOPY/HOLMIUM LASER/STENT REPLACEMENT (Left)  Patient Location: PACU  Anesthesia Type: General  Level of Consciousness: awake and alert   Airway and Oxygen Therapy: Patient Spontanous Breathing  Post-op Pain: mild  Post-op Assessment: Post-op Vital signs reviewed, Patient's Cardiovascular Status Stable, Respiratory Function Stable, Patent Airway and No signs of Nausea or vomiting  Last Vitals:  Filed Vitals:   07/19/15 0535  BP: 91/46  Pulse: 89  Temp: 36.8 C  Resp: 20    Post-op Vital Signs: stable   Complications: No apparent anesthesia complications

## 2015-07-20 ENCOUNTER — Inpatient Hospital Stay (HOSPITAL_COMMUNITY): Payer: Medicare Other | Admitting: Anesthesiology

## 2015-07-20 ENCOUNTER — Encounter (HOSPITAL_COMMUNITY)
Admission: RE | Disposition: A | Discharge status: Home / Self Care | Payer: Self-pay | Source: Ambulatory Visit | Attending: Urology

## 2015-07-20 ENCOUNTER — Encounter (HOSPITAL_COMMUNITY): Payer: Self-pay | Admitting: Anesthesiology

## 2015-07-20 ENCOUNTER — Inpatient Hospital Stay (HOSPITAL_COMMUNITY): Admission: RE | Admit: 2015-07-20 | Payer: Medicare Other | Source: Ambulatory Visit | Admitting: Urology

## 2015-07-20 HISTORY — PX: CYSTOSCOPY/URETEROSCOPY/HOLMIUM LASER/STENT PLACEMENT: SHX6546

## 2015-07-20 SURGERY — CYSTOSCOPY/URETEROSCOPY/HOLMIUM LASER/STENT PLACEMENT
Anesthesia: General | Site: Ureter | Laterality: Left

## 2015-07-20 MED ORDER — EPHEDRINE SULFATE 50 MG/ML IJ SOLN
INTRAMUSCULAR | Status: DC | PRN
  Administered 2015-07-20 (×2): 10 mg via INTRAVENOUS

## 2015-07-20 MED ORDER — FENTANYL CITRATE (PF) 100 MCG/2ML IJ SOLN: 25.0000 ug | INTRAMUSCULAR | Status: DC | PRN

## 2015-07-20 MED ORDER — LACTATED RINGERS IV SOLN
INTRAVENOUS | Status: DC | PRN
  Administered 2015-07-20 (×2): via INTRAVENOUS

## 2015-07-20 MED ORDER — PROMETHAZINE HCL 25 MG/ML IJ SOLN: 6.2500 mg | INTRAMUSCULAR | Status: DC | PRN

## 2015-07-20 MED ORDER — LIDOCAINE HCL (CARDIAC) 20 MG/ML IV SOLN
INTRAVENOUS | Status: DC | PRN
  Administered 2015-07-20: 50 mg via INTRAVENOUS

## 2015-07-20 MED ORDER — PROPOFOL 10 MG/ML IV BOLUS
INTRAVENOUS | Status: DC | PRN
  Administered 2015-07-20: 200 mg via INTRAVENOUS

## 2015-07-20 MED ORDER — SUCCINYLCHOLINE CHLORIDE 20 MG/ML IJ SOLN
INTRAMUSCULAR | Status: DC | PRN
  Administered 2015-07-20: 100 mg via INTRAVENOUS

## 2015-07-20 MED ORDER — NEOSTIGMINE METHYLSULFATE 10 MG/10ML IV SOLN
INTRAVENOUS | Status: DC | PRN
  Administered 2015-07-20: 4 mg via INTRAVENOUS

## 2015-07-20 MED ORDER — OXYCODONE-ACETAMINOPHEN 5-325 MG PO TABS: 1.0000 | ORAL_TABLET | Freq: Four times a day (QID) | ORAL | Status: DC | PRN

## 2015-07-20 MED ORDER — IOHEXOL 300 MG/ML  SOLN
INTRAMUSCULAR | Status: DC | PRN
  Administered 2015-07-20: 50 mL

## 2015-07-20 MED ORDER — PHENYLEPHRINE HCL 10 MG/ML IJ SOLN
INTRAMUSCULAR | Status: DC | PRN
  Administered 2015-07-20 (×4): 80 ug via INTRAVENOUS
  Administered 2015-07-20: 40 ug via INTRAVENOUS
  Administered 2015-07-20 (×2): 80 ug via INTRAVENOUS

## 2015-07-20 MED ORDER — SODIUM CHLORIDE 0.9 % IR SOLN
Status: DC | PRN
  Administered 2015-07-20: 4000 mL

## 2015-07-20 MED ORDER — CEPHALEXIN 500 MG PO CAPS
500.0000 mg | ORAL_CAPSULE | Freq: Two times a day (BID) | ORAL | Status: DC
Start: 2015-07-20 — End: 2015-10-19

## 2015-07-20 MED ORDER — FENTANYL CITRATE (PF) 100 MCG/2ML IJ SOLN
INTRAMUSCULAR | Status: DC | PRN
  Administered 2015-07-20: 100 ug via INTRAVENOUS

## 2015-07-20 MED ORDER — SENNOSIDES-DOCUSATE SODIUM 8.6-50 MG PO TABS: 1.0000 | ORAL_TABLET | Freq: Two times a day (BID) | ORAL | Status: DC

## 2015-07-20 MED ORDER — ROCURONIUM BROMIDE 100 MG/10ML IV SOLN
INTRAVENOUS | Status: DC | PRN
  Administered 2015-07-20: 20 mg via INTRAVENOUS

## 2015-07-20 MED ORDER — GLYCOPYRROLATE 0.2 MG/ML IJ SOLN
INTRAMUSCULAR | Status: DC | PRN
  Administered 2015-07-20: .6 mg via INTRAVENOUS

## 2015-07-20 MED ORDER — MIDAZOLAM HCL 5 MG/5ML IJ SOLN
INTRAMUSCULAR | Status: DC | PRN
  Administered 2015-07-20: 2 mg via INTRAVENOUS

## 2015-07-20 SURGICAL SUPPLY — 21 items
BAG URINE DRAINAGE (UROLOGICAL SUPPLIES) ×4 IMPLANT
BAG URO CATCHER STRL LF (DRAPE) ×4 IMPLANT
BASKET LASER NITINOL 1.9FR (BASKET) ×4 IMPLANT
BASKET STONE NCOMPASS (UROLOGICAL SUPPLIES) ×4 IMPLANT
BASKET ZERO TIP NITINOL 2.4FR (BASKET) IMPLANT
CATH FOLEY 2WAY SLVR  5CC 18FR (CATHETERS) ×2
CATH FOLEY 2WAY SLVR 5CC 18FR (CATHETERS) ×2 IMPLANT
CATH INTERMIT  6FR 70CM (CATHETERS) IMPLANT
CLOTH BEACON ORANGE TIMEOUT ST (SAFETY) ×4 IMPLANT
DRESSING DUODERM 4X4 STERILE (GAUZE/BANDAGES/DRESSINGS) ×8 IMPLANT
GLOVE BIOGEL M STRL SZ7.5 (GLOVE) ×4 IMPLANT
GOWN STRL REUS W/TWL LRG LVL3 (GOWN DISPOSABLE) ×8 IMPLANT
GUIDEWIRE ANG ZIPWIRE 038X150 (WIRE) ×4 IMPLANT
GUIDEWIRE STR DUAL SENSOR (WIRE) ×4 IMPLANT
MANIFOLD NEPTUNE II (INSTRUMENTS) ×4 IMPLANT
PACK CYSTO (CUSTOM PROCEDURE TRAY) ×4 IMPLANT
SHEATH ACCESS URETERAL 38CM (SHEATH) ×4 IMPLANT
STENT URET 6FRX24 CONTOUR (STENTS) ×4 IMPLANT
TUBE FEEDING 8FR 16IN STR KANG (MISCELLANEOUS) ×4 IMPLANT
TUBING CONNECTING 10 (TUBING) ×3 IMPLANT
TUBING CONNECTING 10' (TUBING) ×1

## 2015-07-20 NOTE — Transfer of Care (Signed)
Immediate Anesthesia Transfer of Care Note  Patient: Patrick Orozco  Procedure(s) Performed: Procedure(s): 2ND STAGE CYSTOSCOPY LEFT URETEROSCOPY STENT REPLACEMENT left retrograde (Left)  Patient Location: PACU  Anesthesia Type:General  Level of Consciousness: awake, alert  and oriented  Airway & Oxygen Therapy: Patient Spontanous Breathing and Patient connected to face mask oxygen  Post-op Assessment: Report given to RN and Post -op Vital signs reviewed and stable  Post vital signs: Reviewed and stable  Last Vitals:  Filed Vitals:   07/20/15 0438  BP: 144/72  Pulse: 76  Temp: 36.8 C  Resp: 20    Complications: No apparent anesthesia complications

## 2015-07-20 NOTE — Anesthesia Preprocedure Evaluation (Addendum)
Anesthesia Evaluation  Patient identified by MRN, date of birth, ID band Patient awake    Reviewed: Allergy & Precautions, NPO status , Patient's Chart, lab work & pertinent test results  Airway Mallampati: II  TM Distance: >3 FB Neck ROM: Full    Dental no notable dental hx.    Pulmonary shortness of breath, sleep apnea , former smoker,    Pulmonary exam normal breath sounds clear to auscultation       Cardiovascular hypertension, Pt. on medications and Pt. 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    GI/Hepatic Neg liver ROS, GERD  ,  Endo/Other  negative endocrine ROSMorbid obesity  Renal/GU Renal disease  negative genitourinary   Musculoskeletal negative musculoskeletal ROS (+)   Abdominal (+) + obese,   Peds negative pediatric ROS (+)  Hematology negative hematology ROS (+)   Anesthesia Other Findings   Reproductive/Obstetrics negative OB ROS                           Anesthesia Physical Anesthesia Plan  ASA: III  Anesthesia Plan: General   Post-op Pain Management:    Induction: Intravenous  Airway Management Planned: Oral ETT  Additional Equipment:   Intra-op Plan:   Post-operative Plan: Extubation in OR  Informed Consent: I have reviewed the patients History and Physical, chart, labs and discussed the procedure including the risks, benefits and alternatives for the proposed anesthesia with the patient or authorized representative who has indicated his/her understanding and acceptance.   Dental advisory given  Plan Discussed with: CRNA  Anesthesia Plan Comments:         Anesthesia Quick Evaluation

## 2015-07-20 NOTE — Progress Notes (Signed)
Subjective:  1 - Nephrolithiasis - s/p first stage left ureteroscopic stone manipulation 9/14 (day of admission) for 2cm left lower pole partial staghorn and 77m ureteral stone. Had left stent piror to admission.   2 - Recurrent UTI - Pt with recurrent UTI including episode pyelo 2016. [place] e. Coli sens bactrim, rocephin, gent, keflex. Had been on Bactrim prior to this admission, and rocephin in house as his stone is likely colonized.  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 Wounds examined intra-op 9/14 and clean-based, duoerm applied. He has colostomy which prevent fecal contamination.   Today "Barnabas" is prepared for second stage surgery on his left kidney stone. NO interval fevers. Appreicate wound osomy RN assistance.   Objective: Vital signs in last 24 hours: Temp:  [98.3 F (36.8 C)-98.4 F (36.9 C)] 98.3 F (36.8 C) (09/16 0438) Pulse Rate:  [54-76] 76 (09/16 0438) Resp:  [20] 20 (09/16 0438) BP: (92-144)/(50-72) 144/72 mmHg (09/16 0438) SpO2:  [97 %-100 %] 99 % (09/16 0438)    Intake/Output from previous day: 09/15 0701 - 09/16 0700 In: 1592.5 [P.O.:340; I.V.:1202.5; IV Piggyback:50] Out: 2700 [Urine:2700] Intake/Output this shift: Total I/O In: 350 [I.V.:300; IV Piggyback:50] Out: 950 [Urine:950]  General appearance: alert, cooperative, appears stated age and stigmata of spinal dysraphism Ears: NL ext exam Nose: Nares normal. Septum midline. Mucosa normal. No drainage or sinus tenderness. Throat: lips, mucosa, and tongue normal; teeth and gums normal Neck: supple, symmetrical, trachea midline Back: symmetric, no curvature. ROM normal. No CVA tenderness. Resp: non-labored Cardio: Nl ratre GI: soft, non-tender; bowel sounds normal; no masses,  no organomegaly Extremities: extremities normal, atraumatic, no cyanosis or edema Pulses: 2+ and symmetric Skin: Skin color, texture, turgor normal. No rashes or  lesions Lymph nodes: Cervical, supraclavicular, and axillary nodes normal. Neurologic: Grossly normal Incision/Wound: condom cath in place. Scaral and left leg wound with duoderm in place. LLQ colostomy patent.   Lab Results:   Recent Labs  07/17/15 1440  WBC 5.7  HGB 12.9*  HCT 39.0  PLT 176   BMET  Recent Labs  07/17/15 1440 07/19/15 0516  NA 142 139  K 3.9 4.2  CL 110 110  CO2 26 24  GLUCOSE 98 105*  BUN 15 13  CREATININE 0.98 1.18  CALCIUM 9.3 8.5*   PT/INR No results for input(s): LABPROT, INR in the last 72 hours. ABG No results for input(s): PHART, HCO3 in the last 72 hours.  Invalid input(s): PCO2, PO2  Studies/Results: No results found.  Anti-infectives: Anti-infectives    Start     Dose/Rate Route Frequency Ordered Stop   07/19/15 0600  gentamicin (GARAMYCIN) 400 mg in dextrose 5 % 100 mL IVPB     400 mg 110 mL/hr over 60 Minutes Intravenous 30 min pre-op 07/18/15 1641     07/18/15 2200  cefTRIAXone (ROCEPHIN) 1 g in dextrose 5 % 50 mL IVPB     1 g 100 mL/hr over 30 Minutes Intravenous Every 24 hours 07/18/15 1634     07/18/15 1635  gentamicin (GARAMYCIN) IVPB 80 mg  Status:  Discontinued     80 mg 100 mL/hr over 30 Minutes Intravenous 30 min pre-op 07/18/15 1635 07/18/15 1638   07/18/15 1100  gentamicin (GARAMYCIN) 400 mg in dextrose 5 % 50 mL IVPB     400 mg 120 mL/hr over 30 Minutes Intravenous 30 min pre-op 07/18/15 1014 07/18/15 1323      Assessment/Plan:  1 - Nephrolithiasis - proceed as planned today with second stage left ureteroscopy. Risk, benefits, alternatves discussed previously and reiterated.   2 - Recurrent UTI - remain on rocephin in house peri-procedurally given likely stone colonization.   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   [facility], [place] 07/20/2015

## 2015-07-20 NOTE — Brief Op Note (Signed)
07/18/2015 - 07/20/2015  9:16 AM  PATIENT:  Patrick Orozco  59 y.o. male  PRE-OPERATIVE DIAGNOSIS:  LEFT RENAL STONES, QUADRIPLEGIA  POST-OPERATIVE DIAGNOSIS:  left renal stones quadriplegia  PROCEDURE:  Procedure(s): 2ND STAGE CYSTOSCOPY LEFT URETEROSCOPY STENT REPLACEMENT (Left)  SURGEON:  Surgeon(s) and Role:    * Alexis Frock, MD - Primary  PHYSICIAN ASSISTANT:   ASSISTANTS: none   ANESTHESIA:   general  EBL:  Total I/O In: 1000 [I.V.:1000] Out: -   BLOOD ADMINISTERED:none  DRAINS: 51F foley to gravity with JJ stent string tied to it  LOCAL MEDICATIONS USED:  NONE  SPECIMEN:  Source of Specimen:  left renal stone fragements  DISPOSITION OF SPECIMEN:  [facility] for compositional analysis  COUNTS:  YES  TOURNIQUET:  * No tourniquets in log *  DICTATION: .Other Dictation: Dictation Number [id number]  PLAN OF CARE: Admit to inpatient   PATIENT DISPOSITION:  PACU - hemodynamically stable.   Delay start of Pharmacological VTE agent (>24hrs) due to surgical blood loss or risk of bleeding: not applicable

## 2015-07-20 NOTE — Anesthesia Postprocedure Evaluation (Deleted)
  Anesthesia Post-op Note  Patient: Patrick Orozco  Procedure(s) Performed: Procedure(s) (LRB): 2ND STAGE CYSTOSCOPY LEFT URETEROSCOPY STENT REPLACEMENT left retrograde (Left)  Patient Location: PACU  Anesthesia Type: General  Level of Consciousness: awake and alert   Airway and Oxygen Therapy: Patient Spontanous Breathing  Post-op Pain: mild  Post-op Assessment: Post-op Vital signs reviewed, Patient's Cardiovascular Status Stable, Respiratory Function Stable, Patent Airway and No signs of Nausea or vomiting  Last Vitals:  Filed Vitals:   07/20/15 1450  BP: 102/65  Pulse: 67  Temp: 36.7 C  Resp: 18    Post-op Vital Signs: stable   Complications: No apparent anesthesia complications

## 2015-07-20 NOTE — Anesthesia Procedure Notes (Signed)
Procedure Name: Intubation Date/Time: 07/20/2015 7:37 AM Performed by: Dimas Millin, AMY F Pre-anesthesia Checklist: Patient identified, Emergency Drugs available, Suction available, Patient being monitored and Timeout performed Patient Re-evaluated:Patient Re-evaluated prior to inductionOxygen Delivery Method: Circle system utilized Preoxygenation: Pre-oxygenation with 100% oxygen Intubation Type: IV induction Ventilation: Mask ventilation without difficulty Laryngoscope Size: Glidescope and 4 Grade View: Grade I Tube type: Oral Tube size: 7.5 mm Number of attempts: 1 Airway Equipment and Method: Stylet Placement Confirmation: ETT inserted through vocal cords under direct vision,  positive ETCO2 and breath sounds checked- equal and bilateral Secured at: 22 cm Tube secured with: Tape Dental Injury: Teeth and Oropharynx as per pre-operative assessment  Difficulty Due To: Difficulty was anticipated

## 2015-07-20 NOTE — Clinical Documentation Improvement (Signed)
Urology  Can the diagnosis of pressure ulcer be further specified with the location and the stage of the pressure ulcer? Thank you    Document Site with laterality - Elbow, Back (upper/lower), Sacral, Hip, Buttock, Ankle, Heel, Head, Other (Specify)  Pressure Ulcer Stage - Stage1, Stage 2, Stage 3, Stage 4, Unstageable, Unspecified, Unable to Clinically Determine  Other  Clinically Undetermined    Supporting Information:  Pressure ulcer noted on hospital problem list   Please exercise your independent, professional judgment when responding. A specific answer is not anticipated or expected.   Thank You,  [place] [phone]

## 2015-07-20 NOTE — Anesthesia Postprocedure Evaluation (Signed)
  Anesthesia Post-op Note  Patient: Patrick Orozco  Procedure(s) Performed: Procedure(s) (LRB): 2ND STAGE CYSTOSCOPY LEFT URETEROSCOPY STENT REPLACEMENT left retrograde (Left)  Patient Location: PACU  Anesthesia Type: General  Level of Consciousness: awake and alert   Airway and Oxygen Therapy: Patient Spontanous Breathing  Post-op Pain: mild  Post-op Assessment: Post-op Vital signs reviewed, Patient's Cardiovascular Status Stable, Respiratory Function Stable, Patent Airway and No signs of Nausea or vomiting  Last Vitals:  Filed Vitals:   07/20/15 1450  BP: 102/65  Pulse: 67  Temp: 36.7 C  Resp: 18    Post-op Vital Signs: stable   Complications: No apparent anesthesia complications

## 2015-07-20 NOTE — Discharge Instructions (Signed)
1 - You may have urinary urgency (bladder spasms) and bloody urine on / off with stent in place. This is normal. ° °2 - Call MD or go to ER for fever >102, severe pain / nausea / vomiting not relieved by medications, or acute change in medical status ° °

## 2015-07-21 NOTE — Plan of Care (Signed)
Problem: Phase I Progression Outcomes Goal: OOB as tolerated unless otherwise ordered Outcome: Not Applicable Date Met:  07/21/15 Pt is paralyzed from the waist down     

## 2015-07-21 NOTE — Progress Notes (Signed)
Went over discharge information with patient, all questions answered.  Prescriptions given.  Patient stated he needed prescriptions called in.  Paged MD to see if they could be- also reccommended that patient have someone else go pick up prescriptions.  Gave hospital number if he has any questions.  Foley and leg bag teaching done.  IV taken out.

## 2015-07-21 NOTE — Discharge Summary (Signed)
Date of admission: 07/18/2015  Date of discharge: 07/21/2015  Admission diagnosis: Nephrolithiasis  Discharge diagnosis: Same    History and Physical: For full details, please see admission history and physical. Briefly, Patrick Orozco is a 59 y.o. year old patient with spina bifida and nephrolithiasis.   Hospital Course: The patient went for USE on 07/20/15. He tolerated the procedure well. Given his spina bifida he was kept overnight for observation. He did well with no issues. On POD#1 he was ready for discharge and was sent home with foley in place with plans for follow up.  Laboratory values: No results for input(s): HGB, HCT in the last 72 hours.  Recent Labs  07/19/15 0516  CREATININE 1.18    Disposition: Home  Discharge instruction:  1. You may see some blood in the urine and may have some burning with urination for 48-72 hours. You also may notice that you have to urinate more frequently or urgently after your procedure which is normal.  2. You should call should you develop an inability urinate, fever > 101, persistent nausea and vomiting that prevents you from eating or drinking to stay hydrated.  3. If you have a stent, you will likely urinate more frequently and urgently until the stent is removed and you may experience some discomfort/pain in the lower abdomen and flank especially when urinating. You may take pain medication prescribed to you if needed for pain. You may also intermittently have blood in the urine until the stent is removed. 4. If you have a catheter, you will be taught how to take care of the catheter by the nursing staff prior to discharge from the hospital.  You may periodically feel a strong urge to void with the catheter in place.  This is a bladder spasm and most often can occur when having a bowel movement or moving around. It is typically self-limited and usually will stop after a few minutes.  You may use some Vaseline or Neosporin around the tip of  the catheter to reduce friction at the tip of the penis. You may also see some blood in the urine.  A very small amount of blood can make the urine look quite red.  As long as the catheter is draining well, there usually is not a problem.  However, if the catheter is not draining well and is bloody, you should call the office [phone]) to notify us.  You have a Foley catheter in place which drains urine out of your bladder. There are two parts: one part has a number and likely a colored band around it - this port should NOT be manipulated; the other piece connects to the drainage tubing and drainage bag. Before discharge from the hospital, your nurse will instruct you how to care for your foley catheter.   A foley catheter drains your bladder by gravity. The drainage bag should always be below the level of your bladder. If you are wearing a leg bag, it should be below your waist, and at night, the bag should lay on the floor next to you in bed.   Sometimes, a piece of tissue or a blood clot can get caught in the foley catheter and cause it not to drain properly. In this case, you may leak urine around the catheter and you may feel like your bladder is full. In this case, you should disconnect the catheter from the drainage bag and flush the catheter with ~30cc of saline (available at [facility]). If this  doesn't help, you should come in to be evaluated.  Other times, even though your foley catheter is draining well, you have the feeling that you have a full bladder, and you may leak around your catheter during painful bladder spasms. If this is the case, a medication called oxybutynin (or Ditropan) may help. It is normal to see some blood in your urine from time to time when you have a foley catheter. This can be due to irritation from the foley catheter inside the bladder. However, if your urine is the color of tomato juice with quarter-sized clots, this can clog the catheter, and you should call us for  instructions. A physician will likely need to evaluate you.  If you are unable to get in touch with anyone and you feel it truly is an emergency, proceed to the nearest ER or call an ambulance.  Discharge medications:    Medication List    TAKE these medications        cephALEXin 500 MG capsule  Commonly known as:  KEFLEX  Take 1 capsule (500 mg total) by mouth 2 (two) times daily. X 5 days to prevent post-op infection     Fluticasone-Salmeterol 250-50 MCG/DOSE Aepb  Commonly known as:  ADVAIR  Inhale 1 puff into the lungs 2 (two) times daily as needed (shortness of breath).     metoprolol succinate 50 MG 24 hr tablet  Commonly known as:  TOPROL-XL  Take 50 mg by mouth daily.     oxyCODONE-acetaminophen 5-325 MG per tablet  Commonly known as:  ROXICET  Take 1-2 tablets by mouth every 6 (six) hours as needed for moderate pain or severe pain. Post-operatively     senna-docusate 8.6-50 MG per tablet  Commonly known as:  Senokot-S  Take 1 tablet by mouth 2 (two) times daily. While taking pain meds to prevent constipation     simvastatin 20 MG tablet  Commonly known as:  ZOCOR  Take 20 mg by mouth at bedtime.        Followup:      Follow-up Information    Follow up with Alexis Frock, MD On 08/06/2015.   Specialty:  Urology   Why:  at 71 AM for MD visit   Contact information:   [place] [address] [phone]       Follow up with [facility].   Why:  We will also call you to arrange nurse visit early next week for catheer and stent removal   Contact information:   [place] [phone]

## 2015-07-23 NOTE — Op Note (Signed)
NAMEKATHY, WARES NO.:  [id number]  MEDICAL RECORD NO.:  [id number]  LOCATION:  6812                         FACILITY:  [facility]  PHYSICIAN:  Alexis Frock, MD     DATE OF BIRTH:  Sep 21, 1956  DATE OF PROCEDURE:  07/20/2015                              OPERATIVE REPORT  DIAGNOSES:  Left partial staghorn kidney stone, left ureteral stent.  PROCEDURE: 1. Cystoscopy with left retrograde pyelogram interpretation. 2. Left ureteroscopy with basketing of stones. 3. Exchange of left ureteral stent, 6 x 24 contour with tether to a     Foley catheter.  All second-stage.   COMPLICATIONS:  None.  SPECIMENS:  Left residual renal stone fragments given to the patient.  FINDINGS: 1. Residual approximately 1 cm total volume renal stone fragments     mostly in the lateral calyx and location of prior partial staghorn     stone. 2. Complete resolution of all stone fragments larger than 1 mm     following basket extraction ureteroscopy. 3. Excellent placement of left ureteral stent.  The proximal end upper     pole, distal in urinary bladder.  Tether was fashioned to a Foley     catheter. 4. Straight drain.  INDICATION:  Mr. Godown is a pleasant, remarkably active 59 year old gentleman with long history of spina bifida and paraplegia.  He has had problems of recurrent nephrolithiasis.  He has had a relatively large left intrarenal stone burden which had been asymptomatic for number of years.  He suddenly developed a left ureteral stone and urosepsis requiring stenting after he clears his infectious parameters.  Options were discussed for definitive management and due to his quite abnormal body habitus with his kidneys residing very high.  The lower pole of the ribs was felt that a stage ureteroscopic approach goal of stone free will be one way to proceed and he was received with that and underwent first-stage procedure on July 18, 2015 which he tolerated very  very well and felt that we were able to remove the 50% more of stone burden at that time.  He was in the hospital since he is not had any infectious parameters.  He presents today for second-stage procedure.  The goal of stone free, before since and placed in medical record.  PROCEDURE IN DETAIL:  The patient being Jahrell Syfert was verified. Procedure being left second stage ureteroscopic stone manipulation was confirmed.  Procedure was carried out after aforementioned intravenous antibiotics administered.  Endotracheal anesthesia introduced.  The patient was placed into a low lithotomy position.  His left leg was minimally flexed with the knee as per his body habitus and he required some taping of his copious thigh fat away from his perineum to access better to this.  Sterile field was created by prepping and draping the patient's penis, perineum, proximal thighs using iodine x3. Next, cystourethroscopy was performed using 23-French rigid cystoscope with 30-degree offset lens.  Just one of the ureteral stent was seen in situ, it was grasped, brought to the level of the urethral meatus 0.038 zip wire was advanced to the level of the upper pole, set aside as a safety wire.  An 8-French feeding  tube was placed in urinary bladder for pressure release.  Next, semi-rigid ureteroscopy was performed in the distal half of the left ureter alongside a separate sensor working wire. No mucosal abnormalities or significant  stones were seen.  The semi- rigid ureteroscope was then exchanged for 12/14, 38-cm ureteral access sheath at the level of proximal ureter using continuous fluoroscopic guidance.  Next, flexible digital ureteroscopy was performed.  The proximal ureter and systematic inspection the left kidney.  As inspected, there were multifocal pockets of residual stone fragments. Total stone volume was approximately 1 cm.  These are mostly in the upper pole and then the lateral pole calyx.   Unless these appeared to be amenable to simple basketing as such, an escape basket was used to grasp full the dominant fragments removed and set aside for compositional analysis.  There are and numerable smaller fragments approximately 0.5 mm range and then in compass basket was used to sweep these and is much could be removed was safely performed where still some much smaller residual stone fragment in the lateral calyx of previous staghorn.  These at this point are not amenable to basketing technique.  As such a blood mop technique was used in which 5 mL of autologous blood was injected via the ureteroscope under direct vision into the calyx in question.  This was allowed to coagulate for 5 minutes and then using the encompass basket again, the clot was removed and this resulted rather and removal of approximately 75% of these smaller stones.  Following these maneuvers, there was excellent hemostasis.  No evidence of perforation and complete resolution of all stone fragments larger than one-third millimeter until we achieved the goals of procedure today as the patient did have some smaller almost dust in the kidney the would likely pass with the interval time, it was felt that stenting would be warranted as such.  The ureteral access sheath was removed under continuous ureteroscopic vision and a new 6 x 24 Contour type stent was placed using fluoroscopic guidance.  Tether was left in place.  Foley catheter was placed per urethra and the tether was fashioned to this.  This method of stenting and bladder management is chosen if the patient uses Codman catheter Foley at baseline and dysmorphic body habitus would make a cystoscopic retrieval in the office quite difficult.  The procedure was then terminated.  The patient tolerated the procedure well with no apparent complications.  The patient was taken to the Postanesthesia Care unit in stable condition.           ______________________________ Alexis Frock, MD     TM/MEDQ  D:  07/20/2015  T:  07/20/2015  Job:  [id number]

## 2015-10-19 ENCOUNTER — Emergency Department (HOSPITAL_COMMUNITY)
Admission: EM | Admit: 2015-10-19 | Discharge: 2015-10-19 | Disposition: A | Discharge status: Home / Self Care | Payer: Medicare Other | Attending: Emergency Medicine | Admitting: Emergency Medicine

## 2015-10-19 ENCOUNTER — Encounter (HOSPITAL_COMMUNITY): Payer: Self-pay | Admitting: Emergency Medicine

## 2015-10-19 DIAGNOSIS — Z9889 Other specified postprocedural states: Secondary | ICD-10-CM | POA: Insufficient documentation

## 2015-10-19 DIAGNOSIS — Z8659 Personal history of other mental and behavioral disorders: Secondary | ICD-10-CM | POA: Insufficient documentation

## 2015-10-19 DIAGNOSIS — N390 Urinary tract infection, site not specified: Secondary | ICD-10-CM | POA: Diagnosis not present

## 2015-10-19 DIAGNOSIS — Z87891 Personal history of nicotine dependence: Secondary | ICD-10-CM | POA: Insufficient documentation

## 2015-10-19 DIAGNOSIS — Z87442 Personal history of urinary calculi: Secondary | ICD-10-CM | POA: Insufficient documentation

## 2015-10-19 DIAGNOSIS — Z8669 Personal history of other diseases of the nervous system and sense organs: Secondary | ICD-10-CM | POA: Diagnosis not present

## 2015-10-19 DIAGNOSIS — R109 Unspecified abdominal pain: Secondary | ICD-10-CM | POA: Diagnosis present

## 2015-10-19 DIAGNOSIS — E785 Hyperlipidemia, unspecified: Secondary | ICD-10-CM | POA: Insufficient documentation

## 2015-10-19 DIAGNOSIS — I10 Essential (primary) hypertension: Secondary | ICD-10-CM | POA: Diagnosis not present

## 2015-10-19 DIAGNOSIS — Z79899 Other long term (current) drug therapy: Secondary | ICD-10-CM | POA: Diagnosis not present

## 2015-10-19 DIAGNOSIS — Z862 Personal history of diseases of the blood and blood-forming organs and certain disorders involving the immune mechanism: Secondary | ICD-10-CM | POA: Insufficient documentation

## 2015-10-19 DIAGNOSIS — Z8719 Personal history of other diseases of the digestive system: Secondary | ICD-10-CM | POA: Insufficient documentation

## 2015-10-19 LAB — COMPREHENSIVE METABOLIC PANEL
ALBUMIN: 3.5 g/dL (ref 3.5–5.0)
ALK PHOS: 85 U/L (ref 38–126)
ALT: 16 U/L — ABNORMAL LOW (ref 17–63)
ANION GAP: 8 (ref 5–15)
AST: 26 U/L (ref 15–41)
BUN: 17 mg/dL (ref 6–20)
CALCIUM: 9.3 mg/dL (ref 8.9–10.3)
CHLORIDE: 107 mmol/L (ref 101–111)
CO2: 23 mmol/L (ref 22–32)
Creatinine, Ser: 1.05 mg/dL (ref 0.61–1.24)
GFR calc Af Amer: >60 mL/min (ref >60)
GFR calc non Af Amer: >60 mL/min (ref >60)
GLUCOSE: 85 mg/dL (ref 65–99)
POTASSIUM: 4.1 mmol/L (ref 3.5–5.1)
SODIUM: 138 mmol/L (ref 135–145)
Total Bilirubin: 0.6 mg/dL (ref 0.3–1.2)
Total Protein: 7.5 g/dL (ref 6.5–8.1)

## 2015-10-19 LAB — URINE MICROSCOPIC-ADD ON

## 2015-10-19 LAB — URINALYSIS, ROUTINE W REFLEX MICROSCOPIC
BILIRUBIN URINE: NEGATIVE
GLUCOSE, UA: NEGATIVE mg/dL
Ketones, ur: NEGATIVE mg/dL
Nitrite: POSITIVE — AB
PH: 6 (ref 5.0–8.0)
Protein, ur: NEGATIVE mg/dL
SPECIFIC GRAVITY, URINE: 1.016 (ref 1.005–1.030)

## 2015-10-19 LAB — CBC
HEMATOCRIT: 43.3 % (ref 39.0–52.0)
HEMOGLOBIN: 14.1 g/dL (ref 13.0–17.0)
MCH: 30.9 pg (ref 26.0–34.0)
MCHC: 32.6 g/dL (ref 30.0–36.0)
MCV: 95 fL (ref 78.0–100.0)
Platelets: 163 10*3/uL (ref 150–400)
RBC: 4.56 MIL/uL (ref 4.22–5.81)
RDW: 14 % (ref 11.5–15.5)
WBC: 4.9 10*3/uL (ref 4.0–10.5)

## 2015-10-19 LAB — LIPASE, BLOOD: LIPASE: 30 U/L (ref 11–51)

## 2015-10-19 MED ORDER — CEPHALEXIN 500 MG PO CAPS
500.0000 mg | ORAL_CAPSULE | Freq: Once | ORAL | Status: AC
  Administered 2015-10-19: 500 mg via ORAL
  Filled 2015-10-19: qty 1

## 2015-10-19 MED ORDER — CEPHALEXIN 500 MG PO CAPS: 500.0000 mg | ORAL_CAPSULE | Freq: Four times a day (QID) | ORAL | Status: DC

## 2015-10-19 MED ORDER — SODIUM CHLORIDE 0.9 % IV BOLUS (SEPSIS)
1000.0000 mL | Freq: Once | INTRAVENOUS | Status: AC
  Administered 2015-10-19: 1000 mL via INTRAVENOUS

## 2015-10-19 NOTE — Progress Notes (Signed)
CM spoke with pt who states he was placed in [facility] by [facility] staff on Tuesday 10/16/15 and he does not like the apartments States he lived in his previous apartment for "a long time" and feels "housing authority was trying to push me out and in this apartment"  Reports attempts to reach members of housing authority today without success.  Cm assisted to pick up pt cell phone off ED Room floor. CM discussed with the pt that there is not a Chief Financial Officer at any [facility] that can assist with changing him to another apartment Pt states "that means I will not be able to speak with anyone until Monday"  Cm looked at the clock on the wall and noted it was 1800 and informed pt that at 1800 she did not believe the housing authority staff members were still available  Discussed with pt that if MD unable to find a medical reason for admission that he would be d/c back to his apartment and he would need to contact a member of the housing authority Cm provided pt again with the housing authority number  CM updated ED RNs x 2 and EDP

## 2015-10-19 NOTE — ED Notes (Addendum)
[place] assisted IV removed; pt complaint of pain and small area of infiltration noted with fluid running into site. Allen aware and requested additional IV inserted to administer bolus as ordered. Catalina Antigua Quick reports will attempt [place] assisted IV; IV team consult as placed. Per Zenia Resides pt if possible pt may attempt clean catch urine sample; not via new condom catheter/leg bag. If not able to obtain clean catch must in and out.

## 2015-10-19 NOTE — ED Notes (Signed)
Patrick Orozco and Patrick Orozco unsuccessful in and out attempts; twice; per [name] give NaCl 0.9% bolus to encourage void.

## 2015-10-19 NOTE — ED Notes (Signed)
Attempted to get labs, pt pulled hand back after being stuck

## 2015-10-19 NOTE — ED Notes (Addendum)
Attempt to start IV unsuccessful; Ryland at beside attempting blood draw. Pt uses condom catheter chronic use; pt has new/unused condom catheter and given leg bag; pt request self change. Will collect urine when sample adequate from new catheter/bag.

## 2015-10-19 NOTE — ED Notes (Addendum)
Per Zenia Resides collect urine via in and out catheter NOT condom catheter as pt previously requested. Urine to be collected post blood draw.

## 2015-10-19 NOTE — ED Provider Notes (Signed)
17:40- patient's nurse informed me that the patient told her that he would rather die than go back to his current domicile, at [facility]. Nurse case manager interviewed the patient and recommended that he talk to the [facility] [place] regarding a change in his domicile.  19:40- patient remains comfortable, has eaten and has no further complaints. Vital signs are normal. I discussed findings with the patient, there is no clear evidence for acute urinary tract infection. No cells are increased, bacteria many, or pulsatile on macroscopic 6-30. Nitrite is positive.This was a collection by clean-catch. No fever, no white blood cell count elevation. Urine culture has been ordered. Patient prefers to start a antibiotic at this time. Will write prescription for Keflex and recommend PCP follow-up in one week.    Daleen Bo, MD 10/19/15 [phone]

## 2015-10-19 NOTE — ED Notes (Signed)
Care management at bedside.

## 2015-10-19 NOTE — ED Notes (Signed)
Bed: WS39 Expected date:  Expected time:  Means of arrival:  Comments: Ems-dysuria 59yo

## 2015-10-19 NOTE — Progress Notes (Addendum)
CM consulted by ED [place], Santiago Glad who states pt reported "want to go anywhere else" than where he lives  ED RN notes pt from [facility] which CM goggled and found to be apartments  [employer] at [place], [place], [place] [address]  [place] provided pt with [employer] of [place], [place]:  [place]: [phone] (main) [email or web address]/ Social Security Office for [place], [place] [address] Office Address: [place] [place], [place] [address] Phone (Local): [phone] Phone ([place]): [phone] TTY : [phone]  _HOUSING/SHELTERS  Housing Authorities: [place]: [phone]  . FINANCIAL ASSISTANCE resources provides financial assistance of up to $50.00 to help pay for prescription medications on Mondays, first come first serve basis [phone] Housing  [facility] ([place] [address], [place], [place] [address] [phone] fax [phone])Homeless [place] Organizing for justice, equality, dignity, worth & the enormous potential of all people.[facility] [place] [address] from [place] to 8 am Monday, Tuesday, Thursday & Friday [facility] [address] (previously called [place]) [place],  [address] Contact: [phone]   [facility] ([place]!) Contact: [place] [phone]

## 2015-10-19 NOTE — ED Notes (Signed)
IV team at bedside 

## 2015-10-19 NOTE — ED Provider Notes (Signed)
CSN: [id number]     Arrival date & time 10/19/15  1201 History   First MD Initiated Contact with Patient 10/19/15 1312     Chief Complaint  Patient presents with  . Abdominal Pain     (Consider location/radiation/quality/duration/timing/severity/associated sxs/prior Treatment) HPI Comments: Patient here complaining of left-sided flank pain similar to his prior UTIs. Denies any fever or chills. No vomiting noted. Has noticed some foul-smelling urine. Denies any cough or congestion. Symptoms have been persistent 1 day and no treatment use prior to arrival. Nothing makes them better  Patient is a 59 y.o. male presenting with abdominal pain. The history is provided by the patient.  Abdominal Pain   Past Medical History  Diagnosis Date  . Spina bifida   . Hypertension   . Hyperlipidemia   . Kidney stones   . UTI (lower urinary tract infection)   . Depression     wife passed July 2013  . Shortness of breath     occassionally  . GERD (gastroesophageal reflux disease)   . Kidney stones   . Colostomy in place [facility])   . Neurogenic bladder     uses condom cath  . Sarcoidosis ([name])   . Sleep apnea     pt denies sleep apnea pt states never followed through with sleep study; pt scored 5 per stop bang tool per PAT visit 07/17/2015; results sent to PCP Dr Ayesha Rumpf   Past Surgical History  Procedure Laterality Date  . Spine surgery      multiple surgeries related to spina bifida  . Colostomy    . 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 Endobronchial ultrasound Bilateral 04/25/2013    Procedure: ENDOBRONCHIAL ULTRASOUND;  Surgeon: Collene Gobble, MD;  Location: [facility] ENDOSCOPY;  Service: Cardiopulmonary;  Laterality: Bilateral;  . Cystoscopy w/ ureteral stent placement Left 06/15/2015    Procedure: CYSTOSCOPY WITH STENT REPLACEMENT;  Surgeon: Raynelle Bring, MD;  Location: [facility] ORS;  Service: Urology;  Laterality: Left;  . Cystoscopy/ureteroscopy/holmium laser/stent placement Left 07/18/2015    Procedure: 1ST STAGE CYSTOSCOPY/URETEROSCOPY/HOLMIUM LASER/STENT REPLACEMENT;  Surgeon: Alexis Frock, MD;  Location: [facility] ORS;  Service: Urology;  Laterality: Left;  . Cystoscopy/ureteroscopy/holmium laser/stent placement Left 07/20/2015    Procedure: 2ND STAGE CYSTOSCOPY LEFT URETEROSCOPY STENT REPLACEMENT left retrograde;  Surgeon: Alexis Frock, MD;  Location: [facility] ORS;  Service: Urology;  Laterality: Left;   Family History  Problem Relation Age of Onset  . Cancer Mother     ?lung  . Cancer Maternal Grandfather     lung   Social History  Substance Use Topics  . Smoking status: Former Smoker -- 0.25 packs/day for 22 years    Types: Cigarettes    Quit date: 11/03/1992  . Smokeless tobacco: Never Used  . Alcohol Use: No    Review of Systems  Gastrointestinal: Positive for abdominal pain.  All other systems reviewed and are negative.     Allergies  Ivp dye  Home Medications   Prior to Admission medications   Medication Sig Start Date End Date Taking? Authorizing Provider  cephALEXin (KEFLEX) 500 MG capsule Take 1 capsule (500 mg total) by mouth 2 (two) times daily. X 5 days to prevent post-op infection 07/20/15   Alexis Frock, MD  Fluticasone-Salmeterol (ADVAIR) 250-50 MCG/DOSE AEPB Inhale 1 puff into the lungs 2 (two) times daily as needed (shortness of breath).     Historical Provider, MD  metoprolol (TOPROL-XL) 50 MG 24 hr tablet Take 50 mg  by mouth daily.     Historical Provider, MD  oxyCODONE-acetaminophen (ROXICET) 5-325 MG per tablet Take 1-2 tablets by mouth every 6 (six) hours as needed for moderate pain or severe pain. Post-operatively 07/20/15   Alexis Frock, MD  senna-docusate (SENOKOT-S) 8.6-50 MG per tablet Take 1 tablet by mouth 2 (two) times daily. While taking pain meds to prevent constipation 07/20/15   Alexis Frock, MD  simvastatin (ZOCOR) 20 MG tablet Take 20 mg by mouth at bedtime.     Historical Provider, MD   BP 155/84 mmHg  Pulse 63  Temp(Src) 97.5  F (36.4 C) (Oral)  Resp 15  SpO2 100% Physical Exam  Constitutional: He is oriented to person, place, and time. He appears well-developed and well-nourished.  Non-toxic appearance. No distress.  HENT:  Head: Normocephalic and atraumatic.  Eyes: Conjunctivae, EOM and lids are normal. Pupils are equal, round, and reactive to light.  Neck: Normal range of motion. Neck supple. No tracheal deviation present. No thyroid mass present.  Cardiovascular: Normal rate, regular rhythm and normal heart sounds.  Exam reveals no gallop.   No murmur heard. Pulmonary/Chest: Effort normal and breath sounds normal. No stridor. No respiratory distress. He has no decreased breath sounds. He has no wheezes. He has no rhonchi. He has no rales.  Abdominal: Soft. Normal appearance and bowel sounds are normal. He exhibits no distension. There is no tenderness. There is no rigidity, no rebound, no guarding and no CVA tenderness.  Musculoskeletal: Normal range of motion. He exhibits no edema or tenderness.  Neurological: He is alert and oriented to person, place, and time. No cranial nerve deficit or sensory deficit. GCS eye subscore is 4. GCS verbal subscore is 5. GCS motor subscore is 6.  Skin: Skin is warm and dry. No abrasion and no rash noted.  Psychiatric: He has a normal mood and affect. His speech is normal and behavior is normal.  Nursing note and vitals reviewed.   ED Course  Procedures (including critical care time) Labs Review Labs Reviewed  LIPASE, BLOOD  COMPREHENSIVE METABOLIC PANEL  CBC  URINALYSIS, ROUTINE W REFLEX MICROSCOPIC (NOT AT [facility])    Imaging Review No results found. I have personally reviewed and evaluated these images and lab results as part of my medical decision-making.   EKG Interpretation None      MDM   Final diagnoses:  None    Patient given IV fluids here and will check a urinalysis once that is obtained. If patient has excessive white blood cells in his urine  will likely require admission for IV antibiotics. His urine has improved, and he be discharged to continue on his current therapy. He is currently on Macrobid. Care signed out to Dr. Bosie Helper, MD 10/19/15 [phone]

## 2015-10-19 NOTE — ED Notes (Signed)
Unsuccessful In and Out Cath attempt by this RN and Lilia Pro, RN; other staff made aware

## 2015-10-19 NOTE — ED Notes (Signed)
Pt BIB EMS from [facility]; pt woke up this AM with left sided upper and lower abdominal pain; pain radiates to lower back; pt states this pain feels similar to pain he has had in the past with kidney infections; pt reports dysuria and foul odor to urine; pt denies N/V; pt

## 2015-10-19 NOTE — Progress Notes (Signed)
Entered in d/c instructions  You have been given a list of [place] resources to include the number for [employer], shelters, financial resources, DSS, social security office, etc Call on 10/22/2015 Please call a Housing staff member to discuss your concern with new apartment they placed you in beginning on Tuesday October 16 2015  There's not a staff member who works for [place] present at [employer] to assist with housing Housing Authorities: [place]: [phone] Call on 10/22/2015 Housing Authorities: [place]: [phone]

## 2015-10-19 NOTE — Discharge Instructions (Signed)

## 2015-10-21 ENCOUNTER — Emergency Department (HOSPITAL_COMMUNITY)
Admission: EM | Admit: 2015-10-21 | Discharge: 2015-10-22 | Disposition: A | Discharge status: Home / Self Care | Payer: Medicare Other | Attending: Emergency Medicine | Admitting: Emergency Medicine

## 2015-10-21 ENCOUNTER — Encounter (HOSPITAL_COMMUNITY): Payer: Self-pay | Admitting: Emergency Medicine

## 2015-10-21 DIAGNOSIS — Y92002 Bathroom of unspecified non-institutional (private) residence single-family (private) house as the place of occurrence of the external cause: Secondary | ICD-10-CM | POA: Insufficient documentation

## 2015-10-21 DIAGNOSIS — I10 Essential (primary) hypertension: Secondary | ICD-10-CM | POA: Insufficient documentation

## 2015-10-21 DIAGNOSIS — G479 Sleep disorder, unspecified: Secondary | ICD-10-CM | POA: Diagnosis not present

## 2015-10-21 DIAGNOSIS — Z87438 Personal history of other diseases of male genital organs: Secondary | ICD-10-CM | POA: Diagnosis not present

## 2015-10-21 DIAGNOSIS — W19XXXA Unspecified fall, initial encounter: Secondary | ICD-10-CM

## 2015-10-21 DIAGNOSIS — Z862 Personal history of diseases of the blood and blood-forming organs and certain disorders involving the immune mechanism: Secondary | ICD-10-CM | POA: Diagnosis not present

## 2015-10-21 DIAGNOSIS — Z87891 Personal history of nicotine dependence: Secondary | ICD-10-CM | POA: Insufficient documentation

## 2015-10-21 DIAGNOSIS — L89152 Pressure ulcer of sacral region, stage 2: Secondary | ICD-10-CM | POA: Diagnosis not present

## 2015-10-21 DIAGNOSIS — Z792 Long term (current) use of antibiotics: Secondary | ICD-10-CM | POA: Diagnosis not present

## 2015-10-21 DIAGNOSIS — L8990 Pressure ulcer of unspecified site, unspecified stage: Secondary | ICD-10-CM

## 2015-10-21 DIAGNOSIS — W050XXA Fall from non-moving wheelchair, initial encounter: Secondary | ICD-10-CM | POA: Diagnosis not present

## 2015-10-21 DIAGNOSIS — Y9389 Activity, other specified: Secondary | ICD-10-CM | POA: Diagnosis not present

## 2015-10-21 DIAGNOSIS — S0993XA Unspecified injury of face, initial encounter: Secondary | ICD-10-CM | POA: Diagnosis not present

## 2015-10-21 DIAGNOSIS — Y999 Unspecified external cause status: Secondary | ICD-10-CM | POA: Diagnosis not present

## 2015-10-21 DIAGNOSIS — K625 Hemorrhage of anus and rectum: Secondary | ICD-10-CM | POA: Diagnosis not present

## 2015-10-21 DIAGNOSIS — Z87442 Personal history of urinary calculi: Secondary | ICD-10-CM | POA: Insufficient documentation

## 2015-10-21 DIAGNOSIS — Z87440 Personal history of urinary (tract) infections: Secondary | ICD-10-CM | POA: Diagnosis not present

## 2015-10-21 DIAGNOSIS — E785 Hyperlipidemia, unspecified: Secondary | ICD-10-CM | POA: Insufficient documentation

## 2015-10-21 DIAGNOSIS — S3660XA Unspecified injury of rectum, initial encounter: Secondary | ICD-10-CM | POA: Diagnosis not present

## 2015-10-21 DIAGNOSIS — Z79899 Other long term (current) drug therapy: Secondary | ICD-10-CM | POA: Insufficient documentation

## 2015-10-21 LAB — URINE CULTURE
Culture: >=100000
Special Requests: NORMAL

## 2015-10-21 NOTE — ED Provider Notes (Signed)
CSN: [id number]     Arrival date & time 10/21/15  2229 History  By signing my name below, I, Patrick Orozco, attest that this documentation has been prepared under the direction and in the presence of Patrick Biles, MD. Electronically Signed: Altamease Orozco, ED Scribe. 10/22/2015. 12:41 AM   Chief Complaint  Patient presents with  . Fall    The history is provided by the patient. No language interpreter was used.   Brought in by EMS, Patrick Orozco is a 59 y.o. male with history of spina bifida who presents to the Emergency Department complaining of a fall 2 hours ago. Pt was in the bathroom transferring from his wheelchair to the toilet when he fell. He struck his chin on the sink but denies LOC. When he stood he noted bright red blood on the floor and the tub. Pt states that the blood is coming from his rectum. He has no history of hematochezia or hemorrhoids. Prior to today his stools have been brown. Pt denies other injury or related complaint.   Pt states that he feels unsafe at his current independent living facility and  Has subsequently been unable to sleep. He feels unsafe because he was moved the the current resident due to renovation at his old place - and not due to direct harm. He feels that the current place is not very easy to get around for handicap people.  Past Medical History  Diagnosis Date  . Spina bifida   . Hypertension   . Hyperlipidemia   . Kidney stones   . UTI (lower urinary tract infection)   . Depression     wife passed July 2013  . Shortness of breath     occassionally  . GERD (gastroesophageal reflux disease)   . Kidney stones   . Colostomy in place [facility])   . Neurogenic bladder     uses condom cath  . Sarcoidosis ([place])   . Sleep apnea     pt denies sleep apnea pt states never followed through with sleep study; pt scored 5 per stop bang tool per PAT visit 07/17/2015; results sent to PCP Dr Ayesha Rumpf   Past Surgical History  Procedure Laterality Date   . Spine surgery      multiple surgeries related to spina bifida  . Colostomy    . 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 Endobronchial ultrasound Bilateral 04/25/2013    Procedure: ENDOBRONCHIAL ULTRASOUND;  Surgeon: Collene Gobble, MD;  Location: [facility];  Service: Cardiopulmonary;  Laterality: Bilateral;  . Cystoscopy w/ ureteral stent placement Left 06/15/2015    Procedure: CYSTOSCOPY WITH STENT REPLACEMENT;  Surgeon: Raynelle Bring, MD;  Location: [facility];  Service: Urology;  Laterality: Left;  . Cystoscopy/ureteroscopy/holmium laser/stent placement Left 07/18/2015    Procedure: 1ST STAGE CYSTOSCOPY/URETEROSCOPY/HOLMIUM LASER/STENT REPLACEMENT;  Surgeon: Alexis Frock, MD;  Location: [facility];  Service: Urology;  Laterality: Left;  . Cystoscopy/ureteroscopy/holmium laser/stent placement Left 07/20/2015    Procedure: 2ND STAGE CYSTOSCOPY LEFT URETEROSCOPY STENT REPLACEMENT left retrograde;  Surgeon: Alexis Frock, MD;  Location: [facility];  Service: Urology;  Laterality: Left;   Family History  Problem Relation Age of Onset  . Cancer Mother     ?lung  . Cancer Maternal Grandfather     lung   Social History  Substance Use Topics  . Smoking status: Former Smoker -- 0.25 packs/day for 22 years    Types: Cigarettes    Quit date: 11/03/1992  .  Smokeless tobacco: Never Used  . Alcohol Use: No    Review of Systems  Gastrointestinal: Positive for anal bleeding.  Musculoskeletal: Negative for back pain, arthralgias and neck pain.  Hematological: Does not bruise/bleed easily.  Psychiatric/Behavioral: Positive for sleep disturbance.    Allergies  Ivp dye  Home Medications   Prior to Admission medications   Medication Sig Start Date End Date Taking? Authorizing Provider  metoprolol (TOPROL-XL) 50 MG 24 hr tablet Take 50 mg by mouth daily.    Yes Historical Provider, MD  simvastatin (ZOCOR) 20 MG tablet Take 20 mg by mouth at bedtime.    Yes Historical Provider, MD  cephALEXin  (KEFLEX) 500 MG capsule Take 1 capsule (500 mg total) by mouth 4 (four) times daily. 10/19/15   Daleen Bo, MD  docusate sodium (COLACE) 100 MG capsule Take 1 capsule (100 mg total) by mouth every 12 (twelve) hours. 10/22/15   Patrick Biles, MD  Fluticasone-Salmeterol (ADVAIR) 250-50 MCG/DOSE AEPB Inhale 1 puff into the lungs 2 (two) times daily as needed (shortness of breath).     Historical Provider, MD   BP 113/74 mmHg  Pulse 68  Temp(Src) 97.5 F (36.4 C)  Resp 18  Ht '5\' 4"'$  (1.626 m)  Wt 180 lb (81.647 kg)  BMI 30.88 kg/m2  SpO2 97% Physical Exam  Constitutional: He is oriented to person, place, and time. He appears well-developed and well-nourished.  HENT:  Head: Normocephalic and atraumatic.  Eyes: EOM are normal.  Neck: Normal range of motion.  Cardiovascular: Normal rate, regular rhythm, normal heart sounds and intact distal pulses.   Pulmonary/Chest: Effort normal and breath sounds normal. No respiratory distress.  CTAB  Abdominal: Soft. He exhibits no distension. There is no tenderness.  Ostomy site appears clean.   Genitourinary:  5X5 cm stage 2 pressure ulcer in the sacrum that is perirectal. Another 3 cm adjacent lesion that is also stage 2.   Musculoskeletal:  No gross deformity or TTP at the BUEs  Neurological: He is alert and oriented to person, place, and time.  Skin: Skin is warm and dry.  Psychiatric: He has a normal mood and affect. Judgment normal.  Nursing note and vitals reviewed.   ED Course  Procedures (including critical care time) DIAGNOSTIC STUDIES: Oxygen Saturation is 97% on RA,  normal by my interpretation.    COORDINATION OF CARE: 11:24 PM Discussed treatment plan which includes lab work with pt at bedside and pt agreed to plan.  Labs Review Labs Reviewed  BASIC METABOLIC PANEL - Abnormal; Notable for the following:    Chloride 112 (*)    CO2 20 (*)    All other components within normal limits  CBC WITH DIFFERENTIAL/PLATELET   PROTIME-INR  POC OCCULT BLOOD, ED    Imaging Review No results found. I have personally reviewed and evaluated these lab results as part of my medical decision-making.   EKG Interpretation None      MDM   Final diagnoses:  Fall, initial encounter  Pressure ulcer    I personally performed the services described in this documentation, which was scribed in my presence. The recorded information has been reviewed and is accurate.   Pt comes in post fall and bloody stools. From the fall,  which occurred around 9 pm, pt has no headaches, no body aches. His main complains is that he doesn't want to stay at the place he is temporarily assigned and the bloody stools. On our exam - he has a large pressure  ulcer that is perirectal. He has no melena, no BRBPR, and the stools are guaic neg. Pt also has no hx of GI bleed. Hb is stable. We will watch him in the ER for 4 hours -if there is repeat episodes of bloody stools, we will reassess.  Pt also states he doesn't like the place he is at. It is clearly not an Emergency problem. Further, i reviewed his chart, and our Case management just spoke to hinm on 12/16, and asked him to discuss the case further with [facility] -which is completely reasonable request from [facility] side. We will d/c.   Patrick Biles, MD 10/22/15 [phone]

## 2015-10-21 NOTE — ED Notes (Signed)
Brought in by EMS from home.  Had a fall from wheel chair.  Reports hitting head and chin on the sink.  C/O headache.  Also reports having rectal bleeding.  Per EMS noted 30cc's or more of blood on the floor.  Hx of Spina Bifida.  Denies any abdominal pain.

## 2015-10-22 ENCOUNTER — Telehealth (HOSPITAL_BASED_OUTPATIENT_CLINIC_OR_DEPARTMENT_OTHER): Payer: Self-pay | Admitting: Emergency Medicine

## 2015-10-22 DIAGNOSIS — S0993XA Unspecified injury of face, initial encounter: Secondary | ICD-10-CM | POA: Diagnosis not present

## 2015-10-22 LAB — CBC WITH DIFFERENTIAL/PLATELET
Basophils Absolute: 0 10*3/uL (ref 0.0–0.1)
Basophils Relative: 0 %
EOS ABS: 0.2 10*3/uL (ref 0.0–0.7)
Eosinophils Relative: 4 %
HEMATOCRIT: 43.4 % (ref 39.0–52.0)
HEMOGLOBIN: 13.9 g/dL (ref 13.0–17.0)
LYMPHS ABS: 1.1 10*3/uL (ref 0.7–4.0)
Lymphocytes Relative: 18 %
MCH: 30 pg (ref 26.0–34.0)
MCHC: 32 g/dL (ref 30.0–36.0)
MCV: 93.7 fL (ref 78.0–100.0)
MONOS PCT: 9 %
Monocytes Absolute: 0.6 10*3/uL (ref 0.1–1.0)
NEUTROS ABS: 4.3 10*3/uL (ref 1.7–7.7)
NEUTROS PCT: 69 %
Platelets: 180 10*3/uL (ref 150–400)
RBC: 4.63 MIL/uL (ref 4.22–5.81)
RDW: 14 % (ref 11.5–15.5)
WBC: 6.2 10*3/uL (ref 4.0–10.5)

## 2015-10-22 LAB — PROTIME-INR
INR: 1.1 (ref 0.00–1.49)
Prothrombin Time: 14.4 seconds (ref 11.6–15.2)

## 2015-10-22 LAB — BASIC METABOLIC PANEL
Anion gap: 9 (ref 5–15)
BUN: 17 mg/dL (ref 6–20)
CHLORIDE: 112 mmol/L — AB (ref 101–111)
CO2: 20 mmol/L — AB (ref 22–32)
CREATININE: 0.96 mg/dL (ref 0.61–1.24)
Calcium: 9.4 mg/dL (ref 8.9–10.3)
GFR calc Af Amer: >60 mL/min (ref >60)
GFR calc non Af Amer: >60 mL/min (ref >60)
Glucose, Bld: 82 mg/dL (ref 65–99)
Potassium: 3.6 mmol/L (ref 3.5–5.1)
Sodium: 141 mmol/L (ref 135–145)

## 2015-10-22 LAB — POC OCCULT BLOOD, ED: FECAL OCCULT BLD: NEGATIVE

## 2015-10-22 MED ORDER — DOCUSATE SODIUM 100 MG PO CAPS: 100.0000 mg | ORAL_CAPSULE | Freq: Two times a day (BID) | ORAL | Status: DC

## 2015-10-22 NOTE — Discharge Instructions (Signed)
We saw you in the ER AFTER you had a fall and with complains of bloody stools. All the results in the ER are normal. The rectal exam reveals no blood in the stool. Blood count is normal. We think the bleeding is from the ulcer you had. Stool softener prescription provided. Please see wound care team for optimal care of the ulcer. The workup in the ER is not complete, and is limited to screening for life threatening and emergent conditions only, so please see a primary care doctor for further evaluation.  Return to the ER if you have massive bleeding.   Skin Ulcer A skin ulcer is an open sore that can be shallow or deep. Skin ulcers sometimes become infected and are difficult to treat. It may be 1 month or longer before real healing progress is made. CAUSES   Injury.  Problems with the veins or arteries.  Diabetes.  Insect bites.  Bedsores.  Inflammatory conditions. SYMPTOMS   Pain, redness, swelling, and tenderness around the ulcer.  Fever.  Bleeding from the ulcer.  Yellow or clear fluid coming from the ulcer. DIAGNOSIS  There are many types of skin ulcers. Any open sores will be examined. Certain tests will be done to determine the kind of ulcer you have. The right treatment depends on the type of ulcer you have. TREATMENT  Treatment is a long-term challenge. It may include:  Wearing an elastic wrap, compression stockings, or gel cast over the ulcer area.  Taking antibiotic medicines or putting antibiotic creams on the affected area if there is an infection. HOME CARE INSTRUCTIONS  Put on your bandages (dressings), wraps, or casts over the ulcer as directed by your caregiver.  Change all dressings as directed by your caregiver.  Take all medicines as directed by your caregiver.  Keep the affected area clean and dry.  Avoid injuries to the affected area.  Eat a well-balanced, healthy diet that includes plenty of fruit and vegetables.  If you smoke, consider  quitting or decreasing the amount of cigarettes you smoke.  Once the ulcer heals, get regular exercise as directed by your caregiver.  Work with your caregiver to make sure your blood pressure, cholesterol, and diabetes are well-controlled.  Keep your skin moisturized. Dry skin can crack and lead to skin ulcers. SEEK IMMEDIATE MEDICAL CARE IF:   Your pain gets worse.  You have swelling, redness, or fluids around the ulcer.  You have chills.  You have a fever. MAKE SURE YOU:   Understand these instructions.  Will watch your condition.  Will get help right away if you are not doing well or get worse.   This information is not intended to replace advice given to you by your health care provider. Make sure you discuss any questions you have with your health care provider.   Document Released: 11/27/2004 Document Revised: 01/12/2012 Document Reviewed: 06/06/2011 Elsevier Interactive Patient Education [employer].

## 2015-10-22 NOTE — ED Notes (Signed)
Pt given sandwich and apple juice

## 2015-10-22 NOTE — ED Notes (Signed)
Pt expressing concerns of returning to [facility]. Pt reports the apartment he's in is not wheelchair accessible. Pt has had follow up by Education officer, museum and is to call [employer] today per previous discharge instructions. Dr.Nanavati aware of pt's concerns and plan to discharge via [facility]

## 2015-10-22 NOTE — ED Notes (Signed)
[facility] unable to transport patient due to patient's wheelchair at bedside.

## 2015-10-22 NOTE — ED Notes (Signed)
[facility] called for transport per patient request

## 2015-10-22 NOTE — ED Notes (Signed)
[facility] reports they are unable to transport this patient d/t wheelchair and pt is not a [place] patient.

## 2015-10-22 NOTE — Telephone Encounter (Signed)
Post ED Visit - Positive Culture Follow-up  Culture report reviewed by antimicrobial stewardship pharmacist:  '[x]'$  Elenor Quinones, Pharm.D. '[]'$  Heide Guile, Pharm.D., BCPS '[]'$  Parks Neptune, Pharm.D. '[]'$  Alycia Rossetti, Pharm.D., BCPS '[]'$  [place], [place].D., BCPS, AAHIVP '[]'$  Legrand Como, Pharm.D., BCPS, AAHIVP '[]'$  Milus Glazier, Pharm.D. '[]'$  Stephens November, [place].D.  Positive urine culture E. Coli Treated with cephalexin, organism sensitive to the same and no further patient follow-up is required at this time.  Hazle Nordmann 10/22/2015, 10:00 AM

## 2015-10-22 NOTE — ED Notes (Signed)
Contacted [facility] for transport. There's no vehicle available for wheelchair transportation until after 8am

## 2015-12-25 ENCOUNTER — Ambulatory Visit: Payer: Medicare Other | Attending: Family Medicine | Admitting: Physical Therapy

## 2015-12-25 DIAGNOSIS — M6281 Muscle weakness (generalized): Secondary | ICD-10-CM | POA: Insufficient documentation

## 2015-12-25 NOTE — Therapy (Signed)
[facility] [address] [place], [place], [address] Phone: [phone]   Fax:  [phone]  Physical Therapy Evaluation  Patient Details  Name: Patrick Orozco MRN: [id number] Date of Birth: 01/15/1956 No Data Recorded  Encounter Date: 12/25/2015    Past Medical History  Diagnosis Date  . Spina bifida   . Hypertension   . Hyperlipidemia   . Kidney stones   . UTI (lower urinary tract infection)   . Depression     wife passed July 2013  . Shortness of breath     occassionally  . GERD (gastroesophageal reflux disease)   . Kidney stones   . Colostomy in place [facility])   . Neurogenic bladder     uses condom cath  . Sarcoidosis ([place])   . Sleep apnea     pt denies sleep apnea pt states never followed through with sleep study; pt scored 5 per stop bang tool per PAT visit 07/17/2015; results sent to PCP Dr Ayesha Rumpf    Past Surgical History  Procedure Laterality Date  . Spine surgery      multiple surgeries related to spina bifida  . Colostomy    . 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 Endobronchial ultrasound Bilateral 04/25/2013    Procedure: ENDOBRONCHIAL ULTRASOUND;  Surgeon: Collene Gobble, MD;  [facility];  Service: Cardiopulmonary;  Laterality: Bilateral;  . Cystoscopy w/ ureteral stent placement Left 06/15/2015    Procedure: CYSTOSCOPY WITH STENT REPLACEMENT;  Surgeon: Raynelle Bring, MD;  [facility];  Service: Urology;  Laterality: Left;  . Cystoscopy/ureteroscopy/holmium laser/stent placement Left 07/18/2015    Procedure: 1ST STAGE CYSTOSCOPY/URETEROSCOPY/HOLMIUM LASER/STENT REPLACEMENT;  Surgeon: Alexis Frock, MD;  Location: [facility];  Service: Urology;  Laterality: Left;  . Cystoscopy/ureteroscopy/holmium laser/stent placement Left 07/20/2015    Procedure: 2ND STAGE CYSTOSCOPY LEFT URETEROSCOPY STENT REPLACEMENT left retrograde;  Surgeon: Alexis Frock, MD;  [facility];  Service:  Urology;  Laterality: Left;    There were no vitals filed for this visit.  Visit Diagnosis:  Generalized muscle weakness       Pt arrived for re-evaluation for power wheelchair - accurate weight needed due to his weight recorded as 288# in Feb. 2016 at time of wheelchair evaluation.  Due to his weight being 3# above the 15# +/- interval for 300# he may need a different power wheelchair than the one originally spec'd out for him --the scales at [facility] are currently broken and accurate weight was unable to be recorded - no charge for today's session.                                   Problem List Patient Active Problem List   Diagnosis Date Noted  . Pressure ulcer 07/19/2015  . [place] kidney stones 07/18/2015  . Ureteral stone with hydronephrosis 06/15/2015  . OSA (obstructive sleep apnea) 07/13/2014  . UTI (urinary tract infection), bacterial 2/2 GNR's 01/07/2013  . Cardiac enzymes elevated 01/07/2013  . Atypical chest pain 01/07/2013  . H/O paraplegia 01/07/2013  . Recurrent kidney stones-non obstructive 01/07/2013  . Sarcoidosis ([place]) 01/07/2013  . Nausea and vomiting 01/05/2013  . Acute pyelonephritis 01/05/2013  . Left flank pain 01/05/2013  . Thrombocytopenia ([place]) 01/05/2013  . Hypertension 01/05/2013  . History of spina bifida 01/05/2013    DildayJenness Corner, PT 12/25/2015, 3:02 PM  [facility]  [facility] [place], [place], [address] Phone: [phone]   Fax:  [phone]  Name: MASSAI HANKERSON MRN: [id number] Date of Birth: April 19, 1956

## 2016-05-30 ENCOUNTER — Other Ambulatory Visit: Payer: Self-pay

## 2016-05-30 ENCOUNTER — Emergency Department (HOSPITAL_BASED_OUTPATIENT_CLINIC_OR_DEPARTMENT_OTHER)
Admit: 2016-05-30 | Discharge: 2016-05-30 | Disposition: A | Discharge status: Home / Self Care | Payer: Medicare Other | Attending: Emergency Medicine | Admitting: Emergency Medicine

## 2016-05-30 ENCOUNTER — Emergency Department (HOSPITAL_COMMUNITY): Payer: Medicare Other

## 2016-05-30 ENCOUNTER — Encounter (HOSPITAL_COMMUNITY): Payer: Self-pay | Admitting: Emergency Medicine

## 2016-05-30 ENCOUNTER — Emergency Department (HOSPITAL_COMMUNITY)
Admission: EM | Admit: 2016-05-30 | Discharge: 2016-05-30 | Disposition: A | Discharge status: Home / Self Care | Payer: Medicare Other | Attending: Emergency Medicine | Admitting: Emergency Medicine

## 2016-05-30 DIAGNOSIS — I10 Essential (primary) hypertension: Secondary | ICD-10-CM | POA: Insufficient documentation

## 2016-05-30 DIAGNOSIS — E785 Hyperlipidemia, unspecified: Secondary | ICD-10-CM | POA: Diagnosis not present

## 2016-05-30 DIAGNOSIS — L89891 Pressure ulcer of other site, stage 1: Secondary | ICD-10-CM

## 2016-05-30 DIAGNOSIS — Z79899 Other long term (current) drug therapy: Secondary | ICD-10-CM | POA: Diagnosis not present

## 2016-05-30 DIAGNOSIS — R609 Edema, unspecified: Secondary | ICD-10-CM | POA: Diagnosis not present

## 2016-05-30 DIAGNOSIS — R110 Nausea: Secondary | ICD-10-CM | POA: Diagnosis present

## 2016-05-30 DIAGNOSIS — M7989 Other specified soft tissue disorders: Secondary | ICD-10-CM

## 2016-05-30 DIAGNOSIS — Z87891 Personal history of nicotine dependence: Secondary | ICD-10-CM | POA: Diagnosis not present

## 2016-05-30 LAB — CBC WITH DIFFERENTIAL/PLATELET
BASOS ABS: 0 10*3/uL (ref 0.0–0.1)
BASOS PCT: 0 %
EOS ABS: 0.3 10*3/uL (ref 0.0–0.7)
EOS PCT: 5 %
HCT: 40.1 % (ref 39.0–52.0)
Hemoglobin: 13.2 g/dL (ref 13.0–17.0)
Lymphocytes Relative: 20 %
Lymphs Abs: 1.1 10*3/uL (ref 0.7–4.0)
MCH: 30.5 pg (ref 26.0–34.0)
MCHC: 32.9 g/dL (ref 30.0–36.0)
MCV: 92.6 fL (ref 78.0–100.0)
Monocytes Absolute: 0.4 10*3/uL (ref 0.1–1.0)
Monocytes Relative: 8 %
NEUTROS PCT: 67 %
Neutro Abs: 3.7 10*3/uL (ref 1.7–7.7)
PLATELETS: 153 10*3/uL (ref 150–400)
RBC: 4.33 MIL/uL (ref 4.22–5.81)
RDW: 13.5 % (ref 11.5–15.5)
WBC: 5.5 10*3/uL (ref 4.0–10.5)

## 2016-05-30 LAB — BASIC METABOLIC PANEL
Anion gap: 6 (ref 5–15)
BUN: 14 mg/dL (ref 6–20)
CALCIUM: 9 mg/dL (ref 8.9–10.3)
CO2: 24 mmol/L (ref 22–32)
CREATININE: 0.86 mg/dL (ref 0.61–1.24)
Chloride: 111 mmol/L (ref 101–111)
Glucose, Bld: 96 mg/dL (ref 65–99)
Potassium: 3.8 mmol/L (ref 3.5–5.1)
SODIUM: 141 mmol/L (ref 135–145)

## 2016-05-30 MED ORDER — ONDANSETRON 4 MG PO TBDP: 4.0000 mg | ORAL_TABLET | Freq: Three times a day (TID) | ORAL | 0 refills | Status: DC | PRN

## 2016-05-30 NOTE — ED Notes (Addendum)
Per EMS, patient from home. Complaining of Nausea beginning this AM.  Lower left leg swelling x 6 months.  Has gotten worse in the last few days.  Dried skin.  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  EMS gave patient 4 mg of zofran and he said his nausea is better.  20 G in Left hand.  EKG unremarkable.  Lung sounds clear.  Abdomen sounds good.  No SOB. History of high BP and High Cholesterol.  BP 124/84, 64 HR, 96% RA, 134 CBG.

## 2016-05-30 NOTE — ED Notes (Signed)
Attempt to draw blood to L AC unsuccessful. Eulas Post reports will attempt blood draw.

## 2016-05-30 NOTE — ED Notes (Signed)
RN getting labs

## 2016-05-30 NOTE — ED Notes (Signed)
Patient presents with colostomy without collection bag. Patient states that he has been using toilet around site since he has been out bags for five days. New collection container applied. Patient presents with a grossly contaminated condom catheter with leg bag. New condom catheter and leg bag applied.

## 2016-05-30 NOTE — ED Provider Notes (Signed)
[facility] DEPT Provider Note   CSN: [id number] Arrival date & time: 05/30/16  1212  First Provider Contact:  First MD Initiated Contact with Patient 05/30/16 1319        History   Chief Complaint Chief Complaint  Patient presents with  . Nausea  . Leg Swelling    HPI Patrick Orozco is a 60 y.o. male presents with a chief complaint of nausea. 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 States when he woke up this morning he felt nauseated but did not throw up. Due to continued nausea he called EMS. After IV anti-emetic he feels that his nausea is gone. No abdominal pain. Has a colostomy and has not noticed any increased or change in his stool output. Feels himself urinate and has had no dysuria or change in urination. No chest pain, shortness of breath, or headaches. Is also concerned about his left foot. His left lower cavity has been swollen more than the right for about 6 months. He tells me there has been no workup for this so far including no ultrasound. His left foot has been having dried skin and peeling skin for about one week. He has noticed a wound on his heel.  HPI  Past Medical History:  Diagnosis Date  . Colostomy in place [facility])   . Depression    wife passed July 2013  . GERD (gastroesophageal reflux disease)   . Hyperlipidemia   . Hypertension   . Kidney stones   . Kidney stones   . Neurogenic bladder    uses condom cath  . Sarcoidosis ([place])   . Shortness of breath    occassionally  . Sleep apnea    pt denies sleep apnea pt states never followed through with sleep study; pt scored 5 per stop bang tool per PAT visit 07/17/2015; results sent to PCP Dr Ayesha Rumpf  . Spina bifida   . UTI (lower urinary tract infection)     Patient Active Problem List   Diagnosis Date Noted  . Pressure ulcer 07/19/2015  . Staghorn kidney stones 07/18/2015  . Ureteral stone with hydronephrosis 06/15/2015  . OSA (obstructive sleep apnea)  07/13/2014  . UTI (urinary tract infection), bacterial 2/2 GNR's 01/07/2013  . Cardiac enzymes elevated 01/07/2013  . Atypical chest pain 01/07/2013  . H/O paraplegia 01/07/2013  . Recurrent kidney stones-non obstructive 01/07/2013  . Sarcoidosis ([place]) 01/07/2013  . Nausea and vomiting 01/05/2013  . Acute pyelonephritis 01/05/2013  . Left flank pain 01/05/2013  . Thrombocytopenia ([place]) 01/05/2013  . Hypertension 01/05/2013  . History of spina bifida 01/05/2013    Past Surgical History:  Procedure Laterality Date  . COLOSTOMY    . CYSTOSCOPY W/ URETERAL STENT PLACEMENT Left 06/15/2015   Procedure: CYSTOSCOPY WITH STENT REPLACEMENT;  Surgeon: Raynelle Bring, MD;  [facility]: [facility];  Service: Urology;  Laterality: Left;  . CYSTOSCOPY/URETEROSCOPY/HOLMIUM LASER/STENT PLACEMENT Left 07/18/2015   Procedure: 1ST STAGE CYSTOSCOPY/URETEROSCOPY/HOLMIUM LASER/STENT REPLACEMENT;  Surgeon: Alexis Frock, MD;  [facility];  Service: Urology;  Laterality: Left;  . CYSTOSCOPY/URETEROSCOPY/HOLMIUM LASER/STENT PLACEMENT Left 07/20/2015   Procedure: 2ND STAGE CYSTOSCOPY LEFT URETEROSCOPY STENT REPLACEMENT left retrograde;  Surgeon: Alexis Frock, MD;  [facility];  Service: Urology;  Laterality: Left;  . ENDOBRONCHIAL ULTRASOUND Bilateral 04/25/2013   Procedure: ENDOBRONCHIAL ULTRASOUND;  Surgeon: Collene Gobble, MD;  [facility];  Service: Cardiopulmonary;  Laterality: Bilateral;  . SPINE SURGERY     multiple surgeries related to spina  bifida  . TOE AMPUTATION  2010   toe on left foot       Home Medications    Prior to Admission medications   Medication Sig Start Date End Date Taking? Authorizing Provider  Fluticasone-Salmeterol (ADVAIR) 250-50 MCG/DOSE AEPB Inhale 1 puff into the lungs 2 (two) times daily as needed (shortness of breath).    Yes Historical Provider, MD  metoprolol (TOPROL-XL) 50 MG 24 hr tablet Take 50 mg by mouth daily.    Yes Historical Provider, MD    simvastatin (ZOCOR) 20 MG tablet Take 20 mg by mouth at bedtime.    Yes Historical Provider, MD  docusate sodium (COLACE) 100 MG capsule Take 1 capsule (100 mg total) by mouth every 12 (twelve) hours. Patient not taking: Reported on 05/30/2016 10/22/15   [name], MD  ondansetron (ZOFRAN ODT) 4 MG disintegrating tablet Take 1 tablet (4 mg total) by mouth every 8 (eight) hours as needed for nausea or vomiting. 05/30/16   [name], MD    Family History Family History  Problem Relation Age of Onset  . Cancer Mother     ?lung  . Cancer Maternal Grandfather     lung    Social History Social History  Substance Use Topics  . Smoking status: Former Smoker    Packs/day: 0.25    Years: 22.00    Types: Cigarettes    Quit date: 11/03/1992  . Smokeless tobacco: Never Used  . Alcohol use No     Allergies   Ivp dye [iodinated diagnostic agents]   Review of Systems Review of Systems  Constitutional: Negative for fever.  Respiratory: Negative for shortness of breath.   Cardiovascular: Negative for chest pain.  Gastrointestinal: Positive for nausea. Negative for abdominal pain, blood in stool, diarrhea and vomiting.  Genitourinary: Negative for difficulty urinating, dysuria and frequency.  Neurological: Negative for dizziness and headaches.  All other systems reviewed and are negative.    Physical Exam Updated Vital Signs BP 143/85 (BP Location: Right Arm)   Pulse 60   Temp 97.5 F (36.4 C) (Oral)   Resp 20   Ht '5\' 5"'$  (1.651 m)   Wt 230 lb (104.3 kg)   SpO2 98%   BMI 38.27 kg/m   Physical Exam  Constitutional: He is oriented to person, place, and time. He appears well-developed and well-nourished.  HENT:  Head: Normocephalic and atraumatic.  Right Ear: External ear normal.  Left Ear: External ear normal.  Nose: Nose normal.  Eyes: Right eye exhibits no discharge. Left eye exhibits no discharge.  Neck: Neck supple.  Cardiovascular: Normal rate, regular rhythm,  normal heart sounds and intact distal pulses.   Pulmonary/Chest: Effort normal and breath sounds normal.  Abdominal: Soft. There is no tenderness.  Musculoskeletal:  LLE is swollen symmetrically compared to right. Left plantar foot with dried skin but no desquamation. ~4 cm circular superficial wound near heel. Is pink, no drainage. No cellulitis.  Neurological: He is alert and oriented to person, place, and time.  Chronic lower extremity paralysis. Upper strength intact. No sensation to lower extremities  Skin: Skin is warm and dry.  Nursing note and vitals reviewed.    ED Treatments / Results  Labs (all labs ordered are listed, but only abnormal results are displayed) Labs Reviewed  BASIC METABOLIC PANEL  CBC WITH DIFFERENTIAL/PLATELET    EKG  EKG Interpretation  Date/Time:  Friday May 30 2016 15:11:48 EDT Ventricular Rate:  66 PR Interval:    QRS Duration:  83 QT Interval:  426 QTC Calculation: 447 R Axis:   71 Text Interpretation:  Sinus rhythm Normal ECG no significant change since Sept 2016 Confirmed by Regenia Skeeter MD, Aslin Farinas [id number]) on 05/30/2016 3:13:47 PM       Radiology [facility]    '[]'$ Hide copied text '[]'$ Hover for attribution information *PRELIMINARY RESULTS* Vascular Ultrasound Left lower extremity venous duplex has been completed.  Preliminary findings: No obvious evidence of DVT, although not all veins clearly visualized.  Landry Mellow, RDMS, RVT  05/30/2016, 2:37 PM      Dg Foot Complete Left  Result Date: 05/30/2016 CLINICAL DATA:  Left heel wound for 2 months with skin healing and flaking. EXAM: LEFT FOOT - COMPLETE 3+ VIEW COMPARISON:  None. FINDINGS: Soft tissues of the left foot and lower leg are diffusely are swollen. No soft tissue gas collection or radiopaque foreign body is seen. No bony destructive change or periosteal reaction is identified. Bones are osteopenic with extensive neuropathic/ degenerative change about the hindfoot  and midfoot. The patient is status post amputation of the little toe. IMPRESSION: Diffuse soft tissue swelling without acute bony or joint abnormality. Electronically Signed   By: Inge Rise M.D.   On: 05/30/2016 15:13   Procedures Procedures (including critical care time)  Medications Ordered in ED Medications - No data to display   Initial Impression / Assessment and Plan / ED Course  I have reviewed the triage vital signs and the nursing notes.  Pertinent labs & imaging results that were available during my care of the patient were reviewed by me and considered in my medical decision making (see chart for details).  Clinical Course  Comment By Time  Patient states he's had no prior workup on LLE, will get venous ultrasound although DVT less likely given chronicity. Given lack of sensation, check Xray in low suspicion for osteo. Unclear why he was nauseated but will check labs and ECG. [name], MD 07/28 1333  Patient remains well. No further nausea and no vomiting. Workup is unremarkable with normal white blood cell count and no DVT on ultrasound. They could not see all the veins but I have low suspicion given the chronicity. His wound on his foot appears to be a superficial pressure ulcer, discussed ways to help relieve the pressure on his foot and at this time it does not appear infected. Follow-up with his PCP. [name], MD 07/28 1535     Final Clinical Impressions(s) / ED Diagnoses   Final diagnoses:  Nausea in adult  Left leg swelling  Pressure ulcer of foot, stage I    New Prescriptions New Prescriptions   ONDANSETRON (ZOFRAN ODT) 4 MG DISINTEGRATING TABLET    Take 1 tablet (4 mg total) by mouth every 8 (eight) hours as needed for nausea or vomiting.     [name], MD 05/30/16 1537

## 2016-05-30 NOTE — ED Notes (Signed)
Dr. Regenia Skeeter bedside with patient.

## 2016-05-30 NOTE — ED Notes (Signed)
Tried to draw blood off IV and on right AC..both attempts were unsuccessful.

## 2016-05-30 NOTE — ED Triage Notes (Signed)
Pt complaining of nausea upon awakening this morning. No emesis. No fever.  Swelling of lower left leg beginning approximately 6 months ago. Swelling has gotten worse. Has always had more swelling in left leg than right leg.

## 2016-05-30 NOTE — Progress Notes (Signed)
*  PRELIMINARY RESULTS* Vascular Ultrasound Left lower extremity venous duplex has been completed.  Preliminary findings: No obvious evidence of DVT, although not all veins clearly visualized.  Landry Mellow, RDMS, RVT  05/30/2016, 2:37 PM

## 2016-05-30 NOTE — ED Notes (Signed)
I attempted to collect labs and was unsuccessful. 

## 2016-07-02 ENCOUNTER — Encounter (HOSPITAL_COMMUNITY): Payer: Self-pay | Admitting: Emergency Medicine

## 2016-07-02 ENCOUNTER — Emergency Department (HOSPITAL_COMMUNITY)
Admission: EM | Admit: 2016-07-02 | Discharge: 2016-07-02 | Disposition: A | Discharge status: Home / Self Care | Payer: Medicare Other | Attending: Emergency Medicine | Admitting: Emergency Medicine

## 2016-07-02 DIAGNOSIS — L89620 Pressure ulcer of left heel, unstageable: Secondary | ICD-10-CM | POA: Diagnosis not present

## 2016-07-02 DIAGNOSIS — L89520 Pressure ulcer of left ankle, unstageable: Secondary | ICD-10-CM | POA: Insufficient documentation

## 2016-07-02 DIAGNOSIS — Z87891 Personal history of nicotine dependence: Secondary | ICD-10-CM | POA: Diagnosis not present

## 2016-07-02 DIAGNOSIS — L89220 Pressure ulcer of left hip, unstageable: Secondary | ICD-10-CM | POA: Diagnosis not present

## 2016-07-02 DIAGNOSIS — Q059 Spina bifida, unspecified: Secondary | ICD-10-CM

## 2016-07-02 DIAGNOSIS — M7989 Other specified soft tissue disorders: Secondary | ICD-10-CM | POA: Diagnosis present

## 2016-07-02 DIAGNOSIS — Z79899 Other long term (current) drug therapy: Secondary | ICD-10-CM | POA: Insufficient documentation

## 2016-07-02 DIAGNOSIS — L8990 Pressure ulcer of unspecified site, unspecified stage: Secondary | ICD-10-CM

## 2016-07-02 DIAGNOSIS — I10 Essential (primary) hypertension: Secondary | ICD-10-CM | POA: Diagnosis not present

## 2016-07-02 LAB — COMPREHENSIVE METABOLIC PANEL
ALBUMIN: 3.2 g/dL — AB (ref 3.5–5.0)
ALT: 19 U/L (ref 17–63)
ANION GAP: 5 (ref 5–15)
AST: 26 U/L (ref 15–41)
Alkaline Phosphatase: 77 U/L (ref 38–126)
BILIRUBIN TOTAL: 0.4 mg/dL (ref 0.3–1.2)
BUN: 14 mg/dL (ref 6–20)
CHLORIDE: 112 mmol/L — AB (ref 101–111)
CO2: 23 mmol/L (ref 22–32)
Calcium: 9.3 mg/dL (ref 8.9–10.3)
Creatinine, Ser: 0.9 mg/dL (ref 0.61–1.24)
GFR calc Af Amer: >60 mL/min (ref >60)
GFR calc non Af Amer: >60 mL/min (ref >60)
GLUCOSE: 88 mg/dL (ref 65–99)
POTASSIUM: 3.9 mmol/L (ref 3.5–5.1)
SODIUM: 140 mmol/L (ref 135–145)
TOTAL PROTEIN: 7.2 g/dL (ref 6.5–8.1)

## 2016-07-02 LAB — CBC WITH DIFFERENTIAL/PLATELET
BASOS ABS: 0 10*3/uL (ref 0.0–0.1)
BASOS PCT: 0 %
EOS ABS: 0.2 10*3/uL (ref 0.0–0.7)
Eosinophils Relative: 4 %
HEMATOCRIT: 40.3 % (ref 39.0–52.0)
HEMOGLOBIN: 13.5 g/dL (ref 13.0–17.0)
LYMPHS ABS: 1 10*3/uL (ref 0.7–4.0)
LYMPHS PCT: 15 %
MCH: 31 pg (ref 26.0–34.0)
MCHC: 33.5 g/dL (ref 30.0–36.0)
MCV: 92.4 fL (ref 78.0–100.0)
MONO ABS: 0.6 10*3/uL (ref 0.1–1.0)
Monocytes Relative: 10 %
NEUTROS ABS: 4.6 10*3/uL (ref 1.7–7.7)
Neutrophils Relative %: 71 %
Platelets: 206 10*3/uL (ref 150–400)
RBC: 4.36 MIL/uL (ref 4.22–5.81)
RDW: 13.8 % (ref 11.5–15.5)
WBC: 6.4 10*3/uL (ref 4.0–10.5)

## 2016-07-02 MED ORDER — OXYCODONE-ACETAMINOPHEN 5-325 MG PO TABS: 1.0000 | ORAL_TABLET | Freq: Four times a day (QID) | ORAL | 0 refills | Status: DC | PRN

## 2016-07-02 NOTE — ED Triage Notes (Signed)
Patient presents from home via ems for left leg pain, pressure sores and pain x2 weeks. Bilateral leg pain, left > right.   Last VS: 140 palpated, 86hr, 98%ra

## 2016-07-02 NOTE — ED Triage Notes (Signed)
Patient reports bilateral leg swelling x10 days, non pitting edema noted to same. Also reports pressure sore to posterior left leg x2-3 weeks. Denies SOB, fever, N/V.

## 2016-07-02 NOTE — ED Provider Notes (Signed)
[facility] DEPT Provider Note   CSN: [id number] Arrival date & time: 07/02/16  1632     History   Chief Complaint Chief Complaint  Patient presents with  . Leg Pain    HPI Patrick Orozco is a 60 y.o. male.  The history is provided by the patient.  Leg Pain   Associated symptoms include numbness.  Patient has had some increased swelling in his left lower extremity. He has a history of spina bifida and is not abulatory. Has reported ulcer on left heel and left hip area. Denies fevers and chills. Has some chronic swelling of both legs. States was seen a few weeks ago and had negative ultrasound of that time. Left leg tends to swell more than the right side.  Past Medical History:  Diagnosis Date  . Colostomy in place [name])   . Depression    wife passed July 2013  . GERD (gastroesophageal reflux disease)   . Hyperlipidemia   . Hypertension   . Kidney stones   . Kidney stones   . Neurogenic bladder    uses condom cath  . Sarcoidosis ([place])   . Shortness of breath    occassionally  . Sleep apnea    pt denies sleep apnea pt states never followed through with sleep study; pt scored 5 per stop bang tool per PAT visit 07/17/2015; results sent to PCP Dr Ayesha Rumpf  . Spina bifida   . UTI (lower urinary tract infection)     Patient Active Problem List   Diagnosis Date Noted  . Pressure ulcer 07/19/2015  . Staghorn kidney stones 07/18/2015  . Ureteral stone with hydronephrosis 06/15/2015  . OSA (obstructive sleep apnea) 07/13/2014  . UTI (urinary tract infection), bacterial 2/2 GNR's 01/07/2013  . Cardiac enzymes elevated 01/07/2013  . Atypical chest pain 01/07/2013  . H/O paraplegia 01/07/2013  . Recurrent kidney stones-non obstructive 01/07/2013  . Sarcoidosis ([name]) 01/07/2013  . Nausea and vomiting 01/05/2013  . Acute pyelonephritis 01/05/2013  . Left flank pain 01/05/2013  . Thrombocytopenia ([name]) 01/05/2013  . Hypertension 01/05/2013  . History of spina bifida  01/05/2013    Past Surgical History:  Procedure Laterality Date  . COLOSTOMY    . CYSTOSCOPY W/ URETERAL STENT PLACEMENT Left 06/15/2015   Procedure: CYSTOSCOPY WITH STENT REPLACEMENT;  Surgeon: Raynelle Bring, MD;  [facility];  Service: Urology;  Laterality: Left;  . CYSTOSCOPY/URETEROSCOPY/HOLMIUM LASER/STENT PLACEMENT Left 07/18/2015   Procedure: 1ST STAGE CYSTOSCOPY/URETEROSCOPY/HOLMIUM LASER/STENT REPLACEMENT;  Surgeon: Alexis Frock, MD;  [facility];  Service: Urology;  Laterality: Left;  . CYSTOSCOPY/URETEROSCOPY/HOLMIUM LASER/STENT PLACEMENT Left 07/20/2015   Procedure: 2ND STAGE CYSTOSCOPY LEFT URETEROSCOPY STENT REPLACEMENT left retrograde;  Surgeon: Alexis Frock, MD;  [facility];  Service: Urology;  Laterality: Left;  . ENDOBRONCHIAL ULTRASOUND Bilateral 04/25/2013   Procedure: ENDOBRONCHIAL ULTRASOUND;  Surgeon: Collene Gobble, MD;  [facility];  Service: Cardiopulmonary;  Laterality: Bilateral;  . SPINE SURGERY     multiple surgeries related to spina bifida  . TOE AMPUTATION  2010   toe on left foot       Home Medications    Prior to Admission medications   Medication Sig Start Date End Date Taking? Authorizing Provider  metoprolol (TOPROL-XL) 50 MG 24 hr tablet Take 50 mg by mouth daily.    Yes Historical Provider, MD  simvastatin (ZOCOR) 20 MG tablet Take 20 mg by mouth at bedtime.    Yes Historical Provider, MD  docusate sodium (COLACE) 100 MG capsule  Take 1 capsule (100 mg total) by mouth every 12 (twelve) hours. Patient not taking: Reported on 05/30/2016 10/22/15   [name], MD  Fluticasone-Salmeterol (ADVAIR) 250-50 MCG/DOSE AEPB Inhale 1 puff into the lungs 2 (two) times daily as needed (shortness of breath).     Historical Provider, MD  ondansetron (ZOFRAN ODT) 4 MG disintegrating tablet Take 1 tablet (4 mg total) by mouth every 8 (eight) hours as needed for nausea or vomiting. 05/30/16   [name], MD  oxyCODONE-acetaminophen  (PERCOCET/ROXICET) 5-325 MG tablet Take 1-2 tablets by mouth every 6 (six) hours as needed for severe pain. 07/02/16   [name], MD    Family History Family History  Problem Relation Age of Onset  . Cancer Mother     ?lung  . Cancer Maternal Grandfather     lung    Social History Social History  Substance Use Topics  . Smoking status: Former Smoker    Packs/day: 0.25    Years: 22.00    Types: Cigarettes    Quit date: 11/03/1992  . Smokeless tobacco: Never Used  . Alcohol use No     Allergies   Ivp dye [iodinated diagnostic agents]   Review of Systems Review of Systems  Constitutional: Negative for activity change and appetite change.  Eyes: Negative for pain.  Respiratory: Negative for chest tightness and shortness of breath.   Cardiovascular: Negative for chest pain and leg swelling.  Gastrointestinal: Negative for abdominal pain, diarrhea, nausea and vomiting.  Genitourinary: Negative for flank pain.  Musculoskeletal: Negative for back pain and neck stiffness.  Skin: Positive for wound. Negative for rash.  Neurological: Positive for weakness and numbness. Negative for headaches.  Psychiatric/Behavioral: Negative for behavioral problems.  All other systems reviewed and are negative.    Physical Exam Updated Vital Signs BP 127/77 (BP Location: Right Arm)   Pulse 82   Temp 98.2 F (36.8 C) (Oral)   Resp 18   SpO2 98%   Physical Exam  Constitutional: He appears well-developed.  HENT:  Head: Atraumatic.  Neck: Neck supple.  Cardiovascular: Normal rate.   Pulmonary/Chest: Effort normal.  Abdominal: Soft.  Musculoskeletal: He exhibits edema.  Neurological: He is alert.  Skin: Skin is warm.  Scaling skin with central ulcer on the base of heel of left foot. There are 2 other pressure ulcers on the left thigh and hip area. Both approximately 5 cm. No drainage. Some tenderness. No fluctuance. Moderate edema on left leg compared to right. Pulse intact.      ED Treatments / Results  Labs (all labs ordered are listed, but only abnormal results are displayed) Labs Reviewed  COMPREHENSIVE METABOLIC PANEL - Abnormal; Notable for the following:       Result Value   Chloride 112 (*)    Albumin 3.2 (*)    All other components within normal limits  CBC WITH DIFFERENTIAL/PLATELET    EKG  EKG Interpretation None       Radiology No results found.  Procedures Procedures (including critical care time)  Medications Ordered in ED Medications - No data to display   Initial Impression / Assessment and Plan / ED Course  I have reviewed the triage vital signs and the nursing notes.  Pertinent labs & imaging results that were available during my care of the patient were reviewed by me and considered in my medical decision making (see chart for details).  Clinical Course    Patient with spina bifida. Moderate edema has been looked at  for blood clots in the past. Has pain with it. Also pressure ulcers. Labs reassuring. Will have home health help arrange further follow-up. May need some wound care. Also his wheelchair broken. Discharge home.  Final Clinical Impressions(s) / ED Diagnoses   Final diagnoses:  Pressure ulcer due to spina bifida ([place])    New Prescriptions New Prescriptions   OXYCODONE-ACETAMINOPHEN (PERCOCET/ROXICET) 5-325 MG TABLET    Take 1-2 tablets by mouth every 6 (six) hours as needed for severe pain.     [name], MD 07/02/16 2147

## 2016-07-02 NOTE — Care Management Note (Signed)
Case Management Note  Patient Details  Name: Patrick Orozco MRN: [id number] Date of Birth: May 30, 1956  Subjective/Objective:   Patient presents to Ed with pain in left leg.                 Action/Plan: EDCM discussed home health services with patient at bedside.  EDCM provided patient with list of home health agencies in [facility] of which patient has chosen [place]. Patient reports he has an aide who comes to his home Mon-Fri sometimes Sat for 2 hours a day.  He reports his electric wheelchair is not working and someone is coming out to fix it on Friday.  Patient reports he has a colostomy and uses [place] catheter with leg bag for his urine.  He reports his colostomy supplies are sent to him through the mail a six month supply every three months.  He reports he has extra [place] catheters at home as well.  He reports his aide can assist him with his meals at home until his wheelchair gets fixed.  He reports he will use the urinals he has at home to empty colostomy and urine and his aide will assist.  He reports he uses [facility] for doctor appointments.  He confirms his pcp is Dr. Lin Landsman and that he saw her a few weeks ago. He reports he speaks to his pastor for support. Patient reports he has been to the wound care clinic in the past.  Discussed patient with EDP.  Home health orders placed for RN and SW.  Patient reports he doesn't need PT, OT.  [facility] faxed referral to [facility] with confirmation of receipt.   Expected Discharge Date:                  Expected Discharge Plan:  [place]  In-House Referral:     Discharge planning Services  CM Consult  Post Acute Care Choice:  Home Health Choice offered to:  Patient  DME Arranged:   (none needed per patient) DME Agency:     HH Arranged:  RN, Social Work [employer] Agency:  [employer] (now [facility])  Status of Service:  Completed, signed off  If discussed at [employer], dates discussed:     Additional CommentsLivia Snellen, RN 07/02/2016, 9:01 PM

## 2016-07-02 NOTE — Discharge Instructions (Signed)
Home health will comeand help arrange further treatment.

## 2016-07-03 NOTE — Progress Notes (Signed)
07/03/2016 1650pm A. Mikinzie Maciejewski RNCM EDCM spoke to [place] at [facility] to see if patient qualifies for services.  Patient does not qualify for [facility] services.  EDCM spoke to Seth Bake at [facility] to inquire about services there for patient.  [facility] called patient for follow up.  Patient reports that he is currently at [facility].  He reports the people cam eout and fixed his wheelchair this morning.  EDCM discussed [facility] services with patient.  Patient is agreeable to have [facility] send [facility] his information to discuss services.  [facility] faxed face sheet and EDP note to [facility] at [phone] with confirmation of receipt.  Patient thankful for follow up phone call.  No further EDCM needs at this time.

## 2016-09-08 ENCOUNTER — Encounter (HOSPITAL_BASED_OUTPATIENT_CLINIC_OR_DEPARTMENT_OTHER): Payer: Medicare HMO | Attending: Internal Medicine

## 2016-09-08 DIAGNOSIS — E785 Hyperlipidemia, unspecified: Secondary | ICD-10-CM | POA: Diagnosis not present

## 2016-09-08 DIAGNOSIS — I10 Essential (primary) hypertension: Secondary | ICD-10-CM | POA: Insufficient documentation

## 2016-09-08 DIAGNOSIS — G4730 Sleep apnea, unspecified: Secondary | ICD-10-CM | POA: Insufficient documentation

## 2016-09-08 DIAGNOSIS — L89313 Pressure ulcer of right buttock, stage 3: Secondary | ICD-10-CM | POA: Insufficient documentation

## 2016-09-08 DIAGNOSIS — G8220 Paraplegia, unspecified: Secondary | ICD-10-CM | POA: Diagnosis not present

## 2016-09-08 DIAGNOSIS — L89323 Pressure ulcer of left buttock, stage 3: Secondary | ICD-10-CM | POA: Diagnosis not present

## 2016-09-08 DIAGNOSIS — L89223 Pressure ulcer of left hip, stage 3: Secondary | ICD-10-CM | POA: Insufficient documentation

## 2016-09-08 DIAGNOSIS — K219 Gastro-esophageal reflux disease without esophagitis: Secondary | ICD-10-CM | POA: Diagnosis not present

## 2016-09-08 DIAGNOSIS — Q059 Spina bifida, unspecified: Secondary | ICD-10-CM | POA: Diagnosis not present

## 2016-10-09 ENCOUNTER — Encounter (HOSPITAL_BASED_OUTPATIENT_CLINIC_OR_DEPARTMENT_OTHER): Payer: Medicare HMO | Attending: Internal Medicine

## 2016-10-09 DIAGNOSIS — G4730 Sleep apnea, unspecified: Secondary | ICD-10-CM | POA: Diagnosis not present

## 2016-10-09 DIAGNOSIS — L89323 Pressure ulcer of left buttock, stage 3: Secondary | ICD-10-CM | POA: Insufficient documentation

## 2016-10-09 DIAGNOSIS — Z86718 Personal history of other venous thrombosis and embolism: Secondary | ICD-10-CM | POA: Insufficient documentation

## 2016-10-09 DIAGNOSIS — Z933 Colostomy status: Secondary | ICD-10-CM | POA: Insufficient documentation

## 2016-10-09 DIAGNOSIS — G8220 Paraplegia, unspecified: Secondary | ICD-10-CM | POA: Insufficient documentation

## 2016-10-09 DIAGNOSIS — Q059 Spina bifida, unspecified: Secondary | ICD-10-CM | POA: Insufficient documentation

## 2016-10-09 DIAGNOSIS — L89223 Pressure ulcer of left hip, stage 3: Secondary | ICD-10-CM | POA: Insufficient documentation

## 2016-10-09 DIAGNOSIS — L89893 Pressure ulcer of other site, stage 3: Secondary | ICD-10-CM | POA: Insufficient documentation

## 2016-10-09 DIAGNOSIS — I10 Essential (primary) hypertension: Secondary | ICD-10-CM | POA: Diagnosis not present

## 2016-10-16 DIAGNOSIS — L89323 Pressure ulcer of left buttock, stage 3: Secondary | ICD-10-CM | POA: Diagnosis not present

## 2016-10-30 ENCOUNTER — Inpatient Hospital Stay (HOSPITAL_COMMUNITY)
Admission: EM | Admit: 2016-10-30 | Discharge: 2016-11-05 | DRG: 603 | Disposition: A | Discharge status: Home Health | Payer: Medicare HMO | Attending: Internal Medicine | Admitting: Internal Medicine

## 2016-10-30 ENCOUNTER — Emergency Department (HOSPITAL_COMMUNITY): Payer: Medicare HMO

## 2016-10-30 ENCOUNTER — Encounter (HOSPITAL_COMMUNITY): Payer: Self-pay | Admitting: *Deleted

## 2016-10-30 DIAGNOSIS — F329 Major depressive disorder, single episode, unspecified: Secondary | ICD-10-CM | POA: Diagnosis present

## 2016-10-30 DIAGNOSIS — L89899 Pressure ulcer of other site, unspecified stage: Secondary | ICD-10-CM | POA: Diagnosis present

## 2016-10-30 DIAGNOSIS — I959 Hypotension, unspecified: Secondary | ICD-10-CM | POA: Diagnosis present

## 2016-10-30 DIAGNOSIS — N319 Neuromuscular dysfunction of bladder, unspecified: Secondary | ICD-10-CM | POA: Diagnosis present

## 2016-10-30 DIAGNOSIS — K219 Gastro-esophageal reflux disease without esophagitis: Secondary | ICD-10-CM | POA: Diagnosis present

## 2016-10-30 DIAGNOSIS — Z87891 Personal history of nicotine dependence: Secondary | ICD-10-CM | POA: Diagnosis not present

## 2016-10-30 DIAGNOSIS — Z8669 Personal history of other diseases of the nervous system and sense organs: Secondary | ICD-10-CM

## 2016-10-30 DIAGNOSIS — I10 Essential (primary) hypertension: Secondary | ICD-10-CM | POA: Diagnosis present

## 2016-10-30 DIAGNOSIS — Q059 Spina bifida, unspecified: Secondary | ICD-10-CM

## 2016-10-30 DIAGNOSIS — D869 Sarcoidosis, unspecified: Secondary | ICD-10-CM | POA: Diagnosis present

## 2016-10-30 DIAGNOSIS — R9431 Abnormal electrocardiogram [ECG] [EKG]: Secondary | ICD-10-CM

## 2016-10-30 DIAGNOSIS — R627 Adult failure to thrive: Secondary | ICD-10-CM | POA: Diagnosis present

## 2016-10-30 DIAGNOSIS — G4733 Obstructive sleep apnea (adult) (pediatric): Secondary | ICD-10-CM | POA: Diagnosis present

## 2016-10-30 DIAGNOSIS — E876 Hypokalemia: Secondary | ICD-10-CM | POA: Diagnosis present

## 2016-10-30 DIAGNOSIS — E785 Hyperlipidemia, unspecified: Secondary | ICD-10-CM | POA: Diagnosis present

## 2016-10-30 DIAGNOSIS — Z79899 Other long term (current) drug therapy: Secondary | ICD-10-CM

## 2016-10-30 DIAGNOSIS — Z6833 Body mass index (BMI) 33.0-33.9, adult: Secondary | ICD-10-CM

## 2016-10-30 DIAGNOSIS — Z91041 Radiographic dye allergy status: Secondary | ICD-10-CM

## 2016-10-30 DIAGNOSIS — R531 Weakness: Secondary | ICD-10-CM

## 2016-10-30 DIAGNOSIS — Z89422 Acquired absence of other left toe(s): Secondary | ICD-10-CM | POA: Diagnosis not present

## 2016-10-30 DIAGNOSIS — Z87728 Personal history of other specified (corrected) congenital malformations of nervous system and sense organs: Secondary | ICD-10-CM | POA: Diagnosis not present

## 2016-10-30 DIAGNOSIS — L8990 Pressure ulcer of unspecified site, unspecified stage: Secondary | ICD-10-CM | POA: Diagnosis present

## 2016-10-30 DIAGNOSIS — G8220 Paraplegia, unspecified: Secondary | ICD-10-CM | POA: Diagnosis present

## 2016-10-30 DIAGNOSIS — Z933 Colostomy status: Secondary | ICD-10-CM

## 2016-10-30 DIAGNOSIS — E669 Obesity, unspecified: Secondary | ICD-10-CM | POA: Diagnosis present

## 2016-10-30 DIAGNOSIS — L03116 Cellulitis of left lower limb: Secondary | ICD-10-CM | POA: Diagnosis present

## 2016-10-30 LAB — URINALYSIS, ROUTINE W REFLEX MICROSCOPIC
Bilirubin Urine: NEGATIVE
GLUCOSE, UA: NEGATIVE mg/dL
Hgb urine dipstick: NEGATIVE
Ketones, ur: NEGATIVE mg/dL
NITRITE: NEGATIVE
PH: 7 (ref 5.0–8.0)
PROTEIN: NEGATIVE mg/dL
SPECIFIC GRAVITY, URINE: 1.016 (ref 1.005–1.030)

## 2016-10-30 LAB — COMPREHENSIVE METABOLIC PANEL
ALK PHOS: 87 U/L (ref 38–126)
ALT: 14 U/L — AB (ref 17–63)
ANION GAP: 11 (ref 5–15)
AST: 22 U/L (ref 15–41)
Albumin: 2.8 g/dL — ABNORMAL LOW (ref 3.5–5.0)
BUN: 20 mg/dL (ref 6–20)
CALCIUM: 9 mg/dL (ref 8.9–10.3)
CO2: 20 mmol/L — ABNORMAL LOW (ref 22–32)
CREATININE: 1.13 mg/dL (ref 0.61–1.24)
Chloride: 104 mmol/L (ref 101–111)
Glucose, Bld: 93 mg/dL (ref 65–99)
Potassium: 3.4 mmol/L — ABNORMAL LOW (ref 3.5–5.1)
Sodium: 135 mmol/L (ref 135–145)
TOTAL PROTEIN: 7 g/dL (ref 6.5–8.1)
Total Bilirubin: 0.6 mg/dL (ref 0.3–1.2)

## 2016-10-30 LAB — DIFFERENTIAL
BASOS PCT: 0 %
Basophils Absolute: 0 10*3/uL (ref 0.0–0.1)
EOS ABS: 0.2 10*3/uL (ref 0.0–0.7)
EOS PCT: 3 %
LYMPHS PCT: 13 %
Lymphs Abs: 0.8 10*3/uL (ref 0.7–4.0)
Monocytes Absolute: 0.5 10*3/uL (ref 0.1–1.0)
Monocytes Relative: 8 %
NEUTROS PCT: 76 %
Neutro Abs: 4.5 10*3/uL (ref 1.7–7.7)

## 2016-10-30 LAB — I-STAT TROPONIN, ED: TROPONIN I, POC: 0.01 ng/mL (ref 0.00–0.08)

## 2016-10-30 LAB — CBC
HCT: 40.4 % (ref 39.0–52.0)
HEMOGLOBIN: 13.3 g/dL (ref 13.0–17.0)
MCH: 28.1 pg (ref 26.0–34.0)
MCHC: 32.9 g/dL (ref 30.0–36.0)
MCV: 85.2 fL (ref 78.0–100.0)
PLATELETS: 180 10*3/uL (ref 150–400)
RBC: 4.74 MIL/uL (ref 4.22–5.81)
RDW: 14.9 % (ref 11.5–15.5)
WBC: 6.2 10*3/uL (ref 4.0–10.5)

## 2016-10-30 LAB — I-STAT CG4 LACTIC ACID, ED: Lactic Acid, Venous: 3.69 mmol/L (ref 0.5–1.9)

## 2016-10-30 LAB — LACTIC ACID, PLASMA: Lactic Acid, Venous: 1.5 mmol/L (ref 0.5–1.9)

## 2016-10-30 MED ORDER — ACETAMINOPHEN 650 MG RE SUPP: 650.0000 mg | Freq: Four times a day (QID) | RECTAL | Status: DC | PRN

## 2016-10-30 MED ORDER — VANCOMYCIN HCL IN DEXTROSE 1-5 GM/200ML-% IV SOLN
1000.0000 mg | Freq: Once | INTRAVENOUS | Status: AC
  Administered 2016-10-30: 1000 mg via INTRAVENOUS
  Filled 2016-10-30: qty 200

## 2016-10-30 MED ORDER — METOPROLOL SUCCINATE ER 50 MG PO TB24
50.0000 mg | ORAL_TABLET | Freq: Every day | ORAL | Status: DC
  Administered 2016-10-30 – 2016-11-05 (×7): 50 mg via ORAL
  Filled 2016-10-30 (×7): qty 1

## 2016-10-30 MED ORDER — ACETAMINOPHEN 325 MG PO TABS
650.0000 mg | ORAL_TABLET | Freq: Four times a day (QID) | ORAL | Status: DC | PRN
  Filled 2016-10-30: qty 2

## 2016-10-30 MED ORDER — CEFAZOLIN IN D5W 1 GM/50ML IV SOLN
1.0000 g | Freq: Three times a day (TID) | INTRAVENOUS | Status: DC
  Administered 2016-10-30 – 2016-11-04 (×16): 1 g via INTRAVENOUS
  Filled 2016-10-30 (×17): qty 50

## 2016-10-30 MED ORDER — IPRATROPIUM-ALBUTEROL 0.5-2.5 (3) MG/3ML IN SOLN
3.0000 mL | Freq: Once | RESPIRATORY_TRACT | Status: AC
  Administered 2016-10-30: 3 mL via RESPIRATORY_TRACT
  Filled 2016-10-30: qty 3

## 2016-10-30 MED ORDER — ENOXAPARIN SODIUM 40 MG/0.4ML ~~LOC~~ SOLN
40.0000 mg | SUBCUTANEOUS | Status: DC
  Administered 2016-10-30 – 2016-11-04 (×6): 40 mg via SUBCUTANEOUS
  Filled 2016-10-30 (×6): qty 0.4

## 2016-10-30 MED ORDER — MOMETASONE FURO-FORMOTEROL FUM 200-5 MCG/ACT IN AERO
2.0000 | INHALATION_SPRAY | Freq: Two times a day (BID) | RESPIRATORY_TRACT | Status: DC
  Administered 2016-10-30 – 2016-11-05 (×11): 2 via RESPIRATORY_TRACT
  Filled 2016-10-30: qty 8.8

## 2016-10-30 MED ORDER — SIMVASTATIN 10 MG PO TABS
20.0000 mg | ORAL_TABLET | Freq: Every day | ORAL | Status: DC
  Administered 2016-10-30 – 2016-11-04 (×6): 20 mg via ORAL
  Filled 2016-10-30 (×6): qty 2

## 2016-10-30 MED ORDER — SODIUM CHLORIDE 0.9 % IV SOLN
INTRAVENOUS | Status: DC
  Administered 2016-10-30 – 2016-11-01 (×4): via INTRAVENOUS

## 2016-10-30 MED ORDER — PIPERACILLIN-TAZOBACTAM 3.375 G IVPB: 3.3750 g | Freq: Three times a day (TID) | INTRAVENOUS | Status: DC

## 2016-10-30 MED ORDER — VANCOMYCIN HCL IN DEXTROSE 1-5 GM/200ML-% IV SOLN: 1000.0000 mg | Freq: Two times a day (BID) | INTRAVENOUS | Status: DC

## 2016-10-30 MED ORDER — SODIUM CHLORIDE 0.9 % IV BOLUS (SEPSIS)
1000.0000 mL | Freq: Once | INTRAVENOUS | Status: AC
  Administered 2016-10-30: 1000 mL via INTRAVENOUS

## 2016-10-30 MED ORDER — PIPERACILLIN-TAZOBACTAM 3.375 G IVPB 30 MIN
3.3750 g | Freq: Once | INTRAVENOUS | Status: AC
Start: 2016-10-30 — End: 2016-10-30
  Administered 2016-10-30: 3.375 g via INTRAVENOUS
  Filled 2016-10-30: qty 50

## 2016-10-30 NOTE — ED Notes (Signed)
Patient is alert and oriented to baseline.  Patient states that he has been not feeling well for a week and noticed his appetite and strength decreasing in the last 4 days.  Patient denies any pain, nausea, vomiting or diarrhea.  Patient lives at home alone and it normally independent.

## 2016-10-30 NOTE — Progress Notes (Signed)
Noted Cm consult for pt being followed for home health [facility] Pt left [facility] in August with HH Arranged:  RN, Social Work [employer] Agency:  [facility] ED [place] [facility] aware to follow pt for d/c needs

## 2016-10-30 NOTE — ED Notes (Signed)
Second Culture yellow tube only

## 2016-10-30 NOTE — H&P (Signed)
History and Physical  Patrick Orozco UXN:[id number] DOB: 1956-03-11 DOA: 10/30/2016  PCP: Kristine Garbe, MD  Patient coming from: home  Chief Complaint: poor appetite  HPI:  This is a 60 year old male presenting from home with 1 week history of failure to thrive, poor appetite, decreased energy, cough and chills. Initial evaluation revealed elevated lactic acid, otherwise unremarkable laboratory studies. Physical exam notable for left lower extremity erythema. Admitted for cellulitis of the left lower leg, rule out sepsis.  Patient lives at home, he reports for the last week he's had malaise, poor energy, poor appetite. Spending a lot of the time sitting on the couch and has developed some breakdown over his sacrum which is a new issue. He said subjective fever and chills. Some cough but no shortness of breath. No chest pain. No abdominal pain. He did notice his left leg had erythema which is new but he has poor sensation in his legs and has no significant pain. No specific aggravating or alleviating factors noted.  EDP expressed concern for sepsis.  ED Course: temp 100.3, RR 13-28, HR 93-111, SBP 97-126, most recent 97/62, normal sat. BC and UC sent, treated with Zosyn, vancomycin Pertinent labs: CBC, CMP unremarkable. Lactic acid 3.69, troponin 0.01, u/a equivocal. Imaging: CXR no acute disease (my read). I concur with radiology interpretation.  Review of Systems:  Negative for new  visual changes, rash, chest pain, SOB, dysuria, bleeding, n/v/abdominal pain.  Positive for sore throat, new muscle aches  Past Medical History:  Diagnosis Date  . Colostomy in place [facility])   . Depression    wife passed July 2013  . GERD (gastroesophageal reflux disease)   . Hyperlipidemia   . Hypertension   . Kidney stones   . Kidney stones   . Neurogenic bladder    uses condom cath  . Sarcoidosis ([place])   . Shortness of breath    occassionally  . Sleep apnea    pt denies sleep apnea pt states  never followed through with sleep study; pt scored 5 per stop bang tool per PAT visit 07/17/2015; results sent to PCP Dr Ayesha Rumpf  . Spina bifida   . UTI (lower urinary tract infection)     Past Surgical History:  Procedure Laterality Date  . COLOSTOMY    . CYSTOSCOPY W/ URETERAL STENT PLACEMENT Left 06/15/2015   Procedure: CYSTOSCOPY WITH STENT REPLACEMENT;  Surgeon: Raynelle Bring, MD;  Location: [facility];  Service: Urology;  Laterality: Left;  . CYSTOSCOPY/URETEROSCOPY/HOLMIUM LASER/STENT PLACEMENT Left 07/18/2015   Procedure: 1ST STAGE CYSTOSCOPY/URETEROSCOPY/HOLMIUM LASER/STENT REPLACEMENT;  Surgeon: Alexis Frock, MD;  Location: [facility];  Service: Urology;  Laterality: Left;  . CYSTOSCOPY/URETEROSCOPY/HOLMIUM LASER/STENT PLACEMENT Left 07/20/2015   Procedure: 2ND STAGE CYSTOSCOPY LEFT URETEROSCOPY STENT REPLACEMENT left retrograde;  Surgeon: Alexis Frock, MD;  Location: [facility];  Service: Urology;  Laterality: Left;  . ENDOBRONCHIAL ULTRASOUND Bilateral 04/25/2013   Procedure: ENDOBRONCHIAL ULTRASOUND;  Surgeon: Collene Gobble, MD;  Location: [facility];  Service: Cardiopulmonary;  Laterality: Bilateral;  . SPINE SURGERY     multiple surgeries related to spina bifida  . TOE AMPUTATION  2010   toe on left foot     reports that he quit smoking about 24 years ago. His smoking use included Cigarettes. He has a 5.50 pack-year smoking history. He has never used smokeless tobacco. He reports that he does not drink alcohol or use drugs. Mobility: Bilateral lower extremity paralysis secondary to spina bifida  Allergies  Allergen Reactions  . Ivp Dye [  Iodinated Diagnostic Agents] Itching    Family History  Problem Relation Age of Onset  . Cancer Mother     ?lung  . Cancer Maternal Grandfather     lung     Prior to Admission medications   Medication Sig Start Date End Date Taking? Authorizing Provider  Fluticasone-Salmeterol (ADVAIR) 250-50 MCG/DOSE AEPB Inhale 1 puff into the lungs 2  (two) times daily as needed (shortness of breath).    Yes Historical Provider, MD  metoprolol (TOPROL-XL) 50 MG 24 hr tablet Take 50 mg by mouth daily.    Yes Historical Provider, MD  simvastatin (ZOCOR) 20 MG tablet Take 20 mg by mouth at bedtime.    Yes Historical Provider, MD  docusate sodium (COLACE) 100 MG capsule Take 1 capsule (100 mg total) by mouth every 12 (twelve) hours. Patient not taking: Reported on 10/30/2016 10/22/15   [name], MD  ondansetron (ZOFRAN ODT) 4 MG disintegrating tablet Take 1 tablet (4 mg total) by mouth every 8 (eight) hours as needed for nausea or vomiting. Patient not taking: Reported on 10/30/2016 05/30/16   [name], MD  oxyCODONE-acetaminophen (PERCOCET/ROXICET) 5-325 MG tablet Take 1-2 tablets by mouth every 6 (six) hours as needed for severe pain. Patient not taking: Reported on 10/30/2016 07/02/16   [name], MD    Physical Exam: Vitals:   10/30/16 0837 10/30/16 0930 10/30/16 1015 10/30/16 1145  BP:  '97/56 97/62 93/57 '$  Pulse:  97 97 94  Resp:  (!) 27 (!) 28 (!) 28  Temp: 100.3 F (37.9 C)     TempSrc: Rectal     SpO2:  96% 100% 97%  Weight:      Height:       Constitutional:  . Appears calm and comfortable Eyes:  . Pupils and irises appear normal . Normal  lids ENMT:  . external ears, nose appear normal . grossly normal hearing . Lips appear normal Neck:  . neck appears normal, no masses . no thyromegaly Respiratory:  . CTA bilaterally, no w/r/r.  . Respiratory effort normal. No retractions or accessory muscle use Cardiovascular:  . RRR, no m/r/g . 1+ right LE extremity edema   2+ left lower extremity edema. Abdomen:  . Soft, nontender, nondistended. Colostomy left lower quadrant appears unremarkable. . No hernias noted Musculoskeletal:  . Digits/nails: Bilateral hands: no clubbing, cyanosis, petechiae, infection . Bilateral lower extremity weakness. No abnormal movements. No tenderness. Skin:  . There is  significant left lower extremity erythema, predominantly above the thigh, not involving the knee or lower leg. The foot also has no erythema. The perineum appears unremarkable. . palpation of skin: no induration or nodules Left foot very dry skin. One toe absent. Psychiatric:  . judgement and insight appear normal . Mental status o Mood, affect appropriate  Wt Readings from Last 3 Encounters:  10/30/16 90.7 kg (200 lb)  05/30/16 104.3 kg (230 lb)  10/21/15 81.6 kg (180 lb)    I have personally reviewed following labs and imaging studies  Labs on Admission:  CBC:  Recent Labs Lab 10/30/16 0615  WBC 6.2  NEUTROABS 4.5  HGB 13.3  HCT 40.4  MCV 85.2  PLT 253   Basic Metabolic Panel:  Recent Labs Lab 10/30/16 0615  NA 135  K 3.4*  CL 104  CO2 20*  GLUCOSE 93  BUN 20  CREATININE 1.13  CALCIUM 9.0   Liver Function Tests:  Recent Labs Lab 10/30/16 0615  AST 22  ALT 14*  ALKPHOS  87  BILITOT 0.6  PROT 7.0  ALBUMIN 2.8*   Urine analysis:    Component Value Date/Time   COLORURINE YELLOW 10/30/2016 0917   APPEARANCEUR HAZY (A) 10/30/2016 0917   LABSPEC 1.016 10/30/2016 0917   PHURINE 7.0 10/30/2016 0917   GLUCOSEU NEGATIVE 10/30/2016 0917   HGBUR NEGATIVE 10/30/2016 0917   BILIRUBINUR NEGATIVE 10/30/2016 0917   KETONESUR NEGATIVE 10/30/2016 0917   PROTEINUR NEGATIVE 10/30/2016 0917   UROBILINOGEN 1.0 06/15/2015 1840   NITRITE NEGATIVE 10/30/2016 0917   LEUKOCYTESUR LARGE (A) 10/30/2016 0917    Radiological Exams on Admission: Dg Chest 2 View  Result Date: 10/30/2016 CLINICAL DATA:  Weakness with loss of appetite.  Cough. EXAM: CHEST  2 VIEW COMPARISON:  Chest radiograph January 05, 2013 and chest CT April 06, 2013 FINDINGS: There is no edema or consolidation. Heart size and pulmonary vascular normal. There is persistent soft tissue fullness in the hilar regions as well as more subtly in the paratracheal regions. No bone lesions. IMPRESSION: Evidence of a  degree of adenopathy, a finding also present on prior chest CT. Suspect underlying sarcoidosis. A lymphoproliferative disorder could present similarly and is a potential differential consideration. The appearance by radiography is similar to prior studies. There is no edema or consolidation. Electronically Signed   By: Lowella Grip III M.D.   On: 10/30/2016 07:32    EKG: Independently reviewed: ST, prolonged QT (new), non-specific t wave abnormality (new), cannot r/o septal MI, age unknown (new)  Principal Problem:   Left leg cellulitis Active Problems:   History of spina bifida   H/O paraplegia   Pressure ulcer   Generalized weakness   Prolonged Q-T interval on ECG   Assessment/Plan 1. Left lower extremity cellulitis with associated generalized weakness, failure to thrive. (Only 1 SIRS (HR)). Elevated lactate <4, likely elevated secondary to poor oral intake and failure to thrive by history. Hemodynamics stable. No evidence of sepsis at this point.  IV antibiotics, IVF, trend lactic acid/rule out sepsis 2. Generalized weakness, likely secondary to poor oral intake and infection.  Consult physical therapy 3. Prolonged QT  Avoid prolonging agents. 4. PMH spina bifida, bilateral LE paralysis, sarcodosis, neurogenic bladder, pressure ulcer perianal present on admission, coloscomychronic LLE edema.Marland Kitchen   Appears stable for admission to medical floor. Sepsis is not suspected at this time.  DVT prophylaxis: Lovenox Code Status: full code Family Communication: none Consults: wound care    Time spent: 66 minutes  Murray Hodgkins, MD  [facility] Direct contact: [phone] --[place]ia [place]  --[email or web address]; password [email or web address]  7PM-7AM contact night coverage as above  10/30/2016, 12:48 PM

## 2016-10-30 NOTE — ED Notes (Signed)
Hold fluid per [place], [place]

## 2016-10-30 NOTE — Progress Notes (Signed)
Pharmacy Antibiotic Note  Patrick Orozco is a 60 y.o. male admitted on 10/30/2016 with sepsis.  Pharmacy has been consulted for Vanc/Zosyn dosing.  Plan: Vancomycin 1g  IV every 12 hours.  Goal trough 15-20 mcg/mL. Zosyn 3.375g IV q8h (4 hour infusion).  Height: '5\' 5"'$  (165.1 cm) Weight: 200 lb (90.7 kg) IBW/kg (Calculated) : 61.5  Temp (24hrs), Avg:99.7 F (37.6 C), Min:99 F (37.2 C), Max:100.3 F (37.9 C)   Recent Labs Lab 10/30/16 0615 10/30/16 0718  WBC 6.2  --   CREATININE 1.13  --   LATICACIDVEN  --  3.69*    Estimated Creatinine Clearance: 71.1 mL/min (by C-G formula based on SCr of 1.13 mg/dL).    Allergies  Allergen Reactions  . Ivp Dye [Iodinated Diagnostic Agents] Itching    Thank you for allowing pharmacy to be a part of this patient's care.   Adrian Saran, PharmD, BCPS Pager [phone] 10/30/2016 9:46 AM

## 2016-10-30 NOTE — ED Provider Notes (Signed)
[facility] DEPT Provider Note   CSN: [id number] Arrival date & time: 10/30/16  9024     History   Chief Complaint Chief Complaint  Patient presents with  . Weakness    HPI Patrick Orozco is a 60 y.o. male  Patient is a 60 year old male with with history of spina bifida, sarcoidosis, pressure ulcers presents to the ED for weakness last 3 days. Patient states that he has had a slightly productive cough with no blood present. Patient reports his appetite is decreasing as well as strength decreasing. Patient denies any focal neurological deficits. Patient also reports headache "sometimes when he coughs", frontal pain, achy, and 2/10. He also reports chills. Patient states he had several episodes last week where he slid off the edge of his couch onto the floor and had more difficulty than usual. He denies any injuries and no LOC. He denies taking anything for his symptoms. Patient denies any visual symptoms. Patient reports not being ambulatory and chronic paralysis due to his spina bifida. He has reported ulcer on the left heel and above his anus. Patient has chronic swelling mostly of his left leg. Patient states that he has had ultrasound done for his left leg and which revealed no DVT. Patient states that he has colostomy and has not noticed any change in stool output. Patient denies any dysuria or change in urination. Patient denies chest pain, shortness of breath, fevers, nausea, vomiting.    The history is provided by the patient.     Past Medical History:  Diagnosis Date  . Colostomy in place [facility])   . Depression    wife passed July 2013  . GERD (gastroesophageal reflux disease)   . Hyperlipidemia   . Hypertension   . Kidney stones   . Kidney stones   . Neurogenic bladder    uses condom cath  . Sarcoidosis ([place])   . Shortness of breath    occassionally  . Sleep apnea    pt denies sleep apnea pt states never followed through with sleep study; pt scored 5 per stop  bang tool per PAT visit 07/17/2015; results sent to PCP Dr Ayesha Rumpf  . Spina bifida   . UTI (lower urinary tract infection)     Patient Active Problem List   Diagnosis Date Noted  . Weakness 10/30/2016  . Pressure ulcer 07/19/2015  . [place] kidney stones 07/18/2015  . Ureteral stone with hydronephrosis 06/15/2015  . OSA (obstructive sleep apnea) 07/13/2014  . UTI (urinary tract infection), bacterial 2/2 GNR's 01/07/2013  . Cardiac enzymes elevated 01/07/2013  . Atypical chest pain 01/07/2013  . H/O paraplegia 01/07/2013  . Recurrent kidney stones-non obstructive 01/07/2013  . Sarcoidosis ([place]) 01/07/2013  . Nausea and vomiting 01/05/2013  . Acute pyelonephritis 01/05/2013  . Left flank pain 01/05/2013  . Thrombocytopenia ([place]) 01/05/2013  . Hypertension 01/05/2013  . History of spina bifida 01/05/2013    Past Surgical History:  Procedure Laterality Date  . COLOSTOMY    . CYSTOSCOPY W/ URETERAL STENT PLACEMENT Left 06/15/2015   Procedure: CYSTOSCOPY WITH STENT REPLACEMENT;  Surgeon: Raynelle Bring, MD;  Location: [facility];  Service: Urology;  Laterality: Left;  . CYSTOSCOPY/URETEROSCOPY/HOLMIUM LASER/STENT PLACEMENT Left 07/18/2015   Procedure: 1ST STAGE CYSTOSCOPY/URETEROSCOPY/HOLMIUM LASER/STENT REPLACEMENT;  Surgeon: Alexis Frock, MD;  Location: [facility];  Service: Urology;  Laterality: Left;  . CYSTOSCOPY/URETEROSCOPY/HOLMIUM LASER/STENT PLACEMENT Left 07/20/2015   Procedure: 2ND STAGE CYSTOSCOPY LEFT URETEROSCOPY STENT REPLACEMENT left retrograde;  Surgeon: Alexis Frock, MD;  Location: [facility];  Service: Urology;  Laterality: Left;  . ENDOBRONCHIAL ULTRASOUND Bilateral 04/25/2013   Procedure: ENDOBRONCHIAL ULTRASOUND;  Surgeon: Collene Gobble, MD;  [facility];  Service: Cardiopulmonary;  Laterality: Bilateral;  . SPINE SURGERY     multiple surgeries related to spina bifida  . TOE AMPUTATION  2010   toe on left foot       Home Medications    Prior to Admission  medications   Medication Sig Start Date End Date Taking? Authorizing Provider  Fluticasone-Salmeterol (ADVAIR) 250-50 MCG/DOSE AEPB Inhale 1 puff into the lungs 2 (two) times daily as needed (shortness of breath).    Yes Historical Provider, MD  metoprolol (TOPROL-XL) 50 MG 24 hr tablet Take 50 mg by mouth daily.    Yes Historical Provider, MD  simvastatin (ZOCOR) 20 MG tablet Take 20 mg by mouth at bedtime.    Yes Historical Provider, MD  docusate sodium (COLACE) 100 MG capsule Take 1 capsule (100 mg total) by mouth every 12 (twelve) hours. Patient not taking: Reported on 10/30/2016 10/22/15   [name], MD  ondansetron (ZOFRAN ODT) 4 MG disintegrating tablet Take 1 tablet (4 mg total) by mouth every 8 (eight) hours as needed for nausea or vomiting. Patient not taking: Reported on 10/30/2016 05/30/16   [name], MD  oxyCODONE-acetaminophen (PERCOCET/ROXICET) 5-325 MG tablet Take 1-2 tablets by mouth every 6 (six) hours as needed for severe pain. Patient not taking: Reported on 10/30/2016 07/02/16   [name], MD    Family History Family History  Problem Relation Age of Onset  . Cancer Mother     ?lung  . Cancer Maternal Grandfather     lung    Social History Social History  Substance Use Topics  . Smoking status: Former Smoker    Packs/day: 0.25    Years: 22.00    Types: Cigarettes    Quit date: 11/03/1992  . Smokeless tobacco: Never Used  . Alcohol use No     Allergies   Ivp dye [iodinated diagnostic agents]   Review of Systems Review of Systems  Constitutional: Positive for chills. Negative for fever.  HENT: Negative for ear pain and sore throat.   Eyes: Negative for pain and visual disturbance.  Respiratory: Negative for cough and shortness of breath.   Cardiovascular: Negative for chest pain and palpitations.  Gastrointestinal: Negative for abdominal pain, nausea and vomiting.  Genitourinary: Negative for difficulty urinating, dysuria and hematuria.    Musculoskeletal: Negative for arthralgias, back pain, neck pain and neck stiffness.  Skin: Positive for wound (Decubitus ulcers above anus and left heel). Negative for color change.  Neurological: Positive for weakness and headaches. Negative for syncope, facial asymmetry and speech difficulty.     Physical Exam Updated Vital Signs BP 106/70   Pulse 101   Temp 100.3 F (37.9 C) (Rectal)   Resp 14   Ht '5\' 5"'$  (1.651 m)   Wt 90.7 kg   SpO2 100%   BMI 33.28 kg/m   Physical Exam  Constitutional: He is oriented to person, place, and time. He appears well-developed and well-nourished.  HENT:  Head: Normocephalic and atraumatic.  Nose: Nose normal.  Mouth/Throat: Oropharynx is clear and moist.  Eyes: Conjunctivae and EOM are normal. Pupils are equal, round, and reactive to light.  Neck: Normal range of motion. Neck supple.  Cardiovascular: Normal heart sounds.  Tachycardia present.   Pulmonary/Chest: Effort normal and breath sounds normal. No respiratory distress. He exhibits no tenderness.  Abdominal: Soft.  Bowel sounds are normal. There is no rebound and no guarding.  Colostomy lower left quadrant. Does not appear to have any discharge, surrounding erythema, or evidence of infection.   Musculoskeletal: Normal range of motion. He exhibits edema (Lower left leg - Chronic).  Neurological: He is alert and oriented to person, place, and time.  Cranial Nerves:  III,IV, VI: ptosis not present, extra-ocular movements intact bilaterally, direct and consensual pupillary light reflexes intact bilaterally V: facial sensation, jaw opening, and bite strength equal bilaterally VII: eyebrow raise, eyelid close, smile, frown, pucker equal bilaterally VIII: hearing grossly normal bilaterally  IX,X: palate elevation and swallowing intact XI: bilateral shoulder shrug and lateral head rotation equal and strong XII: midline tongue extension  Sensation intact bilaterally on upper extremity.  Strength  5/5 bilaterally on upper extremities.   Sensation above left knee intact. Sensation of right leg intact.  No sensation below left knee - chronic Strength 0/5 lower legs - chronic  Skin: Skin is warm. Capillary refill takes less than 2 seconds. There is erythema (Lower left leg - Chronic).  Redness and warmth from mid thigh down the left leg.  About 5 cm Decubitus ulcer above anus, on lower sacral area. Possibly stage 2 with slight discharge. Surrounding erythema noted.  Left heel does not appear to have break in skin. Possibly stage 1 ulcer.    Psychiatric: He has a normal mood and affect. His behavior is normal.  Nursing note and vitals reviewed.    ED Treatments / Results  Labs (all labs ordered are listed, but only abnormal results are displayed) Labs Reviewed  COMPREHENSIVE METABOLIC PANEL - Abnormal; Notable for the following:       Result Value   Potassium 3.4 (*)    CO2 20 (*)    Albumin 2.8 (*)    ALT 14 (*)    All other components within normal limits  URINALYSIS, ROUTINE W REFLEX MICROSCOPIC - Abnormal; Notable for the following:    APPearance HAZY (*)    Leukocytes, UA LARGE (*)    Bacteria, UA MANY (*)    Squamous Epithelial / LPF 0-5 (*)    All other components within normal limits  I-STAT CG4 LACTIC ACID, ED - Abnormal; Notable for the following:    Lactic Acid, Venous 3.69 (*)    All other components within normal limits  CULTURE, BLOOD (ROUTINE X 2)  CULTURE, BLOOD (ROUTINE X 2)  URINE CULTURE  CBC  DIFFERENTIAL  CBC WITH DIFFERENTIAL/PLATELET  I-STAT TROPOININ, ED    EKG  EKG Interpretation  Date/Time:  Thursday October 30 2016 05:37:11 EST Ventricular Rate:  115 PR Interval:    QRS Duration: 63 QT Interval:  371 QTC Calculation: 514 R Axis:   54 Text Interpretation:  Sinus tachycardia Probable left atrial enlargement Borderline T wave abnormalities Prolonged QT interval No STEMI.  Confirmed by LONG MD, JOSHUA [phone]) on 10/30/2016 8:20:48 AM         Radiology Dg Chest 2 View  Result Date: 10/30/2016 CLINICAL DATA:  Weakness with loss of appetite.  Cough. EXAM: CHEST  2 VIEW COMPARISON:  Chest radiograph January 05, 2013 and chest CT April 06, 2013 FINDINGS: There is no edema or consolidation. Heart size and pulmonary vascular normal. There is persistent soft tissue fullness in the hilar regions as well as more subtly in the paratracheal regions. No bone lesions. IMPRESSION: Evidence of a degree of adenopathy, a finding also present on prior chest CT. Suspect underlying sarcoidosis. A lymphoproliferative  disorder could present similarly and is a potential differential consideration. The appearance by radiography is similar to prior studies. There is no edema or consolidation. Electronically Signed   By: Lowella Grip III M.D.   On: 10/30/2016 07:32    Procedures Procedures (including critical care time)  Medications Ordered in ED Medications  vancomycin (VANCOCIN) IVPB 1000 mg/200 mL premix (not administered)  piperacillin-tazobactam (ZOSYN) IVPB 3.375 g (not administered)  sodium chloride 0.9 % bolus 1,000 mL (0 mLs Intravenous Stopped 10/30/16 1008)  piperacillin-tazobactam (ZOSYN) IVPB 3.375 g (0 g Intravenous Stopped 10/30/16 1008)  vancomycin (VANCOCIN) IVPB 1000 mg/200 mL premix (0 mg Intravenous Stopped 10/30/16 1115)  ipratropium-albuterol (DUONEB) 0.5-2.5 (3) MG/3ML nebulizer solution 3 mL (3 mLs Nebulization Given 10/30/16 1100)     Initial Impression / Assessment and Plan / ED Course  I have reviewed the triage vital signs and the nursing notes.  Pertinent labs & imaging results that were available during my care of the patient were reviewed by me and considered in my medical decision making (see chart for details).  Clinical Course   Patient is a 60 year old male with history of spina bifida, sarcoidosis, chronic lower leg paralysis, and decubitus pressure ulcers presenting with general weakness for 3 days. He also  states that he had productive cough with scant green/yellow sputum and slight headache sometimes when he coughs x 3 days.  On exam patient is slightly febrile at 100.3, slightly tachycardic, with blood pressure and respirations at within normal limits. Heart and lung sounds are clear. Redness and warmth from mid thigh down the left leg. About 5 cm Decubitus ulcer above anus, on lower sacral area. Possibly stage 2 with slight discharge. Surrounding erythema noted.  Left heel does not appear to have break in skin. Possibly stage 1 ulcer.  Cranial nerves III through XII intact. Sensation intact bilaterally on upper extremity. Strength 5/5 bilaterally on upper extremities. Sensation above left knee intact. Sensation of right leg intact. No sensation below left knee - chronic Strength 0/5 lower legs - chronic. Sepsis protocol started. Lactic acid at 3.69. Chest x-ray shows no acute changes from his baseline sarcoidosis.Troponin negative.  No significant changes on lab work. UA does show leukocytes and may be evidence of UTI. Patient already started on broad spectrum antibiotics from sepsis protocol.   I spoke with Dr. Sarajane Jews agreed to admit patient for further evaluation.   Final Clinical Impressions(s) / ED Diagnoses   Final diagnoses:  Weakness    New Prescriptions New Prescriptions   No medications on file     [place], [place] 10/30/16 [place], MD 10/30/16 2003

## 2016-10-30 NOTE — ED Triage Notes (Signed)
Patient is alert and oriented x4 from home.  Patient was sitting in his couch and slid off the edge into the floor.  Patient states that he was unable to get himself up due to weakness.  Currently he states his only pain is his chronic left leg pain.  No noted deformities, swelling or bleeding.  No LOC noted

## 2016-10-31 DIAGNOSIS — R531 Weakness: Secondary | ICD-10-CM

## 2016-10-31 LAB — URINE CULTURE

## 2016-10-31 MED ORDER — HYDROCERIN EX CREA
TOPICAL_CREAM | Freq: Two times a day (BID) | CUTANEOUS | Status: DC
  Administered 2016-10-31 – 2016-11-05 (×11): via TOPICAL
  Filled 2016-10-31 (×2): qty 113

## 2016-10-31 MED ORDER — HYDROCERIN EX CREA: TOPICAL_CREAM | Freq: Every day | CUTANEOUS | Status: DC

## 2016-10-31 MED ORDER — SODIUM CHLORIDE 0.9 % IV BOLUS (SEPSIS)
500.0000 mL | Freq: Once | INTRAVENOUS | Status: AC
  Administered 2016-10-31: 500 mL via INTRAVENOUS

## 2016-10-31 NOTE — Evaluation (Signed)
Physical Therapy Evaluation Patient Details Name: Patrick Orozco MRN: [id number] DOB: 09-27-1956 Today's Date: 10/31/2016   History of Present Illness  60 yo male admitted with L LE cellulitis. Hx of spina bifida, colostomy, chronic bil LE paralysis, COPD, pressure ulcers.   Clinical Impression  On eval, pt required Mod assist for bed mobility and Min-Mod assist for transfer from bed to recliner. Pt tolerated activity well. Pt lives alone and will need ST rehab at a SNF, prior to returning home alone, to increase/improve strength and functional mobility.     Follow Up Recommendations SNF    Equipment Recommendations   (TBD at next venue)    Recommendations for Other Services OT consult     Precautions / Restrictions Precautions Precautions: Fall Precaution Comments: paraplegia Restrictions Weight Bearing Restrictions: No      Mobility  Bed Mobility Overal bed mobility: Needs Assistance Bed Mobility: Supine to Sit     Supine to sit: Mod assist;HOB elevated     General bed mobility comments: Assist for bil LEs.   Transfers Overall transfer level: Needs assistance Equipment used: None Transfers: Squat Pivot Transfers     Squat pivot transfers: Min assist;Mod assist     General transfer comment: Assist to position bil LEs and to scoot back in recliner. Bed to recliner.   Ambulation/Gait             General Gait Details: non ambulatory  Stairs            Wheelchair Mobility    Modified Rankin (Stroke Patients Only)       Balance                                             Pertinent Vitals/Pain Pain Assessment: Faces Faces Pain Scale: Hurts little more Pain Location: abdomen Pain Descriptors / Indicators: Sore Pain Intervention(s): Monitored during session;Repositioned    Home Living Family/patient expects to be discharged to:: Private residence Living Arrangements: Alone Available Help at Discharge: Personal care  attendant (RN and aide, each 2x/week) Type of Home: Apartment Home Access: Ramped entrance     Home Layout: One level Home Equipment: Wheelchair - power;Crutches;Shower seat;Grab bars - tub/shower      Prior Function Level of Independence: Independent with assistive device(s)         Comments: up until a week ago, able to transfer from bed to power chair.      Hand Dominance        Extremity/Trunk Assessment   Upper Extremity Assessment Upper Extremity Assessment: Generalized weakness    Lower Extremity Assessment Lower Extremity Assessment: LLE deficits/detail LLE Deficits / Details: swollen, red    Cervical / Trunk Assessment Cervical / Trunk Assessment: Normal  Communication   Communication: No difficulties  Cognition Arousal/Alertness: Awake/alert Behavior During Therapy: WFL for tasks assessed/performed Overall Cognitive Status: Within Functional Limits for tasks assessed                      General Comments      Exercises     Assessment/Plan    PT Assessment Patient needs continued PT services  PT Problem List Decreased strength;Decreased mobility;Pain;Decreased skin integrity          PT Treatment Interventions DME instruction;Therapeutic activities;Therapeutic exercise;Patient/family education;Functional mobility training;Wheelchair mobility training    PT Goals (Current goals can be found in  the Care Plan section)  Acute Rehab PT Goals Patient Stated Goal: to go to rehab to get stronger PT Goal Formulation: With patient Time For Goal Achievement: 11/14/16 Potential to Achieve Goals: Good    Frequency Min 3X/week   Barriers to discharge        Co-evaluation               End of Session   Activity Tolerance: Patient tolerated treatment well Patient left: in chair;with call bell/phone within reach;with chair alarm set           Time: 8546-2703 PT Time Calculation (min) (ACUTE ONLY): 30 min   Charges:   PT  Evaluation $PT Eval Low Complexity: 1 Procedure     PT G Codes:        Weston Anna, MPT Pager: [phone]

## 2016-10-31 NOTE — Consult Note (Addendum)
[facility] Nurse ostomy consult note Patient known to this writer from a previous admission. Seen on 07/18/16. 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  Oval, pink moist.  Visualized through intact pouch today as pouching system was replaced yesterday. No parastomal skin breakdown evident today and pouch aperture is correctly sized. Stomal assessment/size: 1 and 5/8 inches x 2 and 1/4 inches.  Minimally raised. Peristomal assessment: Not seen today, patient reports no problems. Treatment options for stomal/peristomal skin: Skin barrier ring with next pouch change. Output: Soft brown stool Ostomy pouching: 1pc.flat pouch, Kellie Simmering [phone]. Would add a skin barrier ring Kellie Simmering [phone]) with next pouch change  Education provided: None required Enrolled patient in [place] program: No   [facility] Nurse wound consult note Reason for Consult: Chronic non-healing pressure wounds.  Appear worse than during recent visit (in September). New wound at left posterior thigh, full thickness (Stage 3). MASD in the gluteal cleft and on the buttocks is resolved. No open areas noted. Wound type: Pressure, moisture.  Pressure is due to altered stance when seated, creating increased pressure in the left posterior thigh and left IT Pressure Ulcer POA: Yes Measurement: Left posterior thigh (new) wound: Smooth red, wet wound bed, moderate amount serosanguinous exudate (Stage 3). Currently measures 6.5cm x 11cm x 0.2cm. Left IT near anus: 4.5 x 6cm x 0.2cm with 75% pale pink, 25% yellow wound bed (Stage 3). Moderate amount of light yellow to serous exudate. Left lateral heel:  2cm x 2.5cm deep tissue pressure injury (DTPI), purple/maroon discoloration beneath intact skin.  Non-fluctuant, no induration, no warmth.  LE is edematous (chronic lymphedema) with healed areas of hyperkeratotic tissue. Wound bed: Drainage (amount, consistency, odor) Moderate amount of serosanguinous exudate on old dressing.  Patient had been  sitting up in chair with no pressure redistribution cushion for several hours. Periwound: Macerated, intact Dressing procedure/placement/frequency: I have implemented floatation of heels and painting of the DTPI with a betadine swabstick to keep infection free and dry.  Skin care to the bilateral LEs is to be with Eucerin while in house.  Topical care of the left IT and left posterior thigh will be with silver hydrofiber dressing changed daily and as needed for excess exudate. Patient to limit OOB time in chair (with pressure redistribution chair pad) to 1 hour with every 15 minute position shifts and boosting.  While in bed on his therapeutic mattress with low air loss feature, I have instructed him to keep HOB at or below a 30 degree angle (lower angle as tolerated) except for meals.  He is understanding of need to turn and reposition from side to side. [place] nursing team will not follow, but will remain available to this patient, the nursing and medical teams.  Please re-consult if needed. Thanks, Maudie Flakes, MSN, RN, [place], Arther Abbott  Pager# [phone]

## 2016-10-31 NOTE — Progress Notes (Signed)
PROGRESS NOTE    Patrick Orozco  BTD:[id number] DOB: Jan 24, 1956 DOA: 10/30/2016 PCP: Kristine Garbe, MD    Brief Narrative:  This is a 60 year old male presenting from home with 1 week history of failure to thrive, poor appetite, decreased energy, cough and chills. Initial evaluation revealed elevated lactic acid, otherwise unremarkable laboratory studies. Physical exam notable for left lower extremity erythema. Admitted for cellulitis of the left lower leg, rule out sepsis.  Patient lives at home, he reports for the last week he's had malaise, poor energy, poor appetite. Spending a lot of the time sitting on the couch and has developed some breakdown over his sacrum which is a new issue. He said subjective fever and chills. Some cough but no shortness of breath. No chest pain. No abdominal pain. He did notice his left leg had erythema which is new but he has poor sensation in his legs and has no significant pain. No specific aggravating or alleviating factors noted.  EDP expressed concern for sepsis.  ED Course: temp 100.3, RR 13-28, HR 93-111, SBP 97-126, most recent 97/62, normal sat. BC and UC sent, treated with Zosyn, vancomycin Pertinent labs: CBC, CMP unremarkable. Lactic acid 3.69, troponin 0.01, u/a equivocal..   Assessment & Plan:   Principal Problem:   Left leg cellulitis Active Problems:   History of spina bifida   H/O paraplegia   Pressure ulcer   Generalized weakness   Prolonged Q-T interval on ECG  Left lower extremity cellulitis with associated generalized weakness, failure to thrive. (Only 1 SIRS (HR)) - Elevated lactate <4, likely elevated secondary to poor oral intake and failure to thrive by history - Hypotensive this am but responded to bolus - No evidence of sepsis at this point. - IV antibiotics continued - continue IVF - patient appears stable and not septic  Generalized weakness, likely secondary to poor oral intake and infection - Consult physical  therapy  Prolonged QT - Avoid prolonging agents  - PMH spina bifida, bilateral LE paralysis, sarcodosis, neurogenic bladder - all stable per patient  Pressure ulcer perianal present on admission - patient states this just started - wound care consulted  Coloscomychronic LLE edema - continue to monitor   DVT prophylaxis: Lovenox Code Status: full code Family Communication: none Disposition Plan: likely discharge back to previous home environment with [facility]   Consultants:   Wound Care  Procedures:   None  Antimicrobials:   Unasyn 12/28>  Vancomycin 12/28>   Subjective: Patient sitting in chair about to order lunch.  Voices he feels a little better today than he did yesterday.  Voices that he thinks the redness of his leg is improved as well.  Denies fevers or chills.  Denies shortness of breath, increased work of breathing, chest pain, chest pressure.  Says swelling of left lower extremity is normal for what it is normally.  Objective: Vitals:   10/30/16 1650 10/30/16 1745 10/30/16 2009 10/30/16 2204  BP: 105/73 125/70  100/67  Pulse: 82 87  91  Resp: '22 20  20  '$ Temp:  98.9 F (37.2 C)  98.6 F (37 C)  TempSrc:  Oral  Oral  SpO2: 96% 98% 100% 97%  Weight:  90.7 kg (200 lb)    Height:  '5\' 5"'$  (1.651 m)      Intake/Output Summary (Last 24 hours) at 10/31/16 1518 Last data filed at 10/31/16 0500  Gross per 24 hour  Intake             1130  ml  Output                0 ml  Net             1130 ml   Filed Weights   10/30/16 0824 10/30/16 1745  Weight: 90.7 kg (200 lb) 90.7 kg (200 lb)    Examination:  General exam: Appears calm and comfortable  Respiratory system: Clear to auscultation. Respiratory effort normal. Cardiovascular system: S1 & S2 heard, RRR. No JVD, murmurs, rubs, gallops or clicks. Significant left lower extremity edema Gastrointestinal system: Abdomen is obese, nondistended, soft and nontender. No organomegaly or masses felt. Normal bowel  sounds heard. Central nervous system: Alert and oriented. Bilateral lower extremity weakness Extremities: little to no power in lower extremities.  Erythema of the left thigh that per patient looks slightly improved from yesterday.  Some erythema noted of the left lower leg but minimal.  No increased heat noted Skin: Erythema of the left thigh, No rashes, lesions or ulcers Psychiatry: Judgement and insight appear normal. Mood & affect appropriate.     Data Reviewed: I have personally reviewed following labs and imaging studies  CBC:  Recent Labs Lab 10/30/16 0615  WBC 6.2  NEUTROABS 4.5  HGB 13.3  HCT 40.4  MCV 85.2  PLT 956   Basic Metabolic Panel:  Recent Labs Lab 10/30/16 0615  NA 135  K 3.4*  CL 104  CO2 20*  GLUCOSE 93  BUN 20  CREATININE 1.13  CALCIUM 9.0   GFR: Estimated Creatinine Clearance: 71.1 mL/min (by C-G formula based on SCr of 1.13 mg/dL). Liver Function Tests:  Recent Labs Lab 10/30/16 0615  AST 22  ALT 14*  ALKPHOS 87  BILITOT 0.6  PROT 7.0  ALBUMIN 2.8*   No results for input(s): LIPASE, AMYLASE in the last 168 hours. No results for input(s): AMMONIA in the last 168 hours. Coagulation Profile: No results for input(s): INR, PROTIME in the last 168 hours. Cardiac Enzymes: No results for input(s): CKTOTAL, CKMB, CKMBINDEX, TROPONINI in the last 168 hours. BNP (last 3 results) No results for input(s): PROBNP in the last 8760 hours. HbA1C: No results for input(s): HGBA1C in the last 72 hours. CBG: No results for input(s): GLUCAP in the last 168 hours. Lipid Profile: No results for input(s): CHOL, HDL, LDLCALC, TRIG, CHOLHDL, LDLDIRECT in the last 72 hours. Thyroid Function Tests: No results for input(s): TSH, T4TOTAL, FREET4, T3FREE, THYROIDAB in the last 72 hours. Anemia Panel: No results for input(s): VITAMINB12, FOLATE, FERRITIN, TIBC, IRON, RETICCTPCT in the last 72 hours. Sepsis Labs:  Recent Labs Lab 10/30/16 0718  10/30/16 1410  LATICACIDVEN 3.69* 1.5    Recent Results (from the past 240 hour(s))  Blood Culture (routine x 2)     Status: None (Preliminary result)   Collection Time: 10/30/16  8:17 AM  Result Value Ref Range Status   Specimen Description BLOOD LEFT HAND  Final   Special Requests BOTTLES DRAWN AEROBIC AND ANAEROBIC 5ML  Final   Culture   Final    NO GROWTH 1 DAY Performed at [facility]    Report Status PENDING  Incomplete  Blood Culture (routine x 2)     Status: None (Preliminary result)   Collection Time: 10/30/16  9:15 AM  Result Value Ref Range Status   Specimen Description BLOOD RIGHT HAND  Final   Special Requests IN PEDIATRIC BOTTLE 2CC  Final   Culture   Final    NO GROWTH 1  DAY Performed at [facility]    Report Status PENDING  Incomplete  Urine culture     Status: Abnormal   Collection Time: 10/30/16  9:17 AM  Result Value Ref Range Status   Specimen Description URINE, CLEAN CATCH  Final   Special Requests NONE  Final   Culture MULTIPLE SPECIES PRESENT, SUGGEST RECOLLECTION (A)  Final   Report Status 10/31/2016 FINAL  Final         Radiology Studies: Dg Chest 2 View  Result Date: 10/30/2016 CLINICAL DATA:  Weakness with loss of appetite.  Cough. EXAM: CHEST  2 VIEW COMPARISON:  Chest radiograph January 05, 2013 and chest CT April 06, 2013 FINDINGS: There is no edema or consolidation. Heart size and pulmonary vascular normal. There is persistent soft tissue fullness in the hilar regions as well as more subtly in the paratracheal regions. No bone lesions. IMPRESSION: Evidence of a degree of adenopathy, a finding also present on prior chest CT. Suspect underlying sarcoidosis. A lymphoproliferative disorder could present similarly and is a potential differential consideration. The appearance by radiography is similar to prior studies. There is no edema or consolidation. Electronically Signed   By: Lowella Grip III M.D.   On: 10/30/2016 07:32         Scheduled Meds: .  ceFAZolin (ANCEF) IV  1 g Intravenous Q8H  . enoxaparin (LOVENOX) injection  40 mg Subcutaneous Q24H  . hydrocerin   Topical BID  . metoprolol succinate  50 mg Oral Daily  . mometasone-formoterol  2 puff Inhalation BID  . simvastatin  20 mg Oral QHS   Continuous Infusions: . sodium chloride 75 mL/hr at 10/30/16 1720     LOS: 1 day    Time spent: 30 minutes    Loretha Stapler, MD [facility] Pager [phone]  If 7PM-7AM, please contact night-coverage [email or web address] Password [facility] 10/31/2016, 3:18 PM

## 2016-11-01 LAB — BASIC METABOLIC PANEL
Anion gap: 9 (ref 5–15)
BUN: 10 mg/dL (ref 6–20)
CHLORIDE: 113 mmol/L — AB (ref 101–111)
CO2: 21 mmol/L — AB (ref 22–32)
Calcium: 8.1 mg/dL — ABNORMAL LOW (ref 8.9–10.3)
Creatinine, Ser: 0.81 mg/dL (ref 0.61–1.24)
GFR calc non Af Amer: >60 mL/min (ref >60)
Glucose, Bld: 104 mg/dL — ABNORMAL HIGH (ref 65–99)
POTASSIUM: 3.2 mmol/L — AB (ref 3.5–5.1)
SODIUM: 143 mmol/L (ref 135–145)

## 2016-11-01 LAB — MRSA PCR SCREENING: MRSA BY PCR: NEGATIVE

## 2016-11-01 LAB — MAGNESIUM: MAGNESIUM: 1.8 mg/dL (ref 1.7–2.4)

## 2016-11-01 MED ORDER — POTASSIUM CHLORIDE CRYS ER 20 MEQ PO TBCR
40.0000 meq | EXTENDED_RELEASE_TABLET | Freq: Two times a day (BID) | ORAL | Status: AC
  Administered 2016-11-01 (×2): 40 meq via ORAL
  Filled 2016-11-01 (×2): qty 2

## 2016-11-01 NOTE — Progress Notes (Signed)
PROGRESS NOTE    Patrick Orozco  EXH:[id number] DOB: [birth year] DOA: 10/30/2016 PCP: Kristine Garbe, MD    Brief Narrative:  This is a 60 year old male presenting from home with 1 week history of failure to thrive, poor appetite, decreased energy, cough and chills. Initial evaluation revealed elevated lactic acid, otherwise unremarkable laboratory studies. Physical exam notable for left lower extremity erythema. Admitted for cellulitis of the left lower leg, improving.    Assessment & Plan:   Principal Problem:   Left leg cellulitis Active Problems:   History of spina bifida   H/O paraplegia   Pressure ulcer   Generalized weakness   Prolonged Q-T interval on ECG  Left lower extremity cellulitis with associated generalized weakness, failure to thrive.  - Elevated lactate <4, likely elevated secondary to dehydration/  poor oral intake and failure to thrive by history - No evidence of sepsis at this point. - would recommend another 24 to 48 hours of IV antibiotics.   Generalized weakness, likely secondary to poor oral intake and infection - Consulted physical therapy recommending SNF.   Prolonged QT - Avoid prolonging agents - MONITOR EKG prn.   - PMH spina bifida, bilateral LE paralysis, sarcodosis, neurogenic bladder - all stable per patient  Pressure ulcer perianal present on admission - patient states this just started - wound care consulted and recommendations given.   Coloscomychronic LLE edema - continue to monitor  Hypokalemia: replete as needed.    DVT prophylaxis: Lovenox Code Status: full code Family Communication: none Disposition Plan: SNF when cellulitis improved.    Consultants:   Wound Care  Procedures:   None  Antimicrobials:   Unasyn 12/28>  Vancomycin 12/28>   Subjective: Open to going to rehab. Pain is better. No new complaints.   Objective: Vitals:   10/31/16 1500 10/31/16 2132 11/01/16 0629 11/01/16 1518  BP: 135/66  103/65 (!) 111/58 (!) 127/49  Pulse: 79 74 70 71  Resp: '20 18 20 16  '$ Temp: 97.6 F (36.4 C) 98 F (36.7 C) 98 F (36.7 C) 97.8 F (36.6 C)  TempSrc: Oral Oral Oral Oral  SpO2: 99% 100% 98% 99%  Weight:      Height:        Intake/Output Summary (Last 24 hours) at 11/01/16 1757 Last data filed at 11/01/16 1739  Gross per 24 hour  Intake             1865 ml  Output             1950 ml  Net              -85 ml   Filed Weights   10/30/16 0824 10/30/16 1745  Weight: 90.7 kg (200 lb) 90.7 kg (200 lb)    Examination:  General exam: Appears calm and comfortable  Respiratory system: Clear to auscultation. Respiratory effort normal. Cardiovascular system: S1 & S2 heard, RRR. No JVD, murmurs, rubs, gallops or clicks. Significant left lower extremity edema Gastrointestinal system: Abdomen is obese, nondistended, soft and nontender. No organomegaly or masses felt. Normal bowel sounds heard. Central nervous system: Alert and oriented. Bilateral lower extremity weakness Extremities: little to no power in lower extremities.  Erythema of the left thigh improved. Some erythema noted of the left lower leg but minimal.  No increased heat noted Skin: Erythema of the left thigh, No rashes, lesions or ulcers Psychiatry: Judgement and insight appear normal. Mood & affect appropriate.     Data Reviewed: I have personally reviewed following  labs and imaging studies  CBC:  Recent Labs Lab 10/30/16 0615  WBC 6.2  NEUTROABS 4.5  HGB 13.3  HCT 40.4  MCV 85.2  PLT 212   Basic Metabolic Panel:  Recent Labs Lab 10/30/16 0615 11/01/16 0412  NA 135 143  K 3.4* 3.2*  CL 104 113*  CO2 20* 21*  GLUCOSE 93 104*  BUN 20 10  CREATININE 1.13 0.81  CALCIUM 9.0 8.1*  MG  --  1.8   GFR: Estimated Creatinine Clearance: 99.2 mL/min (by C-G formula based on SCr of 0.81 mg/dL). Liver Function Tests:  Recent Labs Lab 10/30/16 0615  AST 22  ALT 14*  ALKPHOS 87  BILITOT 0.6  PROT 7.0    ALBUMIN 2.8*   No results for input(s): LIPASE, AMYLASE in the last 168 hours. No results for input(s): AMMONIA in the last 168 hours. Coagulation Profile: No results for input(s): INR, PROTIME in the last 168 hours. Cardiac Enzymes: No results for input(s): CKTOTAL, CKMB, CKMBINDEX, TROPONINI in the last 168 hours. BNP (last 3 results) No results for input(s): PROBNP in the last 8760 hours. HbA1C: No results for input(s): HGBA1C in the last 72 hours. CBG: No results for input(s): GLUCAP in the last 168 hours. Lipid Profile: No results for input(s): CHOL, HDL, LDLCALC, TRIG, CHOLHDL, LDLDIRECT in the last 72 hours. Thyroid Function Tests: No results for input(s): TSH, T4TOTAL, FREET4, T3FREE, THYROIDAB in the last 72 hours. Anemia Panel: No results for input(s): VITAMINB12, FOLATE, FERRITIN, TIBC, IRON, RETICCTPCT in the last 72 hours. Sepsis Labs:  Recent Labs Lab 10/30/16 0718 10/30/16 1410  LATICACIDVEN 3.69* 1.5    Recent Results (from the past 240 hour(s))  Blood Culture (routine x 2)     Status: None (Preliminary result)   Collection Time: 10/30/16  8:17 AM  Result Value Ref Range Status   Specimen Description BLOOD LEFT HAND  Final   Special Requests BOTTLES DRAWN AEROBIC AND ANAEROBIC 5ML  Final   Culture   Final    NO GROWTH 2 DAYS Performed at [facility]    Report Status PENDING  Incomplete  Blood Culture (routine x 2)     Status: None (Preliminary result)   Collection Time: 10/30/16  9:15 AM  Result Value Ref Range Status   Specimen Description BLOOD RIGHT HAND  Final   Special Requests IN PEDIATRIC BOTTLE [place]  Final   Culture   Final    NO GROWTH 2 DAYS Performed at [facility]    Report Status PENDING  Incomplete  Urine culture     Status: Abnormal   Collection Time: 10/30/16  9:17 AM  Result Value Ref Range Status   Specimen Description URINE, CLEAN CATCH  Final   Special Requests NONE  Final   Culture MULTIPLE SPECIES PRESENT,  SUGGEST RECOLLECTION (A)  Final   Report Status 10/31/2016 FINAL  Final  MRSA PCR Screening     Status: None   Collection Time: 11/01/16  6:39 AM  Result Value Ref Range Status   MRSA by PCR NEGATIVE NEGATIVE Final    Comment:        The GeneXpert MRSA Assay (FDA approved for NASAL specimens only), is one component of a comprehensive MRSA colonization surveillance program. It is not intended to diagnose MRSA infection nor to guide or monitor treatment for MRSA infections.          Radiology Studies: No results found.      Scheduled Meds: .  ceFAZolin (  ANCEF) IV  1 g Intravenous Q8H  . enoxaparin (LOVENOX) injection  40 mg Subcutaneous Q24H  . hydrocerin   Topical BID  . metoprolol succinate  50 mg Oral Daily  . mometasone-formoterol  2 puff Inhalation BID  . potassium chloride  40 mEq Oral BID  . simvastatin  20 mg Oral QHS   Continuous Infusions: . sodium chloride 75 mL/hr at 11/01/16 0726     LOS: 2 days    Time spent: 30 minutes    Averi Cacioppo, MD [facility] Pager [phone]  If 7PM-7AM, please contact night-coverage [email or web address] Password [facility] 11/01/2016, 5:57 PM

## 2016-11-02 LAB — VITAMIN B12: VITAMIN B 12: 301 pg/mL (ref 180–914)

## 2016-11-02 LAB — CBC
HEMATOCRIT: 30.5 % — AB (ref 39.0–52.0)
HEMOGLOBIN: 9.9 g/dL — AB (ref 13.0–17.0)
MCH: 28 pg (ref 26.0–34.0)
MCHC: 32.5 g/dL (ref 30.0–36.0)
MCV: 86.4 fL (ref 78.0–100.0)
Platelets: 223 10*3/uL (ref 150–400)
RBC: 3.53 MIL/uL — ABNORMAL LOW (ref 4.22–5.81)
RDW: 15.8 % — AB (ref 11.5–15.5)
WBC: 3.4 10*3/uL — AB (ref 4.0–10.5)

## 2016-11-02 LAB — BASIC METABOLIC PANEL
ANION GAP: 6 (ref 5–15)
BUN: 9 mg/dL (ref 6–20)
CALCIUM: 8.1 mg/dL — AB (ref 8.9–10.3)
CO2: 20 mmol/L — AB (ref 22–32)
CREATININE: 0.62 mg/dL (ref 0.61–1.24)
Chloride: 118 mmol/L — ABNORMAL HIGH (ref 101–111)
GFR calc non Af Amer: >60 mL/min (ref >60)
Glucose, Bld: 95 mg/dL (ref 65–99)
Potassium: 4 mmol/L (ref 3.5–5.1)
SODIUM: 144 mmol/L (ref 135–145)

## 2016-11-02 LAB — IRON AND TIBC
IRON: 21 ug/dL — AB (ref 45–182)
Saturation Ratios: 15 % — ABNORMAL LOW (ref 17.9–39.5)
TIBC: 141 ug/dL — ABNORMAL LOW (ref 250–450)
UIBC: 120 ug/dL

## 2016-11-02 LAB — FERRITIN: Ferritin: 406 ng/mL — ABNORMAL HIGH (ref 24–336)

## 2016-11-02 LAB — FOLATE: FOLATE: 9 ng/mL (ref >5.9)

## 2016-11-02 LAB — RETICULOCYTES
RBC.: 3.55 MIL/uL — AB (ref 4.22–5.81)
RETIC COUNT ABSOLUTE: 78.1 10*3/uL (ref 19.0–186.0)
Retic Ct Pct: 2.2 % (ref 0.4–3.1)

## 2016-11-02 LAB — OCCULT BLOOD X 1 CARD TO LAB, STOOL: FECAL OCCULT BLD: NEGATIVE

## 2016-11-02 NOTE — Clinical Social Work Note (Signed)
CSW met with patient at bedside to discuss SNF placement. The patient states that he does not want SNF placement at time of discharge. He reports that he will be returning home with hh services and an aide that has been with him at home. Patient refuses SNF despite it being for short term rehab. Weekend RNCM notified of change in disposition. CSW signing off at this time.   Liz Beach MSW, [place], [place], [phone]

## 2016-11-02 NOTE — Progress Notes (Signed)
PROGRESS NOTE    Clarion T Glanz  [id number]:[id number] DOB: 05/02/1956 DOA: 10/30/2016 PCP: Kristine Garbe, MD    Brief Narrative:  This is a 60 year old male presenting from home with 1 week history of failure to thrive, poor appetite, decreased energy, cough and chills. Initial evaluation revealed elevated lactic acid, otherwise unremarkable laboratory studies. Physical exam notable for left lower extremity erythema. Admitted for cellulitis of the left lower leg, improving.    Assessment & Plan:   Principal Problem:   Left leg cellulitis Active Problems:   History of spina bifida   H/O paraplegia   Pressure ulcer   Generalized weakness   Prolonged Q-T interval on ECG  Left lower extremity cellulitis with associated generalized weakness, failure to thrive.  - Elevated lactate <4, likely elevated secondary to dehydration/  poor oral intake and failure to thrive by history and infection. - No evidence of sepsis at this point. - would recommend another 24 to 48 hours of IV antibiotics for resolution of cellulitis.   Generalized weakness, likely secondary to poor oral intake and infection - Consulted physical therapy recommending SNF, but patient wanted to go home with home health needs.   Prolonged QT - Avoid prolonging agents Repeat EKG show resolution of prolongation of QT .    - PMH spina bifida, bilateral LE paralysis, sarcodosis, neurogenic bladder - all stable per patient  Pressure ulcer perianal present on admission - patient states this just started - wound care consulted and recommendations given.   Coloscomychronic LLE edema - continue to monitor  Hypokalemia: repleted as needed. Repeat level is normal.    DVT prophylaxis: Lovenox Code Status: full code Family Communication: none Disposition Plan: home when cellulitis improved.    Consultants:   Wound Care  Procedures:   None  Antimicrobials:   Unasyn 12/28>  Vancomycin  12/28>   Subjective: Changed his mind today, wants to go back home.   Objective: Vitals:   11/01/16 1935 11/01/16 2140 11/02/16 0658 11/02/16 0742  BP:  136/80 132/63   Pulse:  75 79 75  Resp:  '16 20 17  '$ Temp:  98.2 F (36.8 C) 97.4 F (36.3 C)   TempSrc:  Oral Oral   SpO2: 96% 97% 95% 96%  Weight:      Height:        Intake/Output Summary (Last 24 hours) at 11/02/16 1019 Last data filed at 11/02/16 0711  Gross per 24 hour  Intake          3588.75 ml  Output              950 ml  Net          2638.75 ml   Filed Weights   10/30/16 0824 10/30/16 1745  Weight: 90.7 kg (200 lb) 90.7 kg (200 lb)    Examination:  General exam: Appears calm and comfortable  Respiratory system: Clear to auscultation. Respiratory effort normal. Cardiovascular system: S1 & S2 heard, RRR. No JVD, murmurs, rubs, gallops or clicks. Significant left lower extremity edema Gastrointestinal system: Abdomen is obese, nondistended, soft and nontender. No organomegaly or masses felt. Normal bowel sounds heard. Central nervous system: Alert and oriented. Bilateral lower extremity weakness Extremities: little to no power in lower extremities.  Erythema of the left thigh improved. Some erythema noted of the left lower leg but minimal.  No increased heat noted Skin: Erythema of the left thigh and leg., No rashes, lesions or ulcers Psychiatry: Judgement and insight appear normal. Mood & affect  appropriate.     Data Reviewed: I have personally reviewed following labs and imaging studies  CBC:  Recent Labs Lab 10/30/16 0615 11/02/16 0441  WBC 6.2 3.4*  NEUTROABS 4.5  --   HGB 13.3 9.9*  HCT 40.4 30.5*  MCV 85.2 86.4  PLT 180 518   Basic Metabolic Panel:  Recent Labs Lab 10/30/16 0615 11/01/16 0412 11/02/16 0441  NA 135 143 144  K 3.4* 3.2* 4.0  CL 104 113* 118*  CO2 20* 21* 20*  GLUCOSE 93 104* 95  BUN '20 10 9  '$ CREATININE 1.13 0.81 0.62  CALCIUM 9.0 8.1* 8.1*  MG  --  1.8  --     GFR: Estimated Creatinine Clearance: 100.4 mL/min (by C-G formula based on SCr of 0.62 mg/dL). Liver Function Tests:  Recent Labs Lab 10/30/16 0615  AST 22  ALT 14*  ALKPHOS 87  BILITOT 0.6  PROT 7.0  ALBUMIN 2.8*   No results for input(s): LIPASE, AMYLASE in the last 168 hours. No results for input(s): AMMONIA in the last 168 hours. Coagulation Profile: No results for input(s): INR, PROTIME in the last 168 hours. Cardiac Enzymes: No results for input(s): CKTOTAL, CKMB, CKMBINDEX, TROPONINI in the last 168 hours. BNP (last 3 results) No results for input(s): PROBNP in the last 8760 hours. HbA1C: No results for input(s): HGBA1C in the last 72 hours. CBG: No results for input(s): GLUCAP in the last 168 hours. Lipid Profile: No results for input(s): CHOL, HDL, LDLCALC, TRIG, CHOLHDL, LDLDIRECT in the last 72 hours. Thyroid Function Tests: No results for input(s): TSH, T4TOTAL, FREET4, T3FREE, THYROIDAB in the last 72 hours. Anemia Panel: No results for input(s): VITAMINB12, FOLATE, FERRITIN, TIBC, IRON, RETICCTPCT in the last 72 hours. Sepsis Labs:  Recent Labs Lab 10/30/16 0718 10/30/16 1410  LATICACIDVEN 3.69* 1.5    Recent Results (from the past 240 hour(s))  Blood Culture (routine x 2)     Status: None (Preliminary result)   Collection Time: 10/30/16  8:17 AM  Result Value Ref Range Status   Specimen Description BLOOD LEFT HAND  Final   Special Requests BOTTLES DRAWN AEROBIC AND ANAEROBIC 5ML  Final   Culture   Final    NO GROWTH 2 DAYS Performed at [facility]    Report Status PENDING  Incomplete  Blood Culture (routine x 2)     Status: None (Preliminary result)   Collection Time: 10/30/16  9:15 AM  Result Value Ref Range Status   Specimen Description BLOOD RIGHT HAND  Final   Special Requests IN PEDIATRIC BOTTLE [place]  Final   Culture   Final    NO GROWTH 2 DAYS Performed at [facility]    Report Status PENDING  Incomplete  Urine  culture     Status: Abnormal   Collection Time: 10/30/16  9:17 AM  Result Value Ref Range Status   Specimen Description URINE, CLEAN CATCH  Final   Special Requests NONE  Final   Culture MULTIPLE SPECIES PRESENT, SUGGEST RECOLLECTION (A)  Final   Report Status 10/31/2016 FINAL  Final  MRSA PCR Screening     Status: None   Collection Time: 11/01/16  6:39 AM  Result Value Ref Range Status   MRSA by PCR NEGATIVE NEGATIVE Final    Comment:        The GeneXpert MRSA Assay (FDA approved for NASAL specimens only), is one component of a comprehensive MRSA colonization surveillance program. It is not intended to diagnose MRSA  infection nor to guide or monitor treatment for MRSA infections.          Radiology Studies: No results found.      Scheduled Meds: .  ceFAZolin (ANCEF) IV  1 g Intravenous Q8H  . enoxaparin (LOVENOX) injection  40 mg Subcutaneous Q24H  . hydrocerin   Topical BID  . metoprolol succinate  50 mg Oral Daily  . mometasone-formoterol  2 puff Inhalation BID  . simvastatin  20 mg Oral QHS   Continuous Infusions:    LOS: 3 days    Time spent: 30 minutes    Dominyck Reser, MD [facility] Pager [phone]  If 7PM-7AM, please contact night-coverage [email or web address] Password [email or web address] 11/02/2016, 10:19 AM

## 2016-11-03 NOTE — Progress Notes (Signed)
PROGRESS NOTE    Patrick Orozco  KDT:[id number] DOB: 01/11/1956 DOA: 10/30/2016  PCP: Kristine Garbe, MD   Brief Narrative:  61 year old male presenting from home with 1 week history of failure to thrive, poor appetite, decreased energy, cough and chills. Initial evaluation revealed elevated lactic acid, otherwise unremarkable laboratory studies. Physical exam notable for left lower extremity erythema. Admitted for cellulitis of the left lower leg, overall improving.   Assessment & Plan:   Left lower extremity cellulitis with associated generalized weakness, failure to thrive.  - Elevated lactate <4, secondary to dehydration/  poor oral intake and failure to thrive by history and infection. - No evidence of sepsis at this point. - would recommend another 24 hours of IV antibiotics and transition to PO in AM  Generalized weakness, likely secondary to poor oral intake and infection - Consulted physical therapy recommending SNF, but patient wants to go home with home health needs.   Prolonged QT - Avoid prolonging agents - Repeat EKG show resolution of prolongation of QT .    PMH spina bifida, bilateral LE paralysis, sarcodosis, neurogenic bladder - all stable per patient  Pressure ulcer perianal present on admission - patient states this just started - wound care consulted and recommendations given.   Coloscomychronic LLE edema - continue to monitor  Hypokalemia - supplemented - BMP In AM  Obesity  - Body mass index is 33.28 kg/m.  DVT prophylaxis: Lovenox SQ Code Status: Full  Family Communication: none Disposition Plan: home when cellulitis improved.   Consultants:   Wound Care  Procedures:   None  Antimicrobials:   Unasyn 12/28   Vancomycin 12/28   Ancef 12/28 -->  Subjective: Reports feeling better this AM.   Objective: Vitals:   11/02/16 2056 11/03/16 0537 11/03/16 0932 11/03/16 1046  BP: 126/89 116/65  135/84  Pulse: 70 74    Resp: 18 18     Temp: 97.5 F (36.4 C) 97.8 F (36.6 C)    TempSrc: Oral Oral    SpO2: 98% 100% 94%   Weight:      Height:        Intake/Output Summary (Last 24 hours) at 11/03/16 1148 Last data filed at 11/03/16 0815  Gross per 24 hour  Intake             1340 ml  Output             1350 ml  Net              -10 ml   Filed Weights   10/30/16 0824 10/30/16 1745  Weight: 90.7 kg (200 lb) 90.7 kg (200 lb)    Examination:  General exam: Appears calm and comfortable  Respiratory system: Clear to auscultation. Respiratory effort normal. Cardiovascular system: S1 & S2 heard, RRR. No JVD, murmurs, rubs, gallops or clicks. Significant left lower extremity edema Gastrointestinal system: Abdomen is obese, nondistended, soft and nontender. No organomegaly or masses felt. Normal bowel sounds heard. Central nervous system: Alert and oriented. Bilateral lower extremity weakness Extremities: little to no power in lower extremities.  Erythema of the left thigh improved. Some erythema noted of the left lower leg but minimal.  No increased heat noted Skin: Erythema of the left thigh and leg., No rashes, lesions or ulcers Psychiatry: Judgement and insight appear normal. Mood & affect appropriate.     Data Reviewed: I have personally reviewed following labs and imaging studies  CBC:  Recent Labs Lab 10/30/16 0615 11/02/16 0441  WBC 6.2 3.4*  NEUTROABS 4.5  --   HGB 13.3 9.9*  HCT 40.4 30.5*  MCV 85.2 86.4  PLT 180 858   Basic Metabolic Panel:  Recent Labs Lab 10/30/16 0615 11/01/16 0412 11/02/16 0441  NA 135 143 144  K 3.4* 3.2* 4.0  CL 104 113* 118*  CO2 20* 21* 20*  GLUCOSE 93 104* 95  BUN '20 10 9  '$ CREATININE 1.13 0.81 0.62  CALCIUM 9.0 8.1* 8.1*  MG  --  1.8  --    Liver Function Tests:  Recent Labs Lab 10/30/16 0615  AST 22  ALT 14*  ALKPHOS 87  BILITOT 0.6  PROT 7.0  ALBUMIN 2.8*   Anemia Panel:  Recent Labs  11/02/16 1121  VITAMINB12 301  FOLATE 9.0  FERRITIN  406*  TIBC 141*  IRON 21*  RETICCTPCT 2.2   Sepsis Labs:  Recent Labs Lab 10/30/16 0718 10/30/16 1410  LATICACIDVEN 3.69* 1.5    Recent Results (from the past 240 hour(s))  Blood Culture (routine x 2)     Status: None (Preliminary result)   Collection Time: 10/30/16  8:17 AM  Result Value Ref Range Status   Specimen Description BLOOD LEFT HAND  Final   Special Requests BOTTLES DRAWN AEROBIC AND ANAEROBIC 5ML  Final   Culture   Final    NO GROWTH 3 DAYS Performed at [facility]    Report Status PENDING  Incomplete  Blood Culture (routine x 2)     Status: None (Preliminary result)   Collection Time: 10/30/16  9:15 AM  Result Value Ref Range Status   Specimen Description BLOOD RIGHT HAND  Final   Special Requests IN PEDIATRIC BOTTLE [place]  Final   Culture   Final    NO GROWTH 3 DAYS Performed at [facility]    Report Status PENDING  Incomplete  Urine culture     Status: Abnormal   Collection Time: 10/30/16  9:17 AM  Result Value Ref Range Status   Specimen Description URINE, CLEAN CATCH  Final   Special Requests NONE  Final   Culture MULTIPLE SPECIES PRESENT, SUGGEST RECOLLECTION (A)  Final   Report Status 10/31/2016 FINAL  Final  MRSA PCR Screening     Status: None   Collection Time: 11/01/16  6:39 AM  Result Value Ref Range Status   MRSA by PCR NEGATIVE NEGATIVE Final    Comment:        The GeneXpert MRSA Assay (FDA approved for NASAL specimens only), is one component of a comprehensive MRSA colonization surveillance program. It is not intended to diagnose MRSA infection nor to guide or monitor treatment for MRSA infections.       Radiology Studies: No results found.   Scheduled Meds: .  ceFAZolin (ANCEF) IV  1 g Intravenous Q8H  . enoxaparin (LOVENOX) injection  40 mg Subcutaneous Q24H  . hydrocerin   Topical BID  . metoprolol succinate  50 mg Oral Daily  . mometasone-formoterol  2 puff Inhalation BID  . simvastatin  20 mg Oral QHS     Continuous Infusions:    LOS: 4 days   Time spent: 30 minutes  Faye Ramsay, MD [facility] Pager [phone]  If 7PM-7AM, please contact night-coverage [email or web address] Password [email or web address] 11/03/2016, 11:48 AM

## 2016-11-03 NOTE — Progress Notes (Signed)
Physical Therapy Treatment Patient Details Name: Patrick Orozco MRN: [id number] DOB: 1956/07/28 Today's Date: 11/03/2016    History of Present Illness 61 yo male admitted with L LE cellulitis. Hx of spina bifida, colostomy, chronic bil LE paralysis, COPD, pressure ulcers.     PT Comments    Pt agreeable to bed >chair transfer practice on today. He required increased assistance -Mod assist +2 to successfully complete task. He had trouble shifting weight and clearing bottom on today (possibly due to air mattress somewhat?). Discussed d/c plan-pt states he plans to return home. Based on today's performance, recommendation is still for SNF.    Follow Up Recommendations  SNF (continue to recommend SNF, however pt stated today that he plans to return home)     Equipment Recommendations       Recommendations for Other Services       Precautions / Restrictions Precautions Precautions: Fall Precaution Comments: paraplegia Restrictions Weight Bearing Restrictions: No    Mobility  Bed Mobility Overal bed mobility: Needs Assistance Bed Mobility: Supine to Sit     Supine to sit: HOB elevated;Mod assist     General bed mobility comments: Assist for bil LEs.   Transfers Overall transfer level: Needs assistance Equipment used: None             General transfer comment: Increased assistance required today compared to last session. Pt had difficulty getting leverage on air mattress to allow for clearance of his bottom. 2 attempts needed to get to chair. Had to assist pt with scooting back towards bed center due to being too close to EOB to safely transfer on 1st attempt. Mod assist +2 on 2nd attempt to safely complete transfer.   Ambulation/Gait                 Stairs            Wheelchair Mobility    Modified Rankin (Stroke Patients Only)       Balance                                    Cognition Arousal/Alertness: Awake/alert Behavior  During Therapy: WFL for tasks assessed/performed Overall Cognitive Status: Within Functional Limits for tasks assessed                      Exercises      General Comments        Pertinent Vitals/Pain Pain Assessment: Faces Faces Pain Scale: Hurts little more Pain Location: shoulders Pain Descriptors / Indicators: Sore;Aching Pain Intervention(s): Monitored during session;Repositioned    Home Living                      Prior Function            PT Goals (current goals can now be found in the care plan section) Progress towards PT goals:  (required increased assistance on today)    Frequency    Min 3X/week      PT Plan Current plan remains appropriate    Co-evaluation             End of Session   Activity Tolerance: Patient tolerated treatment well Patient left: in chair;with call bell/phone within reach;with chair alarm set     Time: 1443-1501 PT Time Calculation (min) (ACUTE ONLY): 18 min  Charges:  $Therapeutic Activity: 8-22 mins  G Codes:      Weston Anna, MPT Pager: [phone]

## 2016-11-04 LAB — CBC
HEMATOCRIT: 32 % — AB (ref 39.0–52.0)
Hemoglobin: 10.3 g/dL — ABNORMAL LOW (ref 13.0–17.0)
MCH: 27.9 pg (ref 26.0–34.0)
MCHC: 32.2 g/dL (ref 30.0–36.0)
MCV: 86.7 fL (ref 78.0–100.0)
Platelets: 271 10*3/uL (ref 150–400)
RBC: 3.69 MIL/uL — ABNORMAL LOW (ref 4.22–5.81)
RDW: 15.6 % — AB (ref 11.5–15.5)
WBC: 5 10*3/uL (ref 4.0–10.5)

## 2016-11-04 LAB — CULTURE, BLOOD (ROUTINE X 2)
Culture: NO GROWTH
Culture: NO GROWTH

## 2016-11-04 LAB — BASIC METABOLIC PANEL
ANION GAP: 6 (ref 5–15)
BUN: 10 mg/dL (ref 6–20)
CALCIUM: 8.4 mg/dL — AB (ref 8.9–10.3)
CO2: 24 mmol/L (ref 22–32)
Chloride: 112 mmol/L — ABNORMAL HIGH (ref 101–111)
Creatinine, Ser: 0.62 mg/dL (ref 0.61–1.24)
GFR calc Af Amer: >60 mL/min (ref >60)
GFR calc non Af Amer: >60 mL/min (ref >60)
GLUCOSE: 96 mg/dL (ref 65–99)
Potassium: 3.9 mmol/L (ref 3.5–5.1)
Sodium: 142 mmol/L (ref 135–145)

## 2016-11-04 MED ORDER — ALUM & MAG HYDROXIDE-SIMETH 200-200-20 MG/5ML PO SUSP
30.0000 mL | Freq: Four times a day (QID) | ORAL | Status: DC | PRN
  Administered 2016-11-04: 30 mL via ORAL
  Filled 2016-11-04: qty 30

## 2016-11-04 MED ORDER — JUVEN PO PACK
1.0000 | PACK | Freq: Three times a day (TID) | ORAL | Status: DC
  Administered 2016-11-04 – 2016-11-05 (×3): 1 via ORAL
  Filled 2016-11-04 (×4): qty 1

## 2016-11-04 MED ORDER — CLINDAMYCIN HCL 300 MG PO CAPS
300.0000 mg | ORAL_CAPSULE | Freq: Three times a day (TID) | ORAL | Status: DC
  Administered 2016-11-04 – 2016-11-05 (×3): 300 mg via ORAL
  Filled 2016-11-04 (×4): qty 1

## 2016-11-04 NOTE — Progress Notes (Signed)
Medical advisor requests pt be put into the [place] (High Risk for Readmission) program for home health services. This CM contacted [facility] to let them know pt would be switching to [facility] for [facility] services. Alvis Lemmings made aware and pt informed as well. CM will continue to follow. Marney Doctor RN,BSN,NCM [phone]

## 2016-11-04 NOTE — Progress Notes (Signed)
CSW met with pt at bedside to assist with d/c planning. Pt reports that he wants to go home and have therapy at his home. Pt is declining SNF. CSW will remain available to assist with SNF placement if pt changes his mind.  Werner Lean LCSW [phone]

## 2016-11-04 NOTE — Progress Notes (Signed)
Met with pt at bedside to make sure his plan was still to DC home with [facility] home health services and his personal aide. Pt states his aide is only there 2 hours per day and he knows he needs someone with him more. Pt expressed that he would be willing to look into SNF placement. CSW made aware. CM will continue to follow. Marney Doctor RN,BSN,NCM [phone]

## 2016-11-04 NOTE — Progress Notes (Signed)
PROGRESS NOTE    Patrick Orozco  PYK:[id number] DOB: 1956/09/30 DOA: 10/30/2016  PCP: Kristine Garbe, MD   Brief Narrative:  61 year old male presenting from home with 1 week history of failure to thrive, poor appetite, decreased energy, cough and chills. Initial evaluation revealed elevated lactic acid, otherwise unremarkable laboratory studies. Physical exam notable for left lower extremity erythema. Admitted for cellulitis of the left lower leg, overall improving.   Assessment & Plan:   Left lower extremity cellulitis with associated generalized weakness, failure to thrive.  - Elevated lactate <4, secondary to dehydration/  poor oral intake and failure to thrive by history and infection. - No evidence of sepsis at this point. - today is day #5 of ABX - will change ABX to PO Clindamycin and if pt tolerating well, can be discharged in AM  Generalized weakness, likely secondary to poor oral intake and infection - Consulted physical therapy recommending SNF, but patient wants to go home with home health needs.  - d/w case manager   Prolonged QT - Avoid prolonging agents - Repeat EKG show resolution of prolongation of QT .    PMH spina bifida, bilateral LE paralysis, sarcodosis, neurogenic bladder - all stable per patient  Pressure ulcer perianal present on admission - patient states this just started - wound care consulted and recommendations given.   Coloscomychronic LLE edema - continue to monitor  Hypokalemia - supplemented and WNL this AM  - BMP In AM  Obesity  - Body mass index is 33.28 kg/m.  DVT prophylaxis: Lovenox SQ Code Status: Full  Family Communication: none Disposition Plan: home in AM if tolerating oral ABX well   Consultants:   Wound Care  Procedures:   None  Antimicrobials:   Unasyn 12/28   Vancomycin 12/28   Ancef 12/28 --> 11/04/2016  Clindamycin 11/04/2016 -->  Subjective: Reports feeling better this AM.   Objective: Vitals:   11/03/16 2327 11/04/16 0607 11/04/16 0935 11/04/16 1506  BP: 126/77 126/72 (!) 148/86 (!) 143/84  Pulse: 79 77 72 66  Resp: '16 16  16  '$ Temp: 98 F (36.7 C) 99.2 F (37.3 C)  97 F (36.1 C)  TempSrc: Oral Oral  Oral  SpO2: 97% 97%  98%  Weight:      Height:        Intake/Output Summary (Last 24 hours) at 11/04/16 1708 Last data filed at 11/04/16 1055  Gross per 24 hour  Intake              500 ml  Output             1700 ml  Net            -1200 ml   Filed Weights   10/30/16 0824 10/30/16 1745  Weight: 90.7 kg (200 lb) 90.7 kg (200 lb)    Examination:  General exam: Appears calm and comfortable  Respiratory system: Clear to auscultation. Respiratory effort normal. Cardiovascular system: S1 & S2 heard, RRR. No JVD, murmurs, rubs, gallops or clicks. Significant left lower extremity edema Gastrointestinal system: Abdomen is obese, nondistended, soft and nontender. No organomegaly or masses felt. Normal bowel sounds heard. Central nervous system: Alert and oriented. Bilateral lower extremity weakness Extremities: little to no power in lower extremities.  Erythema of the left thigh improved. Some erythema noted of the left lower leg but minimal.  No TTP Skin: Erythema of the left thigh and leg., No rashes, lesions or ulcers Psychiatry: Judgement and insight appear normal. Mood &  affect appropriate.     Data Reviewed: I have personally reviewed following labs and imaging studies  CBC:  Recent Labs Lab 10/30/16 0615 11/02/16 0441 11/04/16 0357  WBC 6.2 3.4* 5.0  NEUTROABS 4.5  --   --   HGB 13.3 9.9* 10.3*  HCT 40.4 30.5* 32.0*  MCV 85.2 86.4 86.7  PLT 180 223 967   Basic Metabolic Panel:  Recent Labs Lab 10/30/16 0615 11/01/16 0412 11/02/16 0441 11/04/16 0357  NA 135 143 144 142  K 3.4* 3.2* 4.0 3.9  CL 104 113* 118* 112*  CO2 20* 21* 20* 24  GLUCOSE 93 104* 95 96  BUN '20 10 9 10  '$ CREATININE 1.13 0.81 0.62 0.62  CALCIUM 9.0 8.1* 8.1* 8.4*  MG  --  1.8   --   --    Liver Function Tests:  Recent Labs Lab 10/30/16 0615  AST 22  ALT 14*  ALKPHOS 87  BILITOT 0.6  PROT 7.0  ALBUMIN 2.8*   Anemia Panel:  Recent Labs  11/02/16 1121  VITAMINB12 301  FOLATE 9.0  FERRITIN 406*  TIBC 141*  IRON 21*  RETICCTPCT 2.2   Sepsis Labs:  Recent Labs Lab 10/30/16 0718 10/30/16 1410  LATICACIDVEN 3.69* 1.5    Recent Results (from the past 240 hour(s))  Blood Culture (routine x 2)     Status: None   Collection Time: 10/30/16  8:17 AM  Result Value Ref Range Status   Specimen Description BLOOD LEFT HAND  Final   Special Requests BOTTLES DRAWN AEROBIC AND ANAEROBIC 5ML  Final   Culture   Final    NO GROWTH 5 DAYS Performed at [facility]    Report Status 11/04/2016 FINAL  Final  Blood Culture (routine x 2)     Status: None   Collection Time: 10/30/16  9:15 AM  Result Value Ref Range Status   Specimen Description BLOOD RIGHT HAND  Final   Special Requests IN PEDIATRIC BOTTLE [place]  Final   Culture   Final    NO GROWTH 5 DAYS Performed at [facility]    Report Status 11/04/2016 FINAL  Final  Urine culture     Status: Abnormal   Collection Time: 10/30/16  9:17 AM  Result Value Ref Range Status   Specimen Description URINE, CLEAN CATCH  Final   Special Requests NONE  Final   Culture MULTIPLE SPECIES PRESENT, SUGGEST RECOLLECTION (A)  Final   Report Status 10/31/2016 FINAL  Final  MRSA PCR Screening     Status: None   Collection Time: 11/01/16  6:39 AM  Result Value Ref Range Status   MRSA by PCR NEGATIVE NEGATIVE Final    Comment:        The GeneXpert MRSA Assay (FDA approved for NASAL specimens only), is one component of a comprehensive MRSA colonization surveillance program. It is not intended to diagnose MRSA infection nor to guide or monitor treatment for MRSA infections.       Radiology Studies: No results found.   Scheduled Meds: .  ceFAZolin (ANCEF) IV  1 g Intravenous Q8H  . enoxaparin  (LOVENOX) injection  40 mg Subcutaneous Q24H  . hydrocerin   Topical BID  . metoprolol succinate  50 mg Oral Daily  . mometasone-formoterol  2 puff Inhalation BID  . nutrition supplement (JUVEN)  1 packet Oral TID  . simvastatin  20 mg Oral QHS   Continuous Infusions:   LOS: 5 days   Time spent: 30  minutes  Faye Ramsay, MD [facility] Pager [phone]  If 7PM-7AM, please contact night-coverage [email or web address] Password [email or web address] 11/04/2016, 5:08 PM

## 2016-11-04 NOTE — Progress Notes (Signed)
Initial Nutrition Assessment  DOCUMENTATION CODES:   Obesity unspecified  INTERVENTION:   Provide Juven protein supplements TID, each provides 80 kcal and 14g protein. Encourage PO intake, emphasizing protein consumption RD to continue to monitor  NUTRITION DIAGNOSIS:   Increased nutrient needs related to wound healing as evidenced by estimated needs.  GOAL:   Patient will meet greater than or equal to 90% of their needs  MONITOR:   PO intake, Supplement acceptance, Labs, Weight trends, I & O's, Skin  REASON FOR ASSESSMENT:   Low Braden    ASSESSMENT:   61 year old male presenting from home with 1 week history of failure to thrive, poor appetite, decreased energy, cough and chills. Initial evaluation revealed elevated lactic acid, otherwise unremarkable laboratory studies. Physical exam notable for left lower extremity erythema. Admitted for cellulitis of the left lower leg, overall improving.   Patient reports eating well with good appetite. Pt has been consuming 100% x multiple meals. For lunch today he has meatloas w/ mashed potatoes and broccoli. Pt with cellulitis and multiple wounds/pressure injuries. Pt would benefit from Juven protein supplement with his good PO intake.   Pt has lost 30 lb since July 2017 (13% x 6 months, significant for time frame). Nutrition focused physical exam shows no sign of depletion of muscle mass or body fat.  Medications reviewed. Labs reviewed: Vitamin B-12 WNL  Diet Order:  Diet Heart Room service appropriate? Yes; Fluid consistency: Thin  Skin:   ankle blister, stage II sacral and stage III thigh pressure injuries, DTI on heel  Last BM:  12/31  Height:   Ht Readings from Last 1 Encounters:  10/30/16 '5\' 5"'$  (1.651 m)    Weight:   Wt Readings from Last 1 Encounters:  10/30/16 200 lb (90.7 kg)    Ideal Body Weight:  61.8 kg  BMI:  Body mass index is 33.28 kg/m.  Estimated Nutritional Needs:   Kcal:   2050-2250  Protein:  90-100g  Fluid:  2-2.2L/day  EDUCATION NEEDS:   Education needs addressed  Clayton Bibles, MS, RD, LDN Pager: [phone] After Hours Pager: [phone]

## 2016-11-05 LAB — BASIC METABOLIC PANEL
Anion gap: 4 — ABNORMAL LOW (ref 5–15)
BUN: 16 mg/dL (ref 6–20)
CALCIUM: 8.6 mg/dL — AB (ref 8.9–10.3)
CHLORIDE: 110 mmol/L (ref 101–111)
CO2: 25 mmol/L (ref 22–32)
CREATININE: 0.79 mg/dL (ref 0.61–1.24)
GFR calc Af Amer: >60 mL/min (ref >60)
Glucose, Bld: 90 mg/dL (ref 65–99)
Potassium: 4.6 mmol/L (ref 3.5–5.1)
SODIUM: 139 mmol/L (ref 135–145)

## 2016-11-05 LAB — CBC
HCT: 32.1 % — ABNORMAL LOW (ref 39.0–52.0)
Hemoglobin: 10.3 g/dL — ABNORMAL LOW (ref 13.0–17.0)
MCH: 28.1 pg (ref 26.0–34.0)
MCHC: 32.1 g/dL (ref 30.0–36.0)
MCV: 87.5 fL (ref 78.0–100.0)
PLATELETS: 258 10*3/uL (ref 150–400)
RBC: 3.67 MIL/uL — ABNORMAL LOW (ref 4.22–5.81)
RDW: 15.9 % — AB (ref 11.5–15.5)
WBC: 4.8 10*3/uL (ref 4.0–10.5)

## 2016-11-05 MED ORDER — CLINDAMYCIN HCL 300 MG PO CAPS: 300.0000 mg | ORAL_CAPSULE | Freq: Three times a day (TID) | ORAL | 0 refills | Status: DC

## 2016-11-05 MED ORDER — ACETAMINOPHEN 325 MG PO TABS: 650.0000 mg | ORAL_TABLET | Freq: Four times a day (QID) | ORAL | 0 refills | Status: DC | PRN

## 2016-11-05 MED ORDER — ALUM & MAG HYDROXIDE-SIMETH 200-200-20 MG/5ML PO SUSP: 30.0000 mL | Freq: Four times a day (QID) | ORAL | 0 refills | Status: DC | PRN

## 2016-11-05 MED ORDER — HYDROCERIN EX CREA: 1.0000 "application " | TOPICAL_CREAM | Freq: Two times a day (BID) | CUTANEOUS | 0 refills | Status: DC

## 2016-11-05 MED ORDER — JUVEN PO PACK: 1.0000 | PACK | Freq: Three times a day (TID) | ORAL | 0 refills | Status: DC

## 2016-11-05 NOTE — Progress Notes (Signed)
Discharge instructions given to patient, verbalized understanding. Denies pain. Has condom cath to leg bag. Colostomy bag changed this am, clean dry and intact. Per patient his pharmacy will deliver his home meds. Follow up appointment tom. with wound center discussed with the patient.

## 2016-11-05 NOTE — Progress Notes (Signed)
Patient d/c to home via [facility]. Patient is stable.

## 2016-11-05 NOTE — Progress Notes (Signed)
[facility] called to transport resident to home. Will notify patient.

## 2016-11-05 NOTE — Discharge Summary (Signed)
Physician Discharge Summary  Patrick Orozco DXI:[id number] DOB: 31-May-1956 DOA: 10/30/2016  PCP: Kristine Garbe, MD  Admit date: 10/30/2016 Discharge date: 11/05/2016  Admitted From: Home  Disposition:  Home with [facility] (high Risk for Readmission)  Recommendations for Outpatient Follow-up:  1. Follow up with PCP in 1-2 weeks 2. Follow up with Wound Care 3. Please obtain BMP/CBC in one week   Home Health: YES - PT/OT/RN/Social Work Equipment/Devices:  Discharge Condition: Stable CODE STATUS: FULL Diet recommendation: Heart Healthy   Brief/Interim Summary: 61 year old male presenting from home with 1 week history of failure to thrive, poor appetite, decreased energy, cough and chills. Initial evaluation revealed elevated lactic acid, otherwise unremarkable laboratory studies. Physical exam notable for left lower extremity erythema. Admitted for cellulitis of the left lower leg, overall improving. Transitioned to po Abx. PT recommended SNF but patient wants to go home with Home Health. Case Worker involved and [place]. Patient to follow up with Wound Care at D/C. Follow up with PCP as well as patient was deemed medically stable to D/C today.   Discharge Diagnoses:  Principal Problem:   Left leg cellulitis Active Problems:   History of spina bifida   H/O paraplegia   Pressure ulcer   Generalized weakness   Prolonged Q-T interval on ECG  Left lower extremity cellulitis with associated generalized weakness, failure to thrive. - Elevated lactate <4, secondary to dehydration/  poor oral intake and failure to thrive by history and infection. - No evidence of sepsis at this point. -Changed IV abx to po for completion of course  Generalized weakness, likely secondary to poor oral intake and infection - Consulted physical therapy recommending SNF, but patient wants to go home with home health needs.  - d/w case manager and High Risk for Readmission Home health set  up  Prolonged QT - Avoid prolonging agents - Repeat EKG showed resolution of prolongation of QT .    PMH spina bifida, bilateral LE paralysis, sarcodosis, neurogenic bladder - all stable per patient - Home Health PT/OT and RN  Pressure ulcer perianalpresent on admission - patient states this just started - wound care consulted and recommendations given.  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  Discharge Instructions  Discharge Instructions    Call MD for:  difficulty breathing, headache or visual disturbances    Complete by:  As directed    Call MD for:  extreme fatigue    Complete by:  As directed    Call MD for:  persistant dizziness or light-headedness    Complete by:  As directed    Call MD for:  persistant nausea and vomiting    Complete by:  As directed    Call MD for:  redness, tenderness, or signs of infection (pain, swelling, redness, odor or green/yellow discharge around incision site)    Complete by:  As directed    Call MD for:  severe uncontrolled pain    Complete by:  As directed    Call MD for:  temperature >100.4    Complete by:  As directed    Diet - low sodium heart healthy    Complete by:  As directed    Increase activity slowly    Complete by:  As directed      Allergies as of  11/05/2016      Reactions   Ivp Dye [iodinated Diagnostic Agents] Itching      Medication List    STOP taking these medications   docusate sodium 100 MG capsule Commonly known as:  COLACE   ondansetron 4 MG disintegrating tablet Commonly known as:  ZOFRAN ODT   oxyCODONE-acetaminophen 5-325 MG tablet Commonly known as:  PERCOCET/ROXICET     TAKE these medications   acetaminophen 325 MG tablet Commonly known as:  TYLENOL Take 2 tablets (650 mg total) by mouth every 6 (six) hours as needed for mild pain (or Fever  >/= 101).   alum & mag hydroxide-simeth 200-200-20 MG/5ML suspension Commonly known as:  MAALOX/MYLANTA Take 30 mLs by mouth every 6 (six) hours as needed for indigestion or heartburn.   clindamycin 300 MG capsule Commonly known as:  CLEOCIN Take 1 capsule (300 mg total) by mouth 3 (three) times daily.   Fluticasone-Salmeterol 250-50 MCG/DOSE Aepb Commonly known as:  ADVAIR Inhale 1 puff into the lungs 2 (two) times daily as needed (shortness of breath).   hydrocerin Crea Apply 1 application topically 2 (two) times daily.   metoprolol succinate 50 MG 24 hr tablet Commonly known as:  TOPROL-XL Take 50 mg by mouth daily.   nutrition supplement (JUVEN) Pack Take 1 packet by mouth 3 (three) times daily.   simvastatin 20 MG tablet Commonly known as:  ZOCOR Take 20 mg by mouth at bedtime.      Follow-up Information    [facility] Follow up.   Specialty:  Home Health Services Contact information: [place] [address] [phone]          Allergies  Allergen Reactions  . Ivp Dye [Iodinated Diagnostic Agents] Itching    Consultations:  Wound Care  Case Managment  Procedures/Studies: Dg Chest 2 View  Result Date: 10/30/2016 CLINICAL DATA:  Weakness with loss of appetite.  Cough. EXAM: CHEST  2 VIEW COMPARISON:  Chest radiograph January 05, 2013 and chest CT April 06, 2013 FINDINGS: There is no edema or consolidation. Heart size and pulmonary vascular normal. There is persistent soft tissue fullness in the hilar regions as well as more subtly in the paratracheal regions. No bone lesions. IMPRESSION: Evidence of a degree of adenopathy, a finding also present on prior chest CT. Suspect underlying sarcoidosis. A lymphoproliferative disorder could present similarly and is a potential differential consideration. The appearance by radiography is similar to prior studies. There is no edema or consolidation. Electronically Signed   By: Lowella Grip III M.D.   On: 10/30/2016 07:32    Subjective: Seen and examined at bedside and doing better. No N/V. Wants to go home.   Discharge Exam: Vitals:   11/05/16 0606 11/05/16 0853  BP: 136/76 138/87  Pulse: 66 61  Resp: 18   Temp: 97.7 F (36.5 C)    Vitals:   11/04/16 2148 11/05/16 0606 11/05/16 0848 11/05/16 0853  BP: 124/73 136/76  138/87  Pulse: 65 66  61  Resp: 17 18    Temp: 97.8 F (36.6 C) 97.7 F (36.5 C)    TempSrc: Oral Oral    SpO2: 99% 98% 96%   Weight:      Height:       General: Pt is alert, awake, not in acute distress Cardiovascular: RRR, S1/S2 +, no rubs, no gallops Respiratory: CTA bilaterally, no wheezing, no rhonchi Abdominal: Soft, NT, ND, bowel sounds +; Colostomy in place with stool  ouput Extremities: LLE mild edema and erythema, no cyanosis Gu: Foley in place.  The results of significant diagnostics from this hospitalization (including imaging, microbiology, ancillary and laboratory) are listed below for reference.    Microbiology: Recent Results (from the past 240 hour(s))  Blood Culture (routine x 2)     Status: None   Collection Time: 10/30/16  8:17 AM  Result Value Ref Range Status   Specimen Description BLOOD LEFT HAND  Final   Special Requests BOTTLES DRAWN AEROBIC AND ANAEROBIC 5ML  Final   Culture   Final    NO GROWTH 5 DAYS Performed at [facility]    Report Status 11/04/2016 FINAL  Final  Blood Culture (routine x 2)     Status: None   Collection Time: 10/30/16  9:15 AM  Result Value Ref Range Status   Specimen Description BLOOD RIGHT HAND  Final   Special Requests IN PEDIATRIC BOTTLE [place]  Final   Culture   Final    NO GROWTH 5 DAYS Performed at [facility]    Report Status 11/04/2016 FINAL  Final  Urine culture     Status: Abnormal   Collection Time: 10/30/16  9:17 AM  Result Value Ref Range Status   Specimen Description URINE, CLEAN CATCH  Final   Special Requests NONE  Final   Culture MULTIPLE  SPECIES PRESENT, SUGGEST RECOLLECTION (A)  Final   Report Status 10/31/2016 FINAL  Final  MRSA PCR Screening     Status: None   Collection Time: 11/01/16  6:39 AM  Result Value Ref Range Status   MRSA by PCR NEGATIVE NEGATIVE Final    Comment:        The GeneXpert MRSA Assay (FDA approved for NASAL specimens only), is one component of a comprehensive MRSA colonization surveillance program. It is not intended to diagnose MRSA infection nor to guide or monitor treatment for MRSA infections.     Labs: BNP (last 3 results) No results for input(s): BNP in the last 8760 hours. Basic Metabolic Panel:  Recent Labs Lab 10/30/16 0615 11/01/16 0412 11/02/16 0441 11/04/16 0357 11/05/16 0420  NA 135 143 144 142 139  K 3.4* 3.2* 4.0 3.9 4.6  CL 104 113* 118* 112* 110  CO2 20* 21* 20* 24 25  GLUCOSE 93 104* 95 96 90  BUN '20 10 9 10 16  '$ CREATININE 1.13 0.81 0.62 0.62 0.79  CALCIUM 9.0 8.1* 8.1* 8.4* 8.6*  MG  --  1.8  --   --   --    Liver Function Tests:  Recent Labs Lab 10/30/16 0615  AST 22  ALT 14*  ALKPHOS 87  BILITOT 0.6  PROT 7.0  ALBUMIN 2.8*   No results for input(s): LIPASE, AMYLASE in the last 168 hours. No results for input(s): AMMONIA in the last 168 hours. CBC:  Recent Labs Lab 10/30/16 0615 11/02/16 0441 11/04/16 0357 11/05/16 0420  WBC 6.2 3.4* 5.0 4.8  NEUTROABS 4.5  --   --   --   HGB 13.3 9.9* 10.3* 10.3*  HCT 40.4 30.5* 32.0* 32.1*  MCV 85.2 86.4 86.7 87.5  PLT 180 223 271 258   Cardiac Enzymes: No results for input(s): CKTOTAL, CKMB, CKMBINDEX, TROPONINI in the last 168 hours. BNP: Invalid input(s): POCBNP CBG: No results for input(s): GLUCAP in the last 168 hours. D-Dimer No results for input(s): DDIMER in the last 72 hours. Hgb A1c No results for input(s): HGBA1C in the last 72 hours. Lipid Profile  No results for input(s): CHOL, HDL, LDLCALC, TRIG, CHOLHDL, LDLDIRECT in the last 72 hours. Thyroid function studies No results for  input(s): TSH, T4TOTAL, T3FREE, THYROIDAB in the last 72 hours.  Invalid input(s): FREET3 Anemia work up No results for input(s): VITAMINB12, FOLATE, FERRITIN, TIBC, IRON, RETICCTPCT in the last 72 hours. Urinalysis    Component Value Date/Time   COLORURINE YELLOW 10/30/2016 0917   APPEARANCEUR HAZY (A) 10/30/2016 0917   LABSPEC 1.016 10/30/2016 0917   PHURINE 7.0 10/30/2016 0917   GLUCOSEU NEGATIVE 10/30/2016 0917   HGBUR NEGATIVE 10/30/2016 0917   BILIRUBINUR NEGATIVE 10/30/2016 0917   KETONESUR NEGATIVE 10/30/2016 0917   PROTEINUR NEGATIVE 10/30/2016 0917   UROBILINOGEN 1.0 06/15/2015 1840   NITRITE NEGATIVE 10/30/2016 0917   LEUKOCYTESUR LARGE (A) 10/30/2016 0917   Sepsis Labs Invalid input(s): PROCALCITONIN,  WBC,  LACTICIDVEN Microbiology Recent Results (from the past 240 hour(s))  Blood Culture (routine x 2)     Status: None   Collection Time: 10/30/16  8:17 AM  Result Value Ref Range Status   Specimen Description BLOOD LEFT HAND  Final   Special Requests BOTTLES DRAWN AEROBIC AND ANAEROBIC 5ML  Final   Culture   Final    NO GROWTH 5 DAYS Performed at [facility]    Report Status 11/04/2016 FINAL  Final  Blood Culture (routine x 2)     Status: None   Collection Time: 10/30/16  9:15 AM  Result Value Ref Range Status   Specimen Description BLOOD RIGHT HAND  Final   Special Requests IN PEDIATRIC BOTTLE [place]  Final   Culture   Final    NO GROWTH 5 DAYS Performed at [facility]    Report Status 11/04/2016 FINAL  Final  Urine culture     Status: Abnormal   Collection Time: 10/30/16  9:17 AM  Result Value Ref Range Status   Specimen Description URINE, CLEAN CATCH  Final   Special Requests NONE  Final   Culture MULTIPLE SPECIES PRESENT, SUGGEST RECOLLECTION (A)  Final   Report Status 10/31/2016 FINAL  Final  MRSA PCR Screening     Status: None   Collection Time: 11/01/16  6:39 AM  Result Value Ref Range Status   MRSA by PCR NEGATIVE NEGATIVE Final     Comment:        The GeneXpert MRSA Assay (FDA approved for NASAL specimens only), is one component of a comprehensive MRSA colonization surveillance program. It is not intended to diagnose MRSA infection nor to guide or monitor treatment for MRSA infections.    Time coordinating discharge: Over 30 minutes  SIGNED:  Kerney Elbe, DO [facility] 11/05/2016, 12:35 PM Pager [phone]  If 7PM-7AM, please contact night-coverage [email or web address] Password [email or web address]

## 2016-11-05 NOTE — Care Management Important Message (Signed)
Important Message  Patient Details  Name: Patrick Orozco MRN: [id number] Date of Birth: Jun 03, 1956   Medicare Important Message Given:  Yes    Kerin Salen 11/05/2016, 11:43 AMImportant Message  Patient Details  Name: Patrick Orozco MRN: [id number] Date of Birth: 10-Feb-1956   Medicare Important Message Given:  Yes    Kerin Salen 11/05/2016, 11:43 AM

## 2016-11-05 NOTE — Progress Notes (Signed)
Pt to dc home via ambulance. Medical Necessity Form filled out and printed along with Demographics sheet. RN to call [facility] when discharge written and pt ready to transport home. No other CM needs communicated. Marney Doctor RN,BSN,NCM [phone]

## 2016-11-06 ENCOUNTER — Encounter (HOSPITAL_BASED_OUTPATIENT_CLINIC_OR_DEPARTMENT_OTHER): Payer: Medicare Other | Attending: Internal Medicine

## 2016-11-09 ENCOUNTER — Emergency Department (HOSPITAL_COMMUNITY): Payer: Medicare HMO

## 2016-11-09 ENCOUNTER — Encounter (HOSPITAL_COMMUNITY): Payer: Self-pay | Admitting: Emergency Medicine

## 2016-11-09 ENCOUNTER — Emergency Department (HOSPITAL_COMMUNITY)
Admission: EM | Admit: 2016-11-09 | Discharge: 2016-11-09 | Disposition: A | Discharge status: Home / Self Care | Payer: Medicare HMO | Attending: Emergency Medicine | Admitting: Emergency Medicine

## 2016-11-09 DIAGNOSIS — L89321 Pressure ulcer of left buttock, stage 1: Secondary | ICD-10-CM | POA: Insufficient documentation

## 2016-11-09 DIAGNOSIS — N3000 Acute cystitis without hematuria: Secondary | ICD-10-CM | POA: Insufficient documentation

## 2016-11-09 DIAGNOSIS — R531 Weakness: Secondary | ICD-10-CM | POA: Insufficient documentation

## 2016-11-09 DIAGNOSIS — I10 Essential (primary) hypertension: Secondary | ICD-10-CM | POA: Insufficient documentation

## 2016-11-09 DIAGNOSIS — Z87891 Personal history of nicotine dependence: Secondary | ICD-10-CM | POA: Diagnosis not present

## 2016-11-09 DIAGNOSIS — L89329 Pressure ulcer of left buttock, unspecified stage: Secondary | ICD-10-CM | POA: Diagnosis present

## 2016-11-09 LAB — URINALYSIS, ROUTINE W REFLEX MICROSCOPIC
BILIRUBIN URINE: NEGATIVE
Glucose, UA: NEGATIVE mg/dL
Ketones, ur: NEGATIVE mg/dL
Nitrite: NEGATIVE
PH: 5.5 (ref 5.0–8.0)
Protein, ur: NEGATIVE mg/dL
SPECIFIC GRAVITY, URINE: 1.02 (ref 1.005–1.030)

## 2016-11-09 LAB — COMPREHENSIVE METABOLIC PANEL
ALBUMIN: 2.7 g/dL — AB (ref 3.5–5.0)
ALK PHOS: 79 U/L (ref 38–126)
ALT: 9 U/L — AB (ref 17–63)
AST: 16 U/L (ref 15–41)
Anion gap: 8 (ref 5–15)
BILIRUBIN TOTAL: 0.3 mg/dL (ref 0.3–1.2)
BUN: 11 mg/dL (ref 6–20)
CALCIUM: 8.4 mg/dL — AB (ref 8.9–10.3)
CO2: 21 mmol/L — AB (ref 22–32)
Chloride: 106 mmol/L (ref 101–111)
Creatinine, Ser: 0.85 mg/dL (ref 0.61–1.24)
GFR calc Af Amer: >60 mL/min (ref >60)
GFR calc non Af Amer: >60 mL/min (ref >60)
Glucose, Bld: 77 mg/dL (ref 65–99)
Potassium: 4.2 mmol/L (ref 3.5–5.1)
SODIUM: 135 mmol/L (ref 135–145)
TOTAL PROTEIN: 7.5 g/dL (ref 6.5–8.1)

## 2016-11-09 LAB — CBC WITH DIFFERENTIAL/PLATELET
BASOS ABS: 0 10*3/uL (ref 0.0–0.1)
BASOS PCT: 0 %
EOS ABS: 0.1 10*3/uL (ref 0.0–0.7)
Eosinophils Relative: 3 %
HEMATOCRIT: 33.2 % — AB (ref 39.0–52.0)
HEMOGLOBIN: 10.3 g/dL — AB (ref 13.0–17.0)
Lymphocytes Relative: 19 %
Lymphs Abs: 1 10*3/uL (ref 0.7–4.0)
MCH: 27.5 pg (ref 26.0–34.0)
MCHC: 31 g/dL (ref 30.0–36.0)
MCV: 88.8 fL (ref 78.0–100.0)
Monocytes Absolute: 0.5 10*3/uL (ref 0.1–1.0)
Monocytes Relative: 10 %
NEUTROS ABS: 3.5 10*3/uL (ref 1.7–7.7)
NEUTROS PCT: 68 %
Platelets: 211 10*3/uL (ref 150–400)
RBC: 3.74 MIL/uL — AB (ref 4.22–5.81)
RDW: 15.7 % — ABNORMAL HIGH (ref 11.5–15.5)
WBC: 5.2 10*3/uL (ref 4.0–10.5)

## 2016-11-09 LAB — URINALYSIS, MICROSCOPIC (REFLEX)

## 2016-11-09 LAB — I-STAT CG4 LACTIC ACID, ED: Lactic Acid, Venous: 2.34 mmol/L (ref 0.5–1.9)

## 2016-11-09 MED ORDER — SODIUM CHLORIDE 0.9 % IV BOLUS (SEPSIS)
1000.0000 mL | Freq: Once | INTRAVENOUS | Status: AC
  Administered 2016-11-09: 1000 mL via INTRAVENOUS

## 2016-11-09 MED ORDER — CEPHALEXIN 500 MG PO CAPS: 500.0000 mg | ORAL_CAPSULE | Freq: Four times a day (QID) | ORAL | 0 refills | Status: DC

## 2016-11-09 MED ORDER — CEPHALEXIN 500 MG PO CAPS
500.0000 mg | ORAL_CAPSULE | Freq: Once | ORAL | Status: AC
  Administered 2016-11-09: 500 mg via ORAL
  Filled 2016-11-09: qty 1

## 2016-11-09 NOTE — ED Notes (Signed)
[facility] called for transport.  

## 2016-11-09 NOTE — ED Notes (Signed)
Patient was alert, oriented and stable upon discharge. RN went over AVS and patient had no further questions.  

## 2016-11-09 NOTE — ED Notes (Signed)
Bed: SR15 Expected date: 11/09/16 Expected time: 1:48 PM Means of arrival: Ambulance Comments: Decubitis worsening

## 2016-11-09 NOTE — ED Notes (Signed)
Delay on EKG pt is in xray

## 2016-11-09 NOTE — Care Management Note (Addendum)
Case Management Note  Patient Details  Name: Patrick Orozco MRN: [id number] Date of Birth: Mar 09, 1956  Subjective/Objective:    Weakness                 Action/Plan: Discharge Planning: NCM spoke to pt at bedside. Offered choice for [facility]. Pt states his wife had [facility] in the past. Requesting [facility]. Faxed orders to [facility] and left message for Liaison to follow up on [facility]. Pt states he has a motorized wheelchair at home. States his aide comes in a few hours each day to assist with ADL's. States he needs more hours. Explained to pt that will arrange [facility] to assist in getting more hours for aide. Explained the programs and that hours have a maximum depending on program. Pt state she does not know name of agency that provides his personal care assistant. Explained his PCP can also assist with him getting additional hours by completing necessary paperwork for Medicaid. [facility] arranged for transport back to his home once dc.   Reviewed pt's previous NCM notes and pt is HRI (High Risk Initiative) with [facility] to make aware of admit to ED. He is active with HHRN, PT,OT and aide. Made Liaison aware that SW was added. Will cancel referral with [facility].   PCP Lin Landsman MD  Expected Discharge Date:  11/09/2016               Expected Discharge Plan:  [name]  In-House Referral:  Clinical Social Work  Discharge planning Services  CM Consult  Post Acute Care Choice:  Home Health Choice offered to:  Patient  DME Arranged:  N/A DME Agency:  NA  HH Arranged:  PT, Nurse's Aide, Social Work, OT, Therapist, sports DISH Agency:  Nurse, learning disability Status of Service:  Completed, signed off  If discussed at [employer], dates discussed:    Additional Comments:  Erenest Rasher, RN 11/09/2016, 6:32 PM

## 2016-11-09 NOTE — ED Notes (Signed)
Delay on urine sample nurse is in the room starting an IV

## 2016-11-09 NOTE — Progress Notes (Signed)
CSW spoke with patient at bedside. Patient oriented x 4. CSW inquired about patient's request for placement. Patient reported that he has a nursing aide that comes 2x/week and that he has concerns about the aide quitting. Patient reported that he wanted additional help within the home to help assist with ADLs. Patient reported that he has no family locally and no significant relationships. Patient reported that he uses a wheel chair and noticed that he has been getting weaker when transferring from wheel chair to other places. Patient reported that he prefers for someone to come into the home but if he has to go to a facility he will go. CSW debriefed with patient's RN. Patient needs to consult with care management to obtain in home health. CSW contacted care management, no answer. CSW left message asking care management to follow up with patient.

## 2016-11-09 NOTE — ED Triage Notes (Signed)
Per EMS, patient is complaining of pain from his bed sores. Patient is wheelchair bound due to spina bifida. Patient was seen for the same on 12/28. Per EMS, patient wanted to come here because he wants to be placed into a nursing facility.

## 2016-11-09 NOTE — ED Provider Notes (Signed)
[facility] DEPT Provider Note   CSN: [id number] Arrival date & time: 11/09/16  1345     History   Chief Complaint No chief complaint on file.   HPI Patrick Orozco is a 61 y.o. male.  Patient with history of paraplegia, spina bifida, colostomy, decubitus ulcers, neurogenic bladder and UTI, depression, recent hospitalization for cellulitis and went home with home health -- presents with complaint of pain and weakness. Patient reports pain in the area of his left posterior thigh and left buttock decubitus ulcer. He has chronic swelling of his left leg which is unchanged. No fever, URI symptoms. He has an occasional cough. No chest pain, vomiting, diarrhea. He states that his appetite has recently been decreased. Patient was offered skilled nursing facility placement at last hospitalization, however wanted to go home with home health. Patient states that he has been having difficulty living by himself even with the assistance. Patient states that he has been able to usually transfer from his wheelchair however has been too weak to do this since returning home. The onset of this condition was acute. The course is constant. Aggravating factors: none. Alleviating factors: none.        Past Medical History:  Diagnosis Date  . Colostomy in place [facility])   . Depression    wife passed July 2013  . GERD (gastroesophageal reflux disease)   . Hyperlipidemia   . Hypertension   . Kidney stones   . Kidney stones   . Neurogenic bladder    uses condom cath  . Sarcoidosis ([place])   . Shortness of breath    occassionally  . Sleep apnea    pt denies sleep apnea pt states never followed through with sleep study; pt scored 5 per stop bang tool per PAT visit 07/17/2015; results sent to PCP Dr Ayesha Rumpf  . Spina bifida   . UTI (lower urinary tract infection)     Patient Active Problem List   Diagnosis Date Noted  . Generalized weakness 10/30/2016  . Left leg cellulitis 10/30/2016  . Prolonged Q-T  interval on ECG 10/30/2016  . Pressure ulcer 07/19/2015  . [place] kidney stones 07/18/2015  . Ureteral stone with hydronephrosis 06/15/2015  . OSA (obstructive sleep apnea) 07/13/2014  . UTI (urinary tract infection), bacterial 2/2 GNR's 01/07/2013  . H/O paraplegia 01/07/2013  . Recurrent kidney stones-non obstructive 01/07/2013  . Sarcoidosis ([place]) 01/07/2013  . Nausea and vomiting 01/05/2013  . Left flank pain 01/05/2013  . Thrombocytopenia ([place]) 01/05/2013  . Hypertension 01/05/2013  . History of spina bifida 01/05/2013    Past Surgical History:  Procedure Laterality Date  . COLOSTOMY    . CYSTOSCOPY W/ URETERAL STENT PLACEMENT Left 06/15/2015   Procedure: CYSTOSCOPY WITH STENT REPLACEMENT;  Surgeon: Raynelle Bring, MD;  Location: [facility];  Service: Urology;  Laterality: Left;  . CYSTOSCOPY/URETEROSCOPY/HOLMIUM LASER/STENT PLACEMENT Left 07/18/2015   Procedure: 1ST STAGE CYSTOSCOPY/URETEROSCOPY/HOLMIUM LASER/STENT REPLACEMENT;  Surgeon: Alexis Frock, MD;  Location: [facility];  Service: Urology;  Laterality: Left;  . CYSTOSCOPY/URETEROSCOPY/HOLMIUM LASER/STENT PLACEMENT Left 07/20/2015   Procedure: 2ND STAGE CYSTOSCOPY LEFT URETEROSCOPY STENT REPLACEMENT left retrograde;  Surgeon: Alexis Frock, MD;  Location: [facility];  Service: Urology;  Laterality: Left;  . ENDOBRONCHIAL ULTRASOUND Bilateral 04/25/2013   Procedure: ENDOBRONCHIAL ULTRASOUND;  Surgeon: Collene Gobble, MD;  Location: [facility];  Service: Cardiopulmonary;  Laterality: Bilateral;  . SPINE SURGERY     multiple surgeries related to spina bifida  . TOE AMPUTATION  2010   toe on left  foot       Home Medications    Prior to Admission medications   Medication Sig Start Date End Date Taking? Authorizing Provider  acetaminophen (TYLENOL) 325 MG tablet Take 2 tablets (650 mg total) by mouth every 6 (six) hours as needed for mild pain (or Fever >/= 101). 11/05/16   Kerney Elbe, DO  alum & mag hydroxide-simeth  (MAALOX/MYLANTA) 200-200-20 MG/5ML suspension Take 30 mLs by mouth every 6 (six) hours as needed for indigestion or heartburn. 11/05/16   Der[place], DO  clindamycin (CLEOCIN) 300 MG capsule Take 1 capsule (300 mg total) by mouth 3 (three) times daily. 11/05/16   Riley, DO  Fluticasone-Salmeterol (ADVAIR) 250-50 MCG/DOSE AEPB Inhale 1 puff into the lungs 2 (two) times daily as needed (shortness of breath).     Historical Provider, MD  hydrocerin (EUCERIN) CREA Apply 1 application topically 2 (two) times daily. 11/05/16   Holly Hill, DO  metoprolol (TOPROL-XL) 50 MG 24 hr tablet Take 50 mg by mouth daily.     Historical Provider, MD  nutrition supplement, JUVEN, (JUVEN) PACK Take 1 packet by mouth 3 (three) times daily. 11/05/16   [place], DO  simvastatin (ZOCOR) 20 MG tablet Take 20 mg by mouth at bedtime.     Historical Provider, MD    Family History Family History  Problem Relation Age of Onset  . Cancer Mother     ?lung  . Cancer Maternal Grandfather     lung    Social History Social History  Substance Use Topics  . Smoking status: Former Smoker    Packs/day: 0.25    Years: 22.00    Types: Cigarettes    Quit date: 11/03/1992  . Smokeless tobacco: Never Used  . Alcohol use No     Allergies   Ivp dye [iodinated diagnostic agents]   Review of Systems Review of Systems  Constitutional: Negative for fever.  HENT: Negative for rhinorrhea and sore throat.   Eyes: Negative for redness.  Respiratory: Negative for cough.   Cardiovascular: Positive for leg swelling (Chronic). Negative for chest pain.  Gastrointestinal: Negative for abdominal pain, diarrhea, nausea and vomiting.  Genitourinary: Negative for dysuria.  Musculoskeletal: Negative for myalgias.  Skin: Positive for wound (Decubitus ulcers). Negative for rash.  Neurological: Positive for weakness (Generalized). Negative for headaches.     Physical Exam Updated Vital Signs BP 118/65    Pulse 78   Temp 97.8 F (36.6 C) (Oral)   Resp 16   SpO2 98%   Physical Exam  Constitutional: He appears well-developed and well-nourished.  HENT:  Head: Normocephalic and atraumatic.  Mouth/Throat: Oropharynx is clear and moist.  Eyes: Conjunctivae are normal. Right eye exhibits no discharge. Left eye exhibits no discharge.  Neck: Normal range of motion. Neck supple.  Cardiovascular: Normal rate, regular rhythm and normal heart sounds.   No murmur heard. Pulmonary/Chest: Effort normal and breath sounds normal. No respiratory distress. He has no wheezes.  Abdominal: Soft. There is no tenderness.  Musculoskeletal:  Patient with left lower extremity swelling, 2+ pitting edema, generalized warmth.   Neurological: He is alert.  Skin: Skin is warm and dry.  Patient with irregular posterior left thigh and left inferior buttock decubitus ulcers. Wound base is pink without drainage or discharge.  Psychiatric: He has a normal mood and affect.  Nursing note and vitals reviewed.    ED Treatments / Results  Labs (all labs ordered are listed, but only abnormal  results are displayed) Labs Reviewed  CBC WITH DIFFERENTIAL/PLATELET - Abnormal; Notable for the following:       Result Value   RBC 3.74 (*)    Hemoglobin 10.3 (*)    HCT 33.2 (*)    RDW 15.7 (*)    All other components within normal limits  COMPREHENSIVE METABOLIC PANEL - Abnormal; Notable for the following:    CO2 21 (*)    Calcium 8.4 (*)    Albumin 2.7 (*)    ALT 9 (*)    All other components within normal limits  URINALYSIS, ROUTINE W REFLEX MICROSCOPIC - Abnormal; Notable for the following:    APPearance HAZY (*)    Hgb urine dipstick MODERATE (*)    Leukocytes, UA LARGE (*)    All other components within normal limits  URINALYSIS, MICROSCOPIC (REFLEX) - Abnormal; Notable for the following:    Bacteria, UA MANY (*)    Squamous Epithelial / LPF 6-30 (*)    All other components within normal limits  I-STAT CG4  LACTIC ACID, ED - Abnormal; Notable for the following:    Lactic Acid, Venous 2.34 (*)    All other components within normal limits  URINE CULTURE    EKG  EKG Interpretation  Date/Time:  Sunday November 09 2016 15:44:15 EST Ventricular Rate:  74 PR Interval:    QRS Duration: 89 QT Interval:  402 QTC Calculation: 446 R Axis:   58 Text Interpretation:  Sinus rhythm Probable left atrial enlargement Low voltage, precordial leads Confirmed by ZACKOWSKI  MD, SCOTT ([id number]) on 11/09/2016 3:48:29 PM       Radiology Dg Chest 2 View  Result Date: 11/09/2016 CLINICAL DATA:  61 year old with acute onset of lower extremity weakness earlier today says that he was unable to get out of his chair. Current history of sarcoidosis and hypertension. Former smoker. EXAM: CHEST  2 VIEW COMPARISON:  10/30/2016, 01/05/2013 and earlier, including CT chest 04/06/2013. FINDINGS: AP semi-erect and lateral images were obtained. Cardiac silhouette normal in size for technique. Thoracic aorta mildly tortuous and atherosclerotic, unchanged. Hilar and mediastinal contours otherwise unremarkable. Eventration of the left anterior hemidiaphragm. Lungs clear. Pulmonary vascularity normal. Bronchovascular markings normal. No pleural effusions. Kyphosis deformity at the thoracolumbar junction is felt to be positional and related to flexion of the spine. IMPRESSION: 1.  No acute cardiopulmonary disease. 2. Thoracic aortic atherosclerosis. Electronically Signed   By: Evangeline Dakin M.D.   On: 11/09/2016 15:46    Procedures Procedures (including critical care time)  Medications Ordered in ED Medications  sodium chloride 0.9 % bolus 1,000 mL (0 mLs Intravenous Stopped 11/09/16 1852)  cephALEXin (KEFLEX) capsule 500 mg (500 mg Oral Given 11/09/16 1852)     Initial Impression / Assessment and Plan / ED Course  I have reviewed the triage vital signs and the nursing notes.  Pertinent labs & imaging results that were available  during my care of the patient were reviewed by me and considered in my medical decision making (see chart for details).  Clinical Course    Patient seen and examined. Work-up initiated. Will need SW consult regarding concerns over being able to care for himself at home.   Vital signs reviewed and are as follows: BP 120/59   Pulse 76   Temp 97.8 F (36.6 C) (Oral)   Resp 22   SpO2 100%   After discussion with social worker, patient would like to maximize home health and return home today. Case manager then saw patient.  We have placed orders for face-to-face social worker, PT, nurse aide assistance. Patient is comfortable with this plan.   Labs at baseline. Chest x-ray does not show any pneumonia. EKG is unchanged. Urine shows too numerous count white blood cells. Will treat as UTI given reported weakness. Reviewed previous urine culture which grew out multiple species. Will treat with Keflex and send culture.  Patient updated on all results. Encouraged PCP follow-up this week for recheck. Return to the emergency department with worsening symptoms, new symptoms or other concerns.  Final Clinical Impressions(s) / ED Diagnoses   Final diagnoses:  Acute cystitis without hematuria  Weakness  Decubitus ulcer of left buttock, stage 1   Patient presents to the emergency department with weakness and concerns over being care for himself at home. He receives some home health already if the previous hospitalization. Additional consults from social work and Tourist information centre manager as above. Patient now wants to return home. Only acute medical issue seems to be worsening pyuria. Will treat given persistent weakness after recent hospitalization. Patient otherwise appears well, nontoxic. No indications for admission today. Vital signs are within normal limits.   New Prescriptions New Prescriptions   CEPHALEXIN (KEFLEX) 500 MG CAPSULE    Take 1 capsule (500 mg total) by mouth 4 (four) times daily.     Carlisle Cater, PA-C 11/09/16 1916    Fredia Sorrow, MD 11/11/16 [phone]

## 2016-11-09 NOTE — ED Notes (Signed)
Social work at bedside.  

## 2016-11-09 NOTE — Discharge Instructions (Signed)
Please read and follow all provided instructions.  Your diagnoses today include:  1. Acute cystitis without hematuria   2. Weakness   3. Decubitus ulcer of left buttock, stage 1     Tests performed today include:  Blood counts and electrolytes  Urine test - suggests urine infection  Vital signs. See below for your results today.   Medications prescribed:   Keflex (cephalexin) - antibiotic  You have been prescribed an antibiotic medicine: take the entire course of medicine even if you are feeling better. Stopping early can cause the antibiotic not to work.  Take any prescribed medications only as directed.  Home care instructions:  Follow any educational materials contained in this packet.  BE VERY CAREFUL not to take multiple medicines containing Tylenol (also called acetaminophen). Doing so can lead to an overdose which can damage your liver and cause liver failure and possibly death.   Follow-up instructions: Please follow-up with your primary care provider in the next 3 days for further evaluation of your symptoms.   Return instructions:   Please return to the Emergency Department if you experience worsening symptoms.   Please return if you have any other emergent concerns.  Additional Information:  Your vital signs today were: BP 120/59    Pulse 76    Temp 97.8 F (36.6 C) (Oral)    Resp 22    SpO2 100%  If your blood pressure (BP) was elevated above 135/85 this visit, please have this repeated by your doctor within one month. --------------

## 2016-11-12 LAB — URINE CULTURE: Culture: >=100000 — AB

## 2016-11-13 ENCOUNTER — Encounter (HOSPITAL_COMMUNITY): Payer: Self-pay

## 2016-11-13 ENCOUNTER — Emergency Department (HOSPITAL_COMMUNITY)
Admission: EM | Admit: 2016-11-13 | Discharge: 2016-11-14 | Disposition: A | Discharge status: Home / Self Care | Payer: Medicare HMO | Attending: Emergency Medicine | Admitting: Emergency Medicine

## 2016-11-13 ENCOUNTER — Telehealth (HOSPITAL_BASED_OUTPATIENT_CLINIC_OR_DEPARTMENT_OTHER): Payer: Self-pay

## 2016-11-13 DIAGNOSIS — Z87728 Personal history of other specified (corrected) congenital malformations of nervous system and sense organs: Secondary | ICD-10-CM

## 2016-11-13 DIAGNOSIS — Z87798 Personal history of other (corrected) congenital malformations: Secondary | ICD-10-CM | POA: Insufficient documentation

## 2016-11-13 DIAGNOSIS — I10 Essential (primary) hypertension: Secondary | ICD-10-CM | POA: Insufficient documentation

## 2016-11-13 DIAGNOSIS — N390 Urinary tract infection, site not specified: Secondary | ICD-10-CM | POA: Insufficient documentation

## 2016-11-13 DIAGNOSIS — Z79899 Other long term (current) drug therapy: Secondary | ICD-10-CM | POA: Diagnosis not present

## 2016-11-13 DIAGNOSIS — Z87891 Personal history of nicotine dependence: Secondary | ICD-10-CM | POA: Insufficient documentation

## 2016-11-13 DIAGNOSIS — R531 Weakness: Secondary | ICD-10-CM | POA: Diagnosis present

## 2016-11-13 LAB — CBC
HCT: 34.6 % — ABNORMAL LOW (ref 39.0–52.0)
Hemoglobin: 10.9 g/dL — ABNORMAL LOW (ref 13.0–17.0)
MCH: 27.4 pg (ref 26.0–34.0)
MCHC: 31.5 g/dL (ref 30.0–36.0)
MCV: 86.9 fL (ref 78.0–100.0)
PLATELETS: 273 10*3/uL (ref 150–400)
RBC: 3.98 MIL/uL — AB (ref 4.22–5.81)
RDW: 15.4 % (ref 11.5–15.5)
WBC: 4.3 10*3/uL (ref 4.0–10.5)

## 2016-11-13 LAB — URINALYSIS, ROUTINE W REFLEX MICROSCOPIC
Bilirubin Urine: NEGATIVE
Glucose, UA: NEGATIVE mg/dL
KETONES UR: NEGATIVE mg/dL
Nitrite: POSITIVE — AB
PROTEIN: NEGATIVE mg/dL
Specific Gravity, Urine: 1.013 (ref 1.005–1.030)
pH: 5 (ref 5.0–8.0)

## 2016-11-13 LAB — BASIC METABOLIC PANEL
Anion gap: 5 (ref 5–15)
BUN: 9 mg/dL (ref 6–20)
CHLORIDE: 110 mmol/L (ref 101–111)
CO2: 23 mmol/L (ref 22–32)
CREATININE: 0.84 mg/dL (ref 0.61–1.24)
Calcium: 8.4 mg/dL — ABNORMAL LOW (ref 8.9–10.3)
GFR calc non Af Amer: >60 mL/min (ref >60)
Glucose, Bld: 81 mg/dL (ref 65–99)
Potassium: 3.8 mmol/L (ref 3.5–5.1)
Sodium: 138 mmol/L (ref 135–145)

## 2016-11-13 NOTE — ED Triage Notes (Addendum)
Per EMS, Pt, from home, c/o weakness, BLE swelling/pain, and lack of appetite x 1 week.  Pain score 7/10.  Pt was just seen at [facility] x 4 days ago for same.  Pt is wheelchair bound.  Hx of spina bifida.  Pt was seen by Home Health this afternoon and they were concerned w/ his BP.      Pt has concerns about his Home Health resources and sts "I'm almost to the point that I can't live by myself."

## 2016-11-13 NOTE — ED Notes (Signed)
Pt has been told a urine sample is needed, states that he can not provide [place] with one because he has not had anything to eat or drink all day.

## 2016-11-13 NOTE — Telephone Encounter (Signed)
Post ED Visit - Positive Culture Follow-up: Successful Patient Follow-Up  Culture assessed and recommendations reviewed by: '[]'$  Elenor Quinones, Pharm.D. '[]'$  Heide Guile, Pharm.D., BCPS '[]'$  Parks Neptune, Pharm.D. '[]'$  Alycia Rossetti, Pharm.D., BCPS '[]'$  [place], Pharm.D., BCPS, AAHIVP '[]'$  Legrand Como, Pharm.D., BCPS, AAHIVP '[]'$  Milus Glazier, Pharm.D. '[]'$  Stephens November, [place].DStanford Breed, Pharm.D.  Positive urine culture, >/= 100,000 colonies -> citrobacter species  '[]'$  Patient discharged without antimicrobial prescription and treatment is now indicated '[x]'$  Organism is resistant to prescribed ED discharge antimicrobial (Cephalexin) '[]'$  Patient with positive blood cultures  Changes discussed with ED provider: Carlisle Cater PA-C New antibiotic prescription Stop Cephalexin, Start Ciprofloxacin 500 mg po BID x 7 days" Called to   Contacted patient, date 11/13/16, time 1326 Spoke w/pt.  Pt stated getting ready to come back to ED feeling worse and not able to eat.  Rx not called in.   Dortha Kern 11/13/2016, 1:28 PM

## 2016-11-13 NOTE — Progress Notes (Signed)
ED Antimicrobial Stewardship Positive Culture Follow Up   Patrick Orozco is an 61 y.o. male who presented to [facility] on 11/09/2016 with a chief complaint of No chief complaint on file.   Recent Results (from the past 720 hour(s))  Blood Culture (routine x 2)     Status: None   Collection Time: 10/30/16  8:17 AM  Result Value Ref Range Status   Specimen Description BLOOD LEFT HAND  Final   Special Requests BOTTLES DRAWN AEROBIC AND ANAEROBIC 5ML  Final   Culture   Final    NO GROWTH 5 DAYS Performed at [facility]    Report Status 11/04/2016 FINAL  Final  Blood Culture (routine x 2)     Status: None   Collection Time: 10/30/16  9:15 AM  Result Value Ref Range Status   Specimen Description BLOOD RIGHT HAND  Final   Special Requests IN PEDIATRIC BOTTLE [place]  Final   Culture   Final    NO GROWTH 5 DAYS Performed at [facility]    Report Status 11/04/2016 FINAL  Final  Urine culture     Status: Abnormal   Collection Time: 10/30/16  9:17 AM  Result Value Ref Range Status   Specimen Description URINE, CLEAN CATCH  Final   Special Requests NONE  Final   Culture MULTIPLE SPECIES PRESENT, SUGGEST RECOLLECTION (A)  Final   Report Status 10/31/2016 FINAL  Final  MRSA PCR Screening     Status: None   Collection Time: 11/01/16  6:39 AM  Result Value Ref Range Status   MRSA by PCR NEGATIVE NEGATIVE Final    Comment:        The GeneXpert MRSA Assay (FDA approved for NASAL specimens only), is one component of a comprehensive MRSA colonization surveillance program. It is not intended to diagnose MRSA infection nor to guide or monitor treatment for MRSA infections.   Urine culture     Status: Abnormal   Collection Time: 11/09/16  4:50 PM  Result Value Ref Range Status   Specimen Description URINE, RANDOM  Final   Special Requests NONE  Final   Culture >=100,000 COLONIES/mL CITROBACTER SPECIES (A)  Final   Report Status 11/12/2016 FINAL  Final   Organism ID,  Bacteria CITROBACTER SPECIES (A)  Final      Susceptibility   Citrobacter species - MIC*    CEFAZOLIN >=64 RESISTANT Resistant     CEFTRIAXONE <=1 SENSITIVE Sensitive     CIPROFLOXACIN <=0.25 SENSITIVE Sensitive     GENTAMICIN <=1 SENSITIVE Sensitive     IMIPENEM <=0.25 SENSITIVE Sensitive     NITROFURANTOIN 32 SENSITIVE Sensitive     TRIMETH/SULFA <=20 SENSITIVE Sensitive     PIP/TAZO <=4 SENSITIVE Sensitive     * >=100,000 COLONIES/mL CITROBACTER SPECIES    '[x]'$  Treated with cephalexin, organism resistant to prescribed antimicrobial  New antibiotic prescription: STOP cephalexin START ciprofloxacin '500mg'$  PO BID x 7 days.   ED Provider: Alecia Lemming, PA  Delane Ginger 11/13/2016, 9:21 AM Infectious Diseases Pharmacist Phone# [phone]

## 2016-11-13 NOTE — ED Provider Notes (Signed)
[facility] DEPT Provider Note   CSN: [id number] Arrival date & time: 11/13/16  1423  By signing my name below, I, Arianna Nassar, attest that this documentation has been prepared under the direction and in the presence of Veryl Speak, MD.  Electronically Signed: Julien Nordmann, ED Scribe. 11/13/16. 12:05 AM.    History   Chief Complaint Chief Complaint  Patient presents with  . Weakness    The history is provided by the patient and the EMS personnel. No language interpreter was used.  Weakness  Primary symptoms include focal weakness. This is a recurrent problem. The current episode started more than 1 week ago. The problem has been gradually worsening. There was right lower extremity and left lower extremity focality noted. There has been no fever. Pertinent negatives include no vomiting. There were no medications administered prior to arrival.   HPI Comments: Patrick Orozco is a 61 y.o. male brought in by ambulance, who has a PMhx of GERD, HLD, HTN, sarcoidosis, spina bifida and recurrent UTI's presents to the Emergency Department complaining of moderate, gradual worsening, generalized weakness x 1 week. He expresses loss of appetite x 1 week and increased bilateral lower extremity leg swelling. He has been consuming water and apple sauce this past week but feels as if he has not had any appetite. Pt has a colostomy in place and is still able to urinate. He states having difficulty transferring from the sofa or bed back to his wheelchair which is abnormal. Pt was seen on 11/09/16 for the similar presentation of symptoms and was treated for a UTI with Keflex. He has not been taking the Keflex that was prescribed to him. He was also seen in the ED on 10/30/16 for generalized weakness as well and which led to his admission. Pt lives at home by himself and reports he used to have a home health nurse but does not anymore. He expresses that he does not feel as if he is able to get by on his  own anymore due to increased difficulty from weakness. He denies any vomiting and change in stool or urine.  Past Medical History:  Diagnosis Date  . Colostomy in place [facility])   . Depression    wife passed July 2013  . GERD (gastroesophageal reflux disease)   . Hyperlipidemia   . Hypertension   . Kidney stones   . Kidney stones   . Neurogenic bladder    uses condom cath  . Sarcoidosis ([place])   . Shortness of breath    occassionally  . Sleep apnea    pt denies sleep apnea pt states never followed through with sleep study; pt scored 5 per stop bang tool per PAT visit 07/17/2015; results sent to PCP Dr Ayesha Rumpf  . Spina bifida   . UTI (lower urinary tract infection)     Patient Active Problem List   Diagnosis Date Noted  . Generalized weakness 10/30/2016  . Left leg cellulitis 10/30/2016  . Prolonged Q-T interval on ECG 10/30/2016  . Pressure ulcer 07/19/2015  . Staghorn kidney stones 07/18/2015  . Ureteral stone with hydronephrosis 06/15/2015  . OSA (obstructive sleep apnea) 07/13/2014  . UTI (urinary tract infection), bacterial 2/2 GNR's 01/07/2013  . H/O paraplegia 01/07/2013  . Recurrent kidney stones-non obstructive 01/07/2013  . Sarcoidosis ([place]) 01/07/2013  . Nausea and vomiting 01/05/2013  . Left flank pain 01/05/2013  . Thrombocytopenia ([place]) 01/05/2013  . Hypertension 01/05/2013  . History of spina bifida 01/05/2013    Past Surgical  History:  Procedure Laterality Date  . COLOSTOMY    . CYSTOSCOPY W/ URETERAL STENT PLACEMENT Left 06/15/2015   Procedure: CYSTOSCOPY WITH STENT REPLACEMENT;  Surgeon: Raynelle Bring, MD;  Location: [facility];  Service: Urology;  Laterality: Left;  . CYSTOSCOPY/URETEROSCOPY/HOLMIUM LASER/STENT PLACEMENT Left 07/18/2015   Procedure: 1ST STAGE CYSTOSCOPY/URETEROSCOPY/HOLMIUM LASER/STENT REPLACEMENT;  Surgeon: Alexis Frock, MD;  Location: [facility];  Service: Urology;  Laterality: Left;  . CYSTOSCOPY/URETEROSCOPY/HOLMIUM LASER/STENT PLACEMENT Left  07/20/2015   Procedure: 2ND STAGE CYSTOSCOPY LEFT URETEROSCOPY STENT REPLACEMENT left retrograde;  Surgeon: Alexis Frock, MD;  Location: [facility];  Service: Urology;  Laterality: Left;  . ENDOBRONCHIAL ULTRASOUND Bilateral 04/25/2013   Procedure: ENDOBRONCHIAL ULTRASOUND;  Surgeon: Collene Gobble, MD;  Location: [facility];  Service: Cardiopulmonary;  Laterality: Bilateral;  . SPINE SURGERY     multiple surgeries related to spina bifida  . TOE AMPUTATION  2010   toe on left foot       Home Medications    Prior to Admission medications   Medication Sig Start Date End Date Taking? Authorizing Provider  Fluticasone-Salmeterol (ADVAIR) 250-50 MCG/DOSE AEPB Inhale 1 puff into the lungs 2 (two) times daily as needed (shortness of breath).    Yes Historical Provider, MD  metoprolol (TOPROL-XL) 50 MG 24 hr tablet Take 50 mg by mouth daily.    Yes Historical Provider, MD  simvastatin (ZOCOR) 20 MG tablet Take 20 mg by mouth at bedtime.    Yes Historical Provider, MD  acetaminophen (TYLENOL) 325 MG tablet Take 2 tablets (650 mg total) by mouth every 6 (six) hours as needed for mild pain (or Fever >/= 101). Patient not taking: Reported on 11/13/2016 11/05/16   Kerney Elbe, DO  alum & mag hydroxide-simeth (MAALOX/MYLANTA) 200-200-20 MG/5ML suspension Take 30 mLs by mouth every 6 (six) hours as needed for indigestion or heartburn. Patient not taking: Reported on 11/13/2016 11/05/16   Kerney Elbe, DO  cephALEXin (KEFLEX) 500 MG capsule Take 1 capsule (500 mg total) by mouth 4 (four) times daily. Patient not taking: Reported on 11/13/2016 11/09/16   Carlisle Cater, PA-C  clindamycin (CLEOCIN) 300 MG capsule Take 1 capsule (300 mg total) by mouth 3 (three) times daily. Patient not taking: Reported on 11/13/2016 11/05/16   Kerney Elbe, DO  hydrocerin (EUCERIN) CREA Apply 1 application topically 2 (two) times daily. Patient not taking: Reported on 11/13/2016 11/05/16   Kerney Elbe, DO    nutrition supplement, JUVEN, (JUVEN) PACK Take 1 packet by mouth 3 (three) times daily. Patient not taking: Reported on 11/13/2016 11/05/16   Kerney Elbe, DO    Family History Family History  Problem Relation Age of Onset  . Cancer Mother     ?lung  . Cancer Maternal Grandfather     lung    Social History Social History  Substance Use Topics  . Smoking status: Former Smoker    Packs/day: 0.25    Years: 22.00    Types: Cigarettes    Quit date: 11/03/1992  . Smokeless tobacco: Never Used  . Alcohol use No     Allergies   Ivp dye [iodinated diagnostic agents]   Review of Systems Review of Systems  Constitutional: Positive for appetite change.  Cardiovascular: Positive for leg swelling.  Gastrointestinal: Negative for vomiting.  Neurological: Positive for focal weakness and weakness.  All other systems reviewed and are negative.    Physical Exam Updated Vital Signs BP 149/90 (BP Location: Left Wrist)   Pulse 98  Temp 97.7 F (36.5 C) (Oral)   Resp 18   Ht '5\' 5"'$  (1.651 m)   Wt 200 lb (90.7 kg)   SpO2 98%   BMI 33.28 kg/m   Physical Exam  Constitutional: He is oriented to person, place, and time. He appears well-developed and well-nourished.  HENT:  Head: Normocephalic and atraumatic.  Eyes: EOM are normal.  Neck: Normal range of motion.  Cardiovascular: Normal rate, regular rhythm, normal heart sounds and intact distal pulses.   Pulmonary/Chest: Effort normal and breath sounds normal. No respiratory distress.  Abdominal: Soft. He exhibits no distension. There is no tenderness.  Musculoskeletal:  Pt with flaccid lower extremities.  Neurological: He is alert and oriented to person, place, and time.  Skin: Skin is warm and dry.  Psychiatric: He has a normal mood and affect. Judgment normal.  Nursing note and vitals reviewed.    ED Treatments / Results  DIAGNOSTIC STUDIES: Oxygen Saturation is 98% on RA, normal by my interpretation.  COORDINATION  OF CARE:  11:56 PM Discussed treatment plan with pt at bedside and pt agreed to plan.  Labs (all labs ordered are listed, but only abnormal results are displayed) Labs Reviewed  BASIC METABOLIC PANEL - Abnormal; Notable for the following:       Result Value   Calcium 8.4 (*)    All other components within normal limits  CBC - Abnormal; Notable for the following:    RBC 3.98 (*)    Hemoglobin 10.9 (*)    HCT 34.6 (*)    All other components within normal limits  URINALYSIS, ROUTINE W REFLEX MICROSCOPIC - Abnormal; Notable for the following:    APPearance CLOUDY (*)    Hgb urine dipstick SMALL (*)    Nitrite POSITIVE (*)    Leukocytes, UA LARGE (*)    Bacteria, UA MANY (*)    Squamous Epithelial / LPF 6-30 (*)    All other components within normal limits    EKG  EKG Interpretation None       Radiology No results found.  Procedures Procedures (including critical care time)  Medications Ordered in ED Medications - No data to display   Initial Impression / Assessment and Plan / ED Course  I have reviewed the triage vital signs and the nursing notes.  Pertinent labs & imaging results that were available during my care of the patient were reviewed by me and considered in my medical decision making (see chart for details).  Clinical Course     Patient with history of spina bifida brought by EMS for evaluation of weakness and decreased appetite. This is been ongoing for quite some time. He was seen 4 days ago with similar complaints, then discharged. His workup today reveals no elevation of white count, essentially unchanged laboratory studies, and urinalysis that is suggestive of a UTI. He does have an indwelling catheter. He was given Keflex and Bactrim here in the ER and will be discharged with prescriptions for both of these.   He reports to me that he has been weaker and having difficulty caring for himself at home. He has had case management and social services  involved with his prior visits. From reading the chart, it appears as though additional home health services have been investigated, however have not occurred yet. I feel as though the patient is appropriate for discharge. I will involve case management and social services in the morning to have them look into the status of the home health care.  Final Clinical Impressions(s) / ED Diagnoses   Final diagnoses:  None   I personally performed the services described in this documentation, which was scribed in my presence. The recorded information has been reviewed and is accurate.     New Prescriptions New Prescriptions   No medications on file     Veryl Speak, MD 11/14/16 0140

## 2016-11-14 DIAGNOSIS — R531 Weakness: Secondary | ICD-10-CM | POA: Diagnosis not present

## 2016-11-14 MED ORDER — CEPHALEXIN 500 MG PO CAPS: 500.0000 mg | ORAL_CAPSULE | Freq: Four times a day (QID) | ORAL | 0 refills | Status: DC

## 2016-11-14 MED ORDER — SULFAMETHOXAZOLE-TRIMETHOPRIM 800-160 MG PO TABS
1.0000 | ORAL_TABLET | Freq: Once | ORAL | Status: AC
  Administered 2016-11-14: 1 via ORAL
  Filled 2016-11-14: qty 1

## 2016-11-14 MED ORDER — CEPHALEXIN 500 MG PO CAPS
500.0000 mg | ORAL_CAPSULE | Freq: Once | ORAL | Status: AC
  Administered 2016-11-14: 500 mg via ORAL
  Filled 2016-11-14: qty 1

## 2016-11-14 NOTE — ED Notes (Signed)
[facility] picking up patient to take him home.

## 2016-11-14 NOTE — Discharge Instructions (Signed)
Keflex as prescribed.  Continue to investigate home health services as recommended at your prior hospitalization.

## 2016-12-12 ENCOUNTER — Encounter (HOSPITAL_BASED_OUTPATIENT_CLINIC_OR_DEPARTMENT_OTHER): Payer: Medicare HMO

## 2016-12-16 ENCOUNTER — Encounter (HOSPITAL_COMMUNITY): Payer: Self-pay | Admitting: *Deleted

## 2016-12-16 ENCOUNTER — Emergency Department (HOSPITAL_COMMUNITY)
Admission: EM | Admit: 2016-12-16 | Discharge: 2016-12-16 | Disposition: A | Discharge status: Home / Self Care | Payer: Medicare HMO | Attending: Emergency Medicine | Admitting: Emergency Medicine

## 2016-12-16 ENCOUNTER — Emergency Department (HOSPITAL_COMMUNITY): Payer: Medicare HMO

## 2016-12-16 DIAGNOSIS — R531 Weakness: Secondary | ICD-10-CM | POA: Insufficient documentation

## 2016-12-16 DIAGNOSIS — Z87891 Personal history of nicotine dependence: Secondary | ICD-10-CM | POA: Insufficient documentation

## 2016-12-16 DIAGNOSIS — Z79899 Other long term (current) drug therapy: Secondary | ICD-10-CM | POA: Insufficient documentation

## 2016-12-16 DIAGNOSIS — I10 Essential (primary) hypertension: Secondary | ICD-10-CM | POA: Diagnosis not present

## 2016-12-16 LAB — CBC WITH DIFFERENTIAL/PLATELET
Basophils Absolute: 0 10*3/uL (ref 0.0–0.1)
Basophils Relative: 0 %
Eosinophils Absolute: 0.3 10*3/uL (ref 0.0–0.7)
Eosinophils Relative: 4 %
HEMATOCRIT: 33.2 % — AB (ref 39.0–52.0)
HEMOGLOBIN: 10.5 g/dL — AB (ref 13.0–17.0)
LYMPHS ABS: 1.1 10*3/uL (ref 0.7–4.0)
LYMPHS PCT: 18 %
MCH: 27.7 pg (ref 26.0–34.0)
MCHC: 31.6 g/dL (ref 30.0–36.0)
MCV: 87.6 fL (ref 78.0–100.0)
MONO ABS: 0.6 10*3/uL (ref 0.1–1.0)
MONOS PCT: 9 %
NEUTROS ABS: 4.2 10*3/uL (ref 1.7–7.7)
Neutrophils Relative %: 69 %
Platelets: 298 10*3/uL (ref 150–400)
RBC: 3.79 MIL/uL — ABNORMAL LOW (ref 4.22–5.81)
RDW: 16 % — AB (ref 11.5–15.5)
WBC: 6.2 10*3/uL (ref 4.0–10.5)

## 2016-12-16 LAB — COMPREHENSIVE METABOLIC PANEL
ALK PHOS: 59 U/L (ref 38–126)
ALT: 16 U/L — ABNORMAL LOW (ref 17–63)
ANION GAP: 7 (ref 5–15)
AST: 22 U/L (ref 15–41)
Albumin: 2.6 g/dL — ABNORMAL LOW (ref 3.5–5.0)
BILIRUBIN TOTAL: 1.1 mg/dL (ref 0.3–1.2)
BUN: 18 mg/dL (ref 6–20)
CALCIUM: 8.9 mg/dL (ref 8.9–10.3)
CO2: 22 mmol/L (ref 22–32)
Chloride: 108 mmol/L (ref 101–111)
Creatinine, Ser: 1.01 mg/dL (ref 0.61–1.24)
GFR calc Af Amer: >60 mL/min (ref >60)
Glucose, Bld: 74 mg/dL (ref 65–99)
POTASSIUM: 3.6 mmol/L (ref 3.5–5.1)
Sodium: 137 mmol/L (ref 135–145)
TOTAL PROTEIN: 7.1 g/dL (ref 6.5–8.1)

## 2016-12-16 LAB — CBG MONITORING, ED: Glucose-Capillary: 68 mg/dL (ref 65–99)

## 2016-12-16 LAB — URINALYSIS, ROUTINE W REFLEX MICROSCOPIC
Bilirubin Urine: NEGATIVE
GLUCOSE, UA: NEGATIVE mg/dL
HGB URINE DIPSTICK: NEGATIVE
KETONES UR: 20 mg/dL — AB
LEUKOCYTES UA: NEGATIVE
Nitrite: NEGATIVE
PROTEIN: NEGATIVE mg/dL
Specific Gravity, Urine: 1.019 (ref 1.005–1.030)
pH: 5 (ref 5.0–8.0)

## 2016-12-16 LAB — CK: Total CK: 200 U/L (ref 49–397)

## 2016-12-16 LAB — TSH: TSH: 1.388 u[IU]/mL (ref 0.350–4.500)

## 2016-12-16 LAB — I-STAT TROPONIN, ED: Troponin i, poc: 0.01 ng/mL (ref 0.00–0.08)

## 2016-12-16 NOTE — ED Notes (Signed)
[facility] called for transport.  

## 2016-12-16 NOTE — ED Provider Notes (Addendum)
[facility] DEPT Provider Note   CSN: [id number] Arrival date & time: 12/16/16  1437     History   Chief Complaint No chief complaint on file.   HPI Rowland T Heinemann is a 61 y.o. male.  The history is provided by the patient.  Weakness  Primary symptoms comment: generalized weakness. This is a new problem. Episode onset: 2-3 days ago while sitting in living room felt weak when beginning to move. The problem has been gradually worsening. There was no focality noted. There has been no fever. Pertinent negatives include no shortness of breath, no chest pain and no vomiting.    Past Medical History:  Diagnosis Date  . Colostomy in place [facility])   . Depression    wife passed July 2013  . GERD (gastroesophageal reflux disease)   . Hyperlipidemia   . Hypertension   . Kidney stones   . Kidney stones   . Neurogenic bladder    uses condom cath  . Sarcoidosis ([place])   . Shortness of breath    occassionally  . Sleep apnea    pt denies sleep apnea pt states never followed through with sleep study; pt scored 5 per stop bang tool per PAT visit 07/17/2015; results sent to PCP Dr Ayesha Rumpf  . Spina bifida   . UTI (lower urinary tract infection)     Patient Active Problem List   Diagnosis Date Noted  . Generalized weakness 10/30/2016  . Left leg cellulitis 10/30/2016  . Prolonged Q-T interval on ECG 10/30/2016  . Pressure ulcer 07/19/2015  . Staghorn kidney stones 07/18/2015  . Ureteral stone with hydronephrosis 06/15/2015  . OSA (obstructive sleep apnea) 07/13/2014  . UTI (urinary tract infection), bacterial 2/2 GNR's 01/07/2013  . H/O paraplegia 01/07/2013  . Recurrent kidney stones-non obstructive 01/07/2013  . Sarcoidosis ([place]) 01/07/2013  . Nausea and vomiting 01/05/2013  . Left flank pain 01/05/2013  . Thrombocytopenia ([place]) 01/05/2013  . Hypertension 01/05/2013  . History of spina bifida 01/05/2013    Past Surgical History:  Procedure Laterality Date  . COLOSTOMY    .  CYSTOSCOPY W/ URETERAL STENT PLACEMENT Left 06/15/2015   Procedure: CYSTOSCOPY WITH STENT REPLACEMENT;  Surgeon: Raynelle Bring, MD;  [facility];  Service: Urology;  Laterality: Left;  . CYSTOSCOPY/URETEROSCOPY/HOLMIUM LASER/STENT PLACEMENT Left 07/18/2015   Procedure: 1ST STAGE CYSTOSCOPY/URETEROSCOPY/HOLMIUM LASER/STENT REPLACEMENT;  Surgeon: Alexis Frock, MD;  Location[facility];  Service: Urology;  Laterality: Left;  . CYSTOSCOPY/URETEROSCOPY/HOLMIUM LASER/STENT PLACEMENT Left 07/20/2015   Procedure: 2ND STAGE CYSTOSCOPY LEFT URETEROSCOPY STENT REPLACEMENT left retrograde;  Surgeon: Alexis Frock, MD;  Location: [facility];  Service: Urology;  Laterality: Left;  . ENDOBRONCHIAL ULTRASOUND Bilateral 04/25/2013   Procedure: ENDOBRONCHIAL ULTRASOUND;  Surgeon: Collene Gobble, MD;  Location: [facility];  Service: Cardiopulmonary;  Laterality: Bilateral;  . SPINE SURGERY     multiple surgeries related to spina bifida  . TOE AMPUTATION  2010   toe on left foot       Home Medications    Prior to Admission medications   Medication Sig Start Date End Date Taking? Authorizing Provider  acetaminophen (TYLENOL) 325 MG tablet Take 2 tablets (650 mg total) by mouth every 6 (six) hours as needed for mild pain (or Fever >/= 101). Patient not taking: Reported on 11/13/2016 11/05/16   Kerney Elbe, DO  alum & mag hydroxide-simeth (MAALOX/MYLANTA) 200-200-20 MG/5ML suspension Take 30 mLs by mouth every 6 (six) hours as needed for indigestion or heartburn. Patient not taking: Reported on  11/13/2016 11/05/16   [place], DO  cephALEXin (KEFLEX) 500 MG capsule Take 1 capsule (500 mg total) by mouth 4 (four) times daily. 11/14/16   Veryl Speak, MD  clindamycin (CLEOCIN) 300 MG capsule Take 1 capsule (300 mg total) by mouth 3 (three) times daily. Patient not taking: Reported on 11/13/2016 11/05/16   Kerney Elbe, DO  Fluticasone-Salmeterol (ADVAIR) 250-50 MCG/DOSE AEPB Inhale 1 puff into the  lungs 2 (two) times daily as needed (shortness of breath).     Historical Provider, MD  hydrocerin (EUCERIN) CREA Apply 1 application topically 2 (two) times daily. Patient not taking: Reported on 11/13/2016 11/05/16   Kerney Elbe, DO  metoprolol (TOPROL-XL) 50 MG 24 hr tablet Take 50 mg by mouth daily.     Historical Provider, MD  nutrition supplement, JUVEN, (JUVEN) PACK Take 1 packet by mouth 3 (three) times daily. Patient not taking: Reported on 11/13/2016 11/05/16   Kerney Elbe, DO  simvastatin (ZOCOR) 20 MG tablet Take 20 mg by mouth at bedtime.     Historical Provider, MD    Family History Family History  Problem Relation Age of Onset  . Cancer Mother     ?lung  . Cancer Maternal Grandfather     lung    Social History Social History  Substance Use Topics  . Smoking status: Former Smoker    Packs/day: 0.25    Years: 22.00    Types: Cigarettes    Quit date: 11/03/1992  . Smokeless tobacco: Never Used  . Alcohol use No     Allergies   Ivp dye [iodinated diagnostic agents]   Review of Systems Review of Systems  Respiratory: Negative for shortness of breath.   Cardiovascular: Negative for chest pain.  Gastrointestinal: Negative for vomiting.  Neurological: Positive for weakness.  All other systems reviewed and are negative.    Physical Exam Updated Vital Signs BP 146/85   Pulse 89   Temp 97.5 F (36.4 C) (Oral)   Resp 20   SpO2 100%   Physical Exam  Constitutional: He is oriented to person, place, and time. He appears well-developed and well-nourished. No distress.  HENT:  Head: Normocephalic and atraumatic.  Nose: Nose normal.  Eyes: Conjunctivae are normal.  Neck: Neck supple. No tracheal deviation present.  Cardiovascular: Normal rate, regular rhythm and normal heart sounds.   Pulmonary/Chest: Effort normal and breath sounds normal. No respiratory distress.  Abdominal: Soft. He exhibits no distension. There is no tenderness. There is no  rebound and no guarding.  Neurological: He is alert and oriented to person, place, and time. No cranial nerve deficit.  Skin: Skin is warm and dry. Capillary refill takes less than 2 seconds.  Psychiatric: He has a normal mood and affect. His behavior is normal.     ED Treatments / Results  Labs (all labs ordered are listed, but only abnormal results are displayed) Labs Reviewed  CBC WITH DIFFERENTIAL/PLATELET - Abnormal; Notable for the following:       Result Value   RBC 3.79 (*)    Hemoglobin 10.5 (*)    HCT 33.2 (*)    RDW 16.0 (*)    All other components within normal limits  COMPREHENSIVE METABOLIC PANEL - Abnormal; Notable for the following:    Albumin 2.6 (*)    ALT 16 (*)    All other components within normal limits  URINALYSIS, ROUTINE W REFLEX MICROSCOPIC - Abnormal; Notable for the following:    Ketones, ur 20 (*)  All other components within normal limits  URINE CULTURE  TSH  CK  INFLUENZA PANEL BY PCR (TYPE A & B)  CBG MONITORING, ED  I-STAT TROPOININ, ED    EKG  EKG Interpretation  Date/Time:  Tuesday December 16 2016 15:23:23 EST Ventricular Rate:  69 PR Interval:    QRS Duration: 96 QT Interval:  415 QTC Calculation: 445 R Axis:   39 Text Interpretation:  Sinus rhythm Abnormal R-wave progression, early transition Otherwise normal ECG No significant change since last tracing Confirmed by Jahmal Dunavant MD, Jaquavious Mercer [phone]) on 12/16/2016 3:57:00 PM       Radiology Dg Chest 2 View  Result Date: 12/16/2016 CLINICAL DATA:  Shortness of breath. EXAM: CHEST  2 VIEW COMPARISON:  Radiographs of November 09, 2016. FINDINGS: The heart size and mediastinal contours are within normal limits. Both lungs are clear. No pneumothorax or pleural effusion is noted. The visualized skeletal structures are unremarkable. IMPRESSION: No active cardiopulmonary disease. Electronically Signed   By: Marijo Conception, M.D.   On: 12/16/2016 16:16    Procedures Procedures (including  critical care time)  Medications Ordered in ED Medications - No data to display   Initial Impression / Assessment and Plan / ED Course  I have reviewed the triage vital signs and the nursing notes.  Pertinent labs & imaging results that were available during my care of the patient were reviewed by me and considered in my medical decision making (see chart for details).     61 y.o. male presents with weakness. Has had this on multiple previous visits and received workups that have shown UTI but have otherwise been unremarkable. Lost to f/u with home health. Repeat workup today shows no UTI, no significant signs of infection. No significant hematologic or metabolic abnormalities to explain symptoms. Thyroid function normal. Pt appears to be deconditioned and would likely benefit from engaging PT at home again. Discussed lack of indication for inpatient admission and Pt is understanding. He has contact info for home health services that he has had previously. Plan to follow up with PCP as needed and return precautions discussed for worsening or new concerning symptoms.   Final Clinical Impressions(s) / ED Diagnoses   Final diagnoses:  Generalized weakness    New Prescriptions New Prescriptions   No medications on file     Leo Grosser, MD 12/17/16 [id number]    Leo Grosser, MD 12/17/16 [phone]

## 2016-12-16 NOTE — ED Notes (Addendum)
EMS advised that keys were not in the truck that he was transported in today.  Stated that they would speak with the driver at [phone] tomorrow morning.  Patient is attempting to get a hold of  Maintenance at his apartment to assist him getting into his house.

## 2016-12-16 NOTE — ED Notes (Signed)
Unable to obtain lab work and IV. Another RN to attempt ultrasound IV.

## 2016-12-16 NOTE — ED Triage Notes (Signed)
Per EMS, pt called out for bilateral upper extremity weakness x 1 week. Pt has hx of spina bifida, has urinary catheter and colostomy bag. Pt does not have home health, pt is alone at home.  BP 118/80 RR 18 SpO2 98% HR 80

## 2016-12-16 NOTE — ED Notes (Signed)
Contacted EMS dispatch in an attempt to locate patients keys.  Dispatch to call this RN back after the truck is searched.

## 2016-12-16 NOTE — ED Notes (Signed)
Nurse is in the room attempting to collect blood

## 2016-12-16 NOTE — ED Notes (Signed)
Patient d/c'd self care.  F/U reviewed.  Patient verbalized understanding. 

## 2016-12-16 NOTE — ED Notes (Signed)
Bed: HC62 Expected date:  Expected time:  Means of arrival:  Comments: 60 yo M weakness x 1 week hx spina bifida

## 2016-12-16 NOTE — ED Notes (Signed)
[facility] here to transport pt back to his residence.

## 2016-12-16 NOTE — ED Notes (Signed)
Patient states that he is unable to locate his house keys.  States that EMS locked his doors when they left the house.  This RN looked in triage and patient room and was unable to locate the keys.

## 2016-12-17 ENCOUNTER — Emergency Department (HOSPITAL_COMMUNITY)
Admission: EM | Admit: 2016-12-17 | Discharge: 2016-12-17 | Disposition: A | Discharge status: Home / Self Care | Payer: Medicare HMO | Attending: Emergency Medicine | Admitting: Emergency Medicine

## 2016-12-17 DIAGNOSIS — Z79899 Other long term (current) drug therapy: Secondary | ICD-10-CM | POA: Diagnosis not present

## 2016-12-17 DIAGNOSIS — Q059 Spina bifida, unspecified: Secondary | ICD-10-CM | POA: Insufficient documentation

## 2016-12-17 DIAGNOSIS — N3000 Acute cystitis without hematuria: Secondary | ICD-10-CM | POA: Diagnosis not present

## 2016-12-17 DIAGNOSIS — I10 Essential (primary) hypertension: Secondary | ICD-10-CM | POA: Insufficient documentation

## 2016-12-17 DIAGNOSIS — W010XXA Fall on same level from slipping, tripping and stumbling without subsequent striking against object, initial encounter: Secondary | ICD-10-CM | POA: Insufficient documentation

## 2016-12-17 DIAGNOSIS — W19XXXA Unspecified fall, initial encounter: Secondary | ICD-10-CM

## 2016-12-17 DIAGNOSIS — Y999 Unspecified external cause status: Secondary | ICD-10-CM | POA: Insufficient documentation

## 2016-12-17 DIAGNOSIS — Y939 Activity, unspecified: Secondary | ICD-10-CM | POA: Diagnosis not present

## 2016-12-17 DIAGNOSIS — R531 Weakness: Secondary | ICD-10-CM | POA: Diagnosis present

## 2016-12-17 DIAGNOSIS — Y929 Unspecified place or not applicable: Secondary | ICD-10-CM | POA: Insufficient documentation

## 2016-12-17 DIAGNOSIS — Z87891 Personal history of nicotine dependence: Secondary | ICD-10-CM | POA: Insufficient documentation

## 2016-12-17 LAB — URINE CULTURE

## 2016-12-17 LAB — URINALYSIS, ROUTINE W REFLEX MICROSCOPIC
Bilirubin Urine: NEGATIVE
Glucose, UA: NEGATIVE mg/dL
Hgb urine dipstick: NEGATIVE
Ketones, ur: NEGATIVE mg/dL
NITRITE: NEGATIVE
PROTEIN: NEGATIVE mg/dL
Specific Gravity, Urine: <1.005 — ABNORMAL LOW (ref 1.005–1.030)
pH: >9 — ABNORMAL HIGH (ref 5.0–8.0)

## 2016-12-17 LAB — CBC WITH DIFFERENTIAL/PLATELET
BASOS ABS: 0 10*3/uL (ref 0.0–0.1)
BASOS PCT: 0 %
EOS ABS: 0.3 10*3/uL (ref 0.0–0.7)
EOS PCT: 5 %
HCT: 37.9 % — ABNORMAL LOW (ref 39.0–52.0)
Hemoglobin: 11.9 g/dL — ABNORMAL LOW (ref 13.0–17.0)
LYMPHS PCT: 18 %
Lymphs Abs: 1.2 10*3/uL (ref 0.7–4.0)
MCH: 27.6 pg (ref 26.0–34.0)
MCHC: 31.4 g/dL (ref 30.0–36.0)
MCV: 87.9 fL (ref 78.0–100.0)
Monocytes Absolute: 0.7 10*3/uL (ref 0.1–1.0)
Monocytes Relative: 10 %
Neutro Abs: 4.4 10*3/uL (ref 1.7–7.7)
Neutrophils Relative %: 67 %
PLATELETS: 288 10*3/uL (ref 150–400)
RBC: 4.31 MIL/uL (ref 4.22–5.81)
RDW: 16 % — ABNORMAL HIGH (ref 11.5–15.5)
WBC: 6.7 10*3/uL (ref 4.0–10.5)

## 2016-12-17 LAB — BASIC METABOLIC PANEL
Anion gap: 10 (ref 5–15)
BUN: 16 mg/dL (ref 6–20)
CALCIUM: 9 mg/dL (ref 8.9–10.3)
CO2: 19 mmol/L — ABNORMAL LOW (ref 22–32)
CREATININE: 0.92 mg/dL (ref 0.61–1.24)
Chloride: 110 mmol/L (ref 101–111)
GFR calc Af Amer: >60 mL/min (ref >60)
Glucose, Bld: 109 mg/dL — ABNORMAL HIGH (ref 65–99)
POTASSIUM: 3.7 mmol/L (ref 3.5–5.1)
SODIUM: 139 mmol/L (ref 135–145)

## 2016-12-17 LAB — URINALYSIS, MICROSCOPIC (REFLEX): RBC / HPF: NONE SEEN RBC/hpf (ref 0–5)

## 2016-12-17 NOTE — ED Triage Notes (Signed)
Patient comes in per [facility] with weakness/fall. Patient is wheelchair bound. Hx of spina bifida. Was seen at [facility] ED for weakness yest. Patient slipped out of his wheelchair. [facility] RN came today and found patient on floor. Patient was on concrete floor for est 12 hrs. No c/o of pain. Left leg scrap noted, bleedign controlled. EMS v/s 130/70, 78 HR, 16 RR. A/ox4. Colostomy present. Swollen legs.

## 2016-12-17 NOTE — ED Provider Notes (Signed)
[place] DEPT Provider Note   CSN: [id number] Arrival date & time: 12/17/16  1233     History   Chief Complaint Chief Complaint  Patient presents with  . Fall    HPI Patrick Orozco is a 61 y.o. male.  Pt presents to the ED today with weakness.  The pt was here yesterday for the same.  He was diagnosed with a UTI.  The pt is primarily wheelchair bound and slipped getting into his wheelchair.  He denies any pain.  He lives by himself since his wife died of breast cancer in 2012-02-06.  He feels like he needs help at home.      Past Medical History:  Diagnosis Date  . Colostomy in place [facility])   . Depression    wife passed July 2013  . GERD (gastroesophageal reflux disease)   . Hyperlipidemia   . Hypertension   . Kidney stones   . Kidney stones   . Neurogenic bladder    uses condom cath  . Sarcoidosis ([place])   . Shortness of breath    occassionally  . Sleep apnea    pt denies sleep apnea pt states never followed through with sleep study; pt scored 5 per stop bang tool per PAT visit 07/17/2015; results sent to PCP Dr Ayesha Rumpf  . Spina bifida   . UTI (lower urinary tract infection)     Patient Active Problem List   Diagnosis Date Noted  . Generalized weakness 10/30/2016  . Left leg cellulitis 10/30/2016  . Prolonged Q-T interval on ECG 10/30/2016  . Pressure ulcer 07/19/2015  . Staghorn kidney stones 07/18/2015  . Ureteral stone with hydronephrosis 06/15/2015  . OSA (obstructive sleep apnea) 07/13/2014  . UTI (urinary tract infection), bacterial 2/2 GNR's 01/07/2013  . H/O paraplegia 01/07/2013  . Recurrent kidney stones-non obstructive 01/07/2013  . Sarcoidosis ([place]) 01/07/2013  . Nausea and vomiting 01/05/2013  . Left flank pain 01/05/2013  . Thrombocytopenia ([place]) 01/05/2013  . Hypertension 01/05/2013  . History of spina bifida 01/05/2013    Past Surgical History:  Procedure Laterality Date  . COLOSTOMY    . CYSTOSCOPY W/ URETERAL STENT PLACEMENT Left  06/15/2015   Procedure: CYSTOSCOPY WITH STENT REPLACEMENT;  Surgeon: Raynelle Bring, MD;  Location: [facility] ORS;  Service: Urology;  Laterality: Left;  . CYSTOSCOPY/URETEROSCOPY/HOLMIUM LASER/STENT PLACEMENT Left 07/18/2015   Procedure: 1ST STAGE CYSTOSCOPY/URETEROSCOPY/HOLMIUM LASER/STENT REPLACEMENT;  Surgeon: Alexis Frock, MD;  Location: [facility] ORS;  Service: Urology;  Laterality: Left;  . CYSTOSCOPY/URETEROSCOPY/HOLMIUM LASER/STENT PLACEMENT Left 07/20/2015   Procedure: 2ND STAGE CYSTOSCOPY LEFT URETEROSCOPY STENT REPLACEMENT left retrograde;  Surgeon: Alexis Frock, MD;  Location: [facility];  Service: Urology;  Laterality: Left;  . ENDOBRONCHIAL ULTRASOUND Bilateral 04/25/2013   Procedure: ENDOBRONCHIAL ULTRASOUND;  Surgeon: Collene Gobble, MD;  Location: [facility] ENDOSCOPY;  Service: Cardiopulmonary;  Laterality: Bilateral;  . SPINE SURGERY     multiple surgeries related to spina bifida  . TOE AMPUTATION  02/05/09   toe on left foot       Home Medications    Prior to Admission medications   Medication Sig Start Date End Date Taking? Authorizing Provider  acetaminophen (TYLENOL) 325 MG tablet Take 2 tablets (650 mg total) by mouth every 6 (six) hours as needed for mild pain (or Fever >/= 101). Patient not taking: Reported on 11/13/2016 11/05/16   Kerney Elbe, DO  alum & mag hydroxide-simeth (MAALOX/MYLANTA) 200-200-20 MG/5ML suspension Take 30 mLs by mouth every 6 (six) hours as needed for indigestion  or heartburn. Patient not taking: Reported on 11/13/2016 11/05/16   Kerney Elbe, DO  cephALEXin (KEFLEX) 500 MG capsule Take 1 capsule (500 mg total) by mouth 4 (four) times daily. Patient not taking: Reported on 12/16/2016 11/14/16   Veryl Speak, MD  clindamycin (CLEOCIN) 300 MG capsule Take 1 capsule (300 mg total) by mouth 3 (three) times daily. Patient not taking: Reported on 11/13/2016 11/05/16   Kerney Elbe, DO  Fluticasone-Salmeterol (ADVAIR) 250-50 MCG/DOSE AEPB Inhale 1 puff into the lungs  2 (two) times daily as needed (shortness of breath).     Historical Provider, MD  hydrocerin (EUCERIN) CREA Apply 1 application topically 2 (two) times daily. Patient not taking: Reported on 11/13/2016 11/05/16   Kerney Elbe, DO  metoprolol (TOPROL-XL) 50 MG 24 hr tablet Take 50 mg by mouth daily.     Historical Provider, MD  nutrition supplement, JUVEN, (JUVEN) PACK Take 1 packet by mouth 3 (three) times daily. Patient not taking: Reported on 11/13/2016 11/05/16   Kerney Elbe, DO  simvastatin (ZOCOR) 20 MG tablet Take 20 mg by mouth at bedtime.     Historical Provider, MD    Family History Family History  Problem Relation Age of Onset  . Cancer Mother     ?lung  . Cancer Maternal Grandfather     lung    Social History Social History  Substance Use Topics  . Smoking status: Former Smoker    Packs/day: 0.25    Years: 22.00    Types: Cigarettes    Quit date: 11/03/1992  . Smokeless tobacco: Never Used  . Alcohol use No     Allergies   Ivp dye [iodinated diagnostic agents]   Review of Systems Review of Systems  Neurological: Positive for weakness.  All other systems reviewed and are negative.    Physical Exam Updated Vital Signs BP 124/64   Pulse 101   Ht '5\' 5"'$  (1.651 m)   Wt 198 lb (89.8 kg)   SpO2 100%   BMI 32.95 kg/m   Physical Exam  Constitutional: He is oriented to person, place, and time. He appears well-developed and well-nourished.  HENT:  Head: Normocephalic and atraumatic.  Right Ear: External ear normal.  Left Ear: External ear normal.  Nose: Nose normal.  Mouth/Throat: Oropharynx is clear and moist.  Eyes: Conjunctivae and EOM are normal. Pupils are equal, round, and reactive to light.  Neck: Normal range of motion. Neck supple.  Cardiovascular: Normal rate, regular rhythm, normal heart sounds and intact distal pulses.   Pulmonary/Chest: Effort normal and breath sounds normal.  Abdominal: Soft. Bowel sounds are normal.  Musculoskeletal:  Normal range of motion. He exhibits edema.  Neurological: He is alert and oriented to person, place, and time.  Bilateral LE weakness (chronic due to spina bifida)  Skin: Skin is warm.  Chronic skin changes bilateral LE  Nursing note and vitals reviewed.    ED Treatments / Results  Labs (all labs ordered are listed, but only abnormal results are displayed) Labs Reviewed  BASIC METABOLIC PANEL - Abnormal; Notable for the following:       Result Value   CO2 19 (*)    Glucose, Bld 109 (*)    All other components within normal limits  CBC WITH DIFFERENTIAL/PLATELET - Abnormal; Notable for the following:    Hemoglobin 11.9 (*)    HCT 37.9 (*)    RDW 16.0 (*)    All other components within normal limits  URINALYSIS, ROUTINE  W REFLEX MICROSCOPIC - Abnormal; Notable for the following:    APPearance TURBID (*)    Specific Gravity, Urine <1.005 (*)    pH >9.0 (*)    Leukocytes, UA TRACE (*)    All other components within normal limits  URINALYSIS, MICROSCOPIC (REFLEX) - Abnormal; Notable for the following:    Bacteria, UA MANY (*)    Squamous Epithelial / LPF 0-5 (*)    Crystals TRIPLE PHOSPHATE CRYSTALS (*)    All other components within normal limits    EKG  EKG Interpretation None       Radiology Dg Chest 2 View  Result Date: 12/16/2016 CLINICAL DATA:  Shortness of breath. EXAM: CHEST  2 VIEW COMPARISON:  Radiographs of November 09, 2016. FINDINGS: The heart size and mediastinal contours are within normal limits. Both lungs are clear. No pneumothorax or pleural effusion is noted. The visualized skeletal structures are unremarkable. IMPRESSION: No active cardiopulmonary disease. Electronically Signed   By: Marijo Conception, M.D.   On: 12/16/2016 16:16    Procedures Procedures (including critical care time)  Medications Ordered in ED Medications - No data to display   Initial Impression / Assessment and Plan / ED Course  I have reviewed the triage vital signs and the  nursing notes.  Pertinent labs & imaging results that were available during my care of the patient were reviewed by me and considered in my medical decision making (see chart for details).    Pt d/w case management regarding home health.  He has had [facility] in the past, and they are willing to take him back.  A new face to face order placed.  Pt's urine will be sent for culture.  Pt knows to f/u with pcp.  Final Clinical Impressions(s) / ED Diagnoses   Final diagnoses:  Fall, initial encounter  Acute cystitis without hematuria  Spina bifida, unspecified hydrocephalus presence, unspecified spinal region [facility])    New Prescriptions New Prescriptions   No medications on file     Isla Pence, MD 12/17/16 1454

## 2016-12-17 NOTE — Discharge Planning (Signed)
Pt currently active with [facility]  for RN/PT services.  Resumption of care requested. [name] Yonjof, RN of [facility] notified.  No DME needs identified at this time.  

## 2016-12-18 LAB — URINE CULTURE

## 2016-12-19 ENCOUNTER — Ambulatory Visit (HOSPITAL_BASED_OUTPATIENT_CLINIC_OR_DEPARTMENT_OTHER): Payer: Medicare HMO

## 2016-12-28 ENCOUNTER — Inpatient Hospital Stay (HOSPITAL_COMMUNITY)
Admission: EM | Admit: 2016-12-28 | Discharge: 2017-01-01 | DRG: 871 | Disposition: A | Discharge status: Skilled Nursing Facility | Payer: Medicare HMO | Attending: Internal Medicine | Admitting: Internal Medicine

## 2016-12-28 ENCOUNTER — Emergency Department (HOSPITAL_COMMUNITY): Payer: Medicare HMO

## 2016-12-28 ENCOUNTER — Encounter (HOSPITAL_COMMUNITY): Payer: Self-pay | Admitting: Emergency Medicine

## 2016-12-28 DIAGNOSIS — Z87891 Personal history of nicotine dependence: Secondary | ICD-10-CM | POA: Diagnosis not present

## 2016-12-28 DIAGNOSIS — L89313 Pressure ulcer of right buttock, stage 3: Secondary | ICD-10-CM | POA: Diagnosis present

## 2016-12-28 DIAGNOSIS — L89323 Pressure ulcer of left buttock, stage 3: Secondary | ICD-10-CM | POA: Diagnosis present

## 2016-12-28 DIAGNOSIS — E860 Dehydration: Secondary | ICD-10-CM | POA: Diagnosis present

## 2016-12-28 DIAGNOSIS — E870 Hyperosmolality and hypernatremia: Secondary | ICD-10-CM | POA: Diagnosis present

## 2016-12-28 DIAGNOSIS — L8944 Pressure ulcer of contiguous site of back, buttock and hip, stage 4: Secondary | ICD-10-CM

## 2016-12-28 DIAGNOSIS — G4733 Obstructive sleep apnea (adult) (pediatric): Secondary | ICD-10-CM | POA: Diagnosis present

## 2016-12-28 DIAGNOSIS — L8990 Pressure ulcer of unspecified site, unspecified stage: Secondary | ICD-10-CM | POA: Diagnosis present

## 2016-12-28 DIAGNOSIS — K219 Gastro-esophageal reflux disease without esophagitis: Secondary | ICD-10-CM | POA: Diagnosis present

## 2016-12-28 DIAGNOSIS — D869 Sarcoidosis, unspecified: Secondary | ICD-10-CM | POA: Diagnosis present

## 2016-12-28 DIAGNOSIS — G8220 Paraplegia, unspecified: Secondary | ICD-10-CM | POA: Diagnosis present

## 2016-12-28 DIAGNOSIS — Z87728 Personal history of other specified (corrected) congenital malformations of nervous system and sense organs: Secondary | ICD-10-CM

## 2016-12-28 DIAGNOSIS — E669 Obesity, unspecified: Secondary | ICD-10-CM | POA: Diagnosis present

## 2016-12-28 DIAGNOSIS — N319 Neuromuscular dysfunction of bladder, unspecified: Secondary | ICD-10-CM | POA: Diagnosis present

## 2016-12-28 DIAGNOSIS — L03116 Cellulitis of left lower limb: Secondary | ICD-10-CM

## 2016-12-28 DIAGNOSIS — Z87442 Personal history of urinary calculi: Secondary | ICD-10-CM | POA: Diagnosis not present

## 2016-12-28 DIAGNOSIS — W19XXXA Unspecified fall, initial encounter: Secondary | ICD-10-CM | POA: Diagnosis present

## 2016-12-28 DIAGNOSIS — A419 Sepsis, unspecified organism: Secondary | ICD-10-CM | POA: Diagnosis present

## 2016-12-28 DIAGNOSIS — E871 Hypo-osmolality and hyponatremia: Secondary | ICD-10-CM | POA: Diagnosis present

## 2016-12-28 DIAGNOSIS — Z933 Colostomy status: Secondary | ICD-10-CM | POA: Diagnosis not present

## 2016-12-28 DIAGNOSIS — E872 Acidosis, unspecified: Secondary | ICD-10-CM

## 2016-12-28 DIAGNOSIS — Z993 Dependence on wheelchair: Secondary | ICD-10-CM | POA: Diagnosis not present

## 2016-12-28 DIAGNOSIS — I10 Essential (primary) hypertension: Secondary | ICD-10-CM | POA: Diagnosis present

## 2016-12-28 DIAGNOSIS — Z7401 Bed confinement status: Secondary | ICD-10-CM

## 2016-12-28 DIAGNOSIS — L89150 Pressure ulcer of sacral region, unstageable: Secondary | ICD-10-CM | POA: Diagnosis present

## 2016-12-28 DIAGNOSIS — Z8669 Personal history of other diseases of the nervous system and sense organs: Secondary | ICD-10-CM | POA: Diagnosis not present

## 2016-12-28 DIAGNOSIS — E785 Hyperlipidemia, unspecified: Secondary | ICD-10-CM | POA: Diagnosis present

## 2016-12-28 LAB — URINALYSIS, ROUTINE W REFLEX MICROSCOPIC
BILIRUBIN URINE: NEGATIVE
Glucose, UA: NEGATIVE mg/dL
KETONES UR: 5 mg/dL — AB
Nitrite: NEGATIVE
PROTEIN: NEGATIVE mg/dL
SPECIFIC GRAVITY, URINE: 1.019 (ref 1.005–1.030)
pH: 5 (ref 5.0–8.0)

## 2016-12-28 LAB — COMPREHENSIVE METABOLIC PANEL
ALT: 20 U/L (ref 17–63)
ANION GAP: 10 (ref 5–15)
AST: 30 U/L (ref 15–41)
Albumin: 2.4 g/dL — ABNORMAL LOW (ref 3.5–5.0)
Alkaline Phosphatase: 61 U/L (ref 38–126)
BUN: 30 mg/dL — ABNORMAL HIGH (ref 6–20)
CALCIUM: 9.2 mg/dL (ref 8.9–10.3)
CHLORIDE: 119 mmol/L — AB (ref 101–111)
CO2: 22 mmol/L (ref 22–32)
Creatinine, Ser: 1.09 mg/dL (ref 0.61–1.24)
GFR calc Af Amer: >60 mL/min (ref >60)
GFR calc non Af Amer: >60 mL/min (ref >60)
Glucose, Bld: 109 mg/dL — ABNORMAL HIGH (ref 65–99)
Potassium: 4.1 mmol/L (ref 3.5–5.1)
SODIUM: 151 mmol/L — AB (ref 135–145)
Total Bilirubin: 0.7 mg/dL (ref 0.3–1.2)
Total Protein: 7.9 g/dL (ref 6.5–8.1)

## 2016-12-28 LAB — CBC WITH DIFFERENTIAL/PLATELET
Basophils Absolute: 0 10*3/uL (ref 0.0–0.1)
Basophils Relative: 0 %
EOS ABS: 0 10*3/uL (ref 0.0–0.7)
EOS PCT: 1 %
HCT: 38.3 % — ABNORMAL LOW (ref 39.0–52.0)
Hemoglobin: 12.3 g/dL — ABNORMAL LOW (ref 13.0–17.0)
LYMPHS ABS: 0.9 10*3/uL (ref 0.7–4.0)
Lymphocytes Relative: 14 %
MCH: 27.8 pg (ref 26.0–34.0)
MCHC: 32.1 g/dL (ref 30.0–36.0)
MCV: 86.5 fL (ref 78.0–100.0)
MONOS PCT: 7 %
Monocytes Absolute: 0.4 10*3/uL (ref 0.1–1.0)
Neutro Abs: 4.9 10*3/uL (ref 1.7–7.7)
Neutrophils Relative %: 78 %
PLATELETS: 167 10*3/uL (ref 150–400)
RBC: 4.43 MIL/uL (ref 4.22–5.81)
RDW: 16.7 % — AB (ref 11.5–15.5)
WBC: 6.3 10*3/uL (ref 4.0–10.5)

## 2016-12-28 LAB — PROTIME-INR
INR: 1.5
PROTHROMBIN TIME: 18.3 s — AB (ref 11.4–15.2)

## 2016-12-28 LAB — I-STAT TROPONIN, ED: TROPONIN I, POC: 0 ng/mL (ref 0.00–0.08)

## 2016-12-28 LAB — I-STAT CG4 LACTIC ACID, ED: Lactic Acid, Venous: 3.7 mmol/L (ref 0.5–1.9)

## 2016-12-28 LAB — APTT: APTT: 33 s (ref 24–36)

## 2016-12-28 LAB — PROCALCITONIN: PROCALCITONIN: 1.89 ng/mL

## 2016-12-28 LAB — LACTIC ACID, PLASMA: Lactic Acid, Venous: 2.8 mmol/L (ref 0.5–1.9)

## 2016-12-28 MED ORDER — PIPERACILLIN-TAZOBACTAM 3.375 G IVPB
3.3750 g | Freq: Three times a day (TID) | INTRAVENOUS | Status: DC
  Administered 2016-12-28 – 2016-12-31 (×8): 3.375 g via INTRAVENOUS
  Filled 2016-12-28 (×8): qty 50

## 2016-12-28 MED ORDER — SODIUM CHLORIDE 0.9 % IV BOLUS (SEPSIS)
500.0000 mL | Freq: Once | INTRAVENOUS | Status: AC
  Administered 2016-12-28: 500 mL via INTRAVENOUS

## 2016-12-28 MED ORDER — SODIUM CHLORIDE 0.9 % IV SOLN
1500.0000 mg | Freq: Once | INTRAVENOUS | Status: AC
  Administered 2016-12-28: 1500 mg via INTRAVENOUS
  Filled 2016-12-28: qty 1500

## 2016-12-28 MED ORDER — VANCOMYCIN HCL IN DEXTROSE 1-5 GM/200ML-% IV SOLN
1000.0000 mg | Freq: Two times a day (BID) | INTRAVENOUS | Status: DC
  Administered 2016-12-29: 1000 mg via INTRAVENOUS
  Filled 2016-12-28: qty 200

## 2016-12-28 MED ORDER — SODIUM CHLORIDE 0.9% FLUSH
3.0000 mL | Freq: Two times a day (BID) | INTRAVENOUS | Status: DC
  Administered 2016-12-31 – 2017-01-01 (×2): 3 mL via INTRAVENOUS

## 2016-12-28 MED ORDER — SODIUM CHLORIDE 0.9 % IV BOLUS (SEPSIS)
1000.0000 mL | Freq: Once | INTRAVENOUS | Status: AC
  Administered 2016-12-28: 1000 mL via INTRAVENOUS

## 2016-12-28 MED ORDER — COLLAGENASE 250 UNIT/GM EX OINT
TOPICAL_OINTMENT | Freq: Every day | CUTANEOUS | Status: DC
  Administered 2016-12-28 – 2017-01-01 (×5): via TOPICAL
  Filled 2016-12-28 (×2): qty 30

## 2016-12-28 MED ORDER — ACETAMINOPHEN 650 MG RE SUPP: 650.0000 mg | Freq: Four times a day (QID) | RECTAL | Status: DC | PRN

## 2016-12-28 MED ORDER — TRAMADOL HCL 50 MG PO TABS
50.0000 mg | ORAL_TABLET | Freq: Four times a day (QID) | ORAL | Status: DC | PRN
  Administered 2016-12-30 – 2017-01-01 (×3): 50 mg via ORAL
  Filled 2016-12-28 (×3): qty 1

## 2016-12-28 MED ORDER — SIMVASTATIN 20 MG PO TABS
20.0000 mg | ORAL_TABLET | Freq: Every day | ORAL | Status: DC
  Administered 2016-12-28 – 2016-12-31 (×4): 20 mg via ORAL
  Filled 2016-12-28 (×4): qty 1

## 2016-12-28 MED ORDER — ACETAMINOPHEN 325 MG PO TABS: 650.0000 mg | ORAL_TABLET | Freq: Four times a day (QID) | ORAL | Status: DC | PRN

## 2016-12-28 MED ORDER — PIPERACILLIN-TAZOBACTAM 3.375 G IVPB 30 MIN
3.3750 g | Freq: Once | INTRAVENOUS | Status: AC
  Administered 2016-12-28: 3.375 g via INTRAVENOUS
  Filled 2016-12-28: qty 50

## 2016-12-28 MED ORDER — ENOXAPARIN SODIUM 40 MG/0.4ML ~~LOC~~ SOLN
40.0000 mg | SUBCUTANEOUS | Status: DC
  Administered 2016-12-28 – 2016-12-31 (×4): 40 mg via SUBCUTANEOUS
  Filled 2016-12-28 (×4): qty 0.4

## 2016-12-28 MED ORDER — FENTANYL CITRATE (PF) 100 MCG/2ML IJ SOLN
50.0000 ug | Freq: Once | INTRAMUSCULAR | Status: AC
  Administered 2016-12-28: 50 ug via INTRAVENOUS
  Filled 2016-12-28: qty 2

## 2016-12-28 MED ORDER — SODIUM CHLORIDE 0.9 % IV BOLUS (SEPSIS)
2000.0000 mL | Freq: Once | INTRAVENOUS | Status: AC
  Administered 2016-12-28: 2000 mL via INTRAVENOUS

## 2016-12-28 NOTE — Consult Note (Signed)
[facility] Nurse ostomy consult note: Patient known to our department from previous admissions.  Last seen by this writer on 10/31/17, has had significant decline in ability to perform self care since that time.  Pressure injury (Unstageable) to coccyx is new and concerning.  Patient reports he has been in wheelchair for long periods of time for a long time.  Will not elaborate. Will recommend surgical consult. Have ordered hydrotherapy to begin on 12/29/16. Patient reports he no longer has HHRN [facility]) and is vague about the reason that [facility] is no longer coming.  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 Pouch in place is leaking.  Patient cannot recall when it was changed last and states he is independent in management. Stomal assessment/size: Oval stoma in a peristomal depression. Pink, moist. 1 and 1/2 inches x 2 and 1/4 inches and slightly elevated.   Peristomal assessment: Intact.  Treatment options for stomal/peristomal skin: Skin barrier rings (takes 1.5 rings to encircle stoma) Output: light brown soft stool Ostomy pouching: 2pc. 4-inch pouching system placed today with skin barrier rings.  Would be able to use 2 and 3/4 inch pouching system with next pouch change. Education provided: None. I have concerns that patient is no longer able to perform self care. Enrolled patient in [place] program: No   [facility] Nurse wound consult note Reason for Consult:Numerous skin injuries, pressure, moisture (IAD from urine and ITD) Wound type: Pressure,  venous insufficiency,  Moisture, trauma (fall) Pressure Injury POA: Yes Measurements: Right medial knee:  Full thickness: 1.5cm x 2cm x 0.2cm with pink, moist wound bed. Scant amount serous exudate. Right lateral LE:  Two intact blood-filled blisters (DTPI), proximal is hard and measures 1.5cm x 2.5cm (Unstagebale), distal measures 1.5cm x 3cm. NO exudate. Right lateral foot (DTPI) measures 4.5cm x 1.5cm (no  exudate). Right foot toes:  Scattered areas of dry, stable eschar (>5), the largest of which measures 1cm round. No exudate. Left lateral LE 1.5cm x 2cm with depth obscured by the presence of yellow slough (Unstageable), no exudate Left medial heel (posterior) 2.5cm x 3cm Dark tissue with no fluctuance. DTPI Skin tear on upper medial left thigh measuring 3cm x 1cm x 0.2cm. Pink, moist. No exudate. MASD to scrotum, penis, medial thighs Moisture and maceration in the bilateral inguinal skin folds, also the sub pannicular skin folds Coccygeal Pressure Injury (Unstageable)  12cm x 18cm with depth obscured by the presence of necrotic firmly adherent, grey/white eschar. Left posterior thigh:  Full thickness Stage 3 measuring 7cm x 7cm x 0.1cm. Wound is red, moist. Wound bed: As described above. Drainage (amount, consistency, odor) Weeping from bilateral LEs previously reported has ceased with elevation. Others as described above. Periwound:Maceration  Dressing procedure/placement/frequency: Mattress replacement with low air loss feature, Bilateral Prevalon Boots provided today.  Nursing is provided with guidance for topical care, mostly conservative to LEs and posterior left thigh wounds, for MASD (IAD and ITD).  The coccygeal wound requires consultation by [facility].  If you agree, please order/arrange.  In the meantime, I have ordered PT for hydrotherapy 6x/week with collagenase enzymatic debridement agent topped with NS for wound care to begin tomorrow (Monday, 12/29/16).  It appears that the patient requires a high level of care than he is currently getting.  Please order Case Management/CSW for their input.  A nutritional consult is also indicated as patient may not be able to take in the calories required for tissue repair and regeneration. If you  agree, please order.  [place] nursing team will not follow, but will remain available to this patient, the nursing and medical teams.  Please re-consult if  needed. Thanks, Maudie Flakes, MSN, RN, [place], [facility]  Pager# [phone]

## 2016-12-28 NOTE — Progress Notes (Signed)
CRITICAL VALUE ALERT  Critical value received:  Lactic Acid 2.8  Date of notification:  12/28/16  Time of notification:  2126  Critical value read back: yes  Nurse who received alert: Luberta Mutter  MD notified (1st page):  Schorr  Time of first page:  2203  MD notified (2nd page):  Time of second page:  Responding MD:  Schorr  Time MD responded:  2212

## 2016-12-28 NOTE — ED Triage Notes (Signed)
Per ems patient from home for falling out of wheelchair and laid on floor all night before calling 911.  Patient c/o left hip area pain that was hurting before the fall.  Patient is paraplegic and reports he lives alone.  Patient has pressure sores that are draining.

## 2016-12-28 NOTE — ED Notes (Signed)
Patient given heart healthy tray for lunch.

## 2016-12-28 NOTE — Progress Notes (Signed)
Pharmacy Antibiotic Note  Patrick Orozco is a 61 y.o. male admitted on 12/28/2016 with cellulitis  Presented following fall.  Pharmacy has been consulted for vancomycin and zosyn dosing.  PMH significant for spinal bifida with paraplegia, known sacral decub ulcers, foot ulcers that have been draining recently, neurogenic bladder with chronic condom cath.  Plan: Vancomycin 1500 mg x 1 then 1g q12h. VT goal 10-15 mcg/ml. Zosyn 3.375g IV q8h (4 hour infusion time).      Temp (24hrs), Avg:97.8 F (36.6 C), Min:97.8 F (36.6 C), Max:97.8 F (36.6 C)   Recent Labs Lab 12/28/16 1157  LATICACIDVEN 3.70*    Estimated Creatinine Clearance: 86.8 mL/min (by C-G formula based on SCr of 0.92 mg/dL).    Allergies  Allergen Reactions  . Ivp Dye [Iodinated Diagnostic Agents] Itching    Antimicrobials this admission: 2/25 vanc >>  2/25 zosyn >>   Dose adjustments this admission:   Microbiology results: 2/25 BCx: sent 2/25 UCx: sent   Thank you for allowing pharmacy to be a part of this patient's care.  Hershal Coria 12/28/2016 12:34 PM

## 2016-12-28 NOTE — H&P (Addendum)
History and Physical  Patrick Orozco PXT:[id number] DOB: 1955/12/25 DOA: 12/28/2016  Referring physician: Shirlyn Goltz, ER physician  PCP: Kristine Garbe, MD  Outpatient Specialists: None Patient coming from: Home & is able to ambulate using a wheelchair  Chief Complaint: Fall   HPI: Patrick Orozco is a 61 y.o. male with medical history significant of spina bifida and paraplegia who lives at home by himself and is wheelchair-bound presented to the emergency room after a fall today where he got out of bed and missed his wheelchair. He landed on his left side with significant pain. Paramedics were called.  ED Course: In the emergency room, x-rays noted no acute fracture. Patient has some chronic deformity of that hip and knee. Labs were noteworthy for a white count of 6.3 but with 78% neutrophil shift, sodium of 151 and a lactic acid level of 3.7. Urinalysis and chest x-ray are unremarkable, but patient had multiple decubitus wounds and upon examination, his anterior aspect of right lower extremity looked to be warm, possibly concerning for cellulitis. Hospitalists were called for further evaluation and admission. Patient was given IV fluids and broad-spectrum antibiotics.  Review of Systems: Patient seen in the emergency room. Pt complains of some soreness on his left side   Pt denies any headaches, vision changes, dysphagia, chest pain, palpitations, shortness of breath, wheeze, cough, abdominal pain, constipation or diarrhea .  Review of systems are otherwise negative   Past Medical History:  Diagnosis Date  . Colostomy in place [facility])   . Depression    wife passed July 2013  . GERD (gastroesophageal reflux disease)   . Hyperlipidemia   . Hypertension   . Kidney stones   . Kidney stones   . Neurogenic bladder    uses condom cath  . Sarcoidosis ([place])   . Shortness of breath    occassionally  . Sleep apnea    pt denies sleep apnea pt states never followed through with sleep study;  pt scored 5 per stop bang tool per PAT visit 07/17/2015; results sent to PCP Dr Ayesha Rumpf  . Spina bifida   . UTI (lower urinary tract infection)    Past Surgical History:  Procedure Laterality Date  . COLOSTOMY    . CYSTOSCOPY W/ URETERAL STENT PLACEMENT Left 06/15/2015   Procedure: CYSTOSCOPY WITH STENT REPLACEMENT;  Surgeon: Raynelle Bring, MD;  Location: [facility];  Service: Urology;  Laterality: Left;  . CYSTOSCOPY/URETEROSCOPY/HOLMIUM LASER/STENT PLACEMENT Left 07/18/2015   Procedure: 1ST STAGE CYSTOSCOPY/URETEROSCOPY/HOLMIUM LASER/STENT REPLACEMENT;  Surgeon: Alexis Frock, MD;  [facility];  Service: Urology;  Laterality: Left;  . CYSTOSCOPY/URETEROSCOPY/HOLMIUM LASER/STENT PLACEMENT Left 07/20/2015   Procedure: 2ND STAGE CYSTOSCOPY LEFT URETEROSCOPY STENT REPLACEMENT left retrograde;  Surgeon: Alexis Frock, MD;  [facility];  Service: Urology;  Laterality: Left;  . ENDOBRONCHIAL ULTRASOUND Bilateral 04/25/2013   Procedure: ENDOBRONCHIAL ULTRASOUND;  Surgeon: Collene Gobble, MD;  [facility];  Service: Cardiopulmonary;  Laterality: Bilateral;  . SPINE SURGERY     multiple surgeries related to spina bifida  . TOE AMPUTATION  2010   toe on left foot    Social History:  reports that he quit smoking about 24 years ago. His smoking use included Cigarettes. He has a 5.50 pack-year smoking history. He has never used smokeless tobacco. He reports that he does not drink alcohol or use drugs.  He lives at home by himself in his apartment. There is no family   Allergies  Allergen Reactions  . Ivp Dye [Iodinated  Diagnostic Agents] Itching    Family History  Problem Relation Age of Onset  . Cancer Mother     ?lung  . Cancer Maternal Grandfather     lung      Prior to Admission medications   Medication Sig Start Date End Date Taking? Authorizing Provider  metoprolol (TOPROL-XL) 50 MG 24 hr tablet Take 50 mg by mouth daily.    Yes Historical Provider, MD  simvastatin  (ZOCOR) 20 MG tablet Take 20 mg by mouth at bedtime.    Yes Historical Provider, MD    Physical Exam: BP 122/69   Pulse 119   Temp 97.8 F (36.6 C) (Oral)   Resp 21   SpO2 100%   General:  Alert and oriented 3, no acute distress Eyes: Sclera nonicteric, extraocular movements are intact. He is exotropic  ENT: Normocephalic, atraumatic, mucous members are slightly dry  Neck: Supple, no JVD  Cardiovascular: Regular rhythm, tachycardic  Respiratory: Clear to auscultation bilaterally  Abdomen: Soft, nontender, nondistended, positive bowel sounds  Skin and musculoskeletal: Patient has chronic venous stasis with significant swelling and induration of the left leg, looks like Charcot. He is a chronic deformity leading to an externally rotated leg on that side. This side is cooler than the right. On his right side, he has signs of 1+ pitting edema with erythema from the knee down to the foot. This site is warm to touch. Multiple decubitus ulcers on the back side Psychiatric: Patient is appropriate, no evidence of psychoses  Neurologic: Chronic lower extremity paraplegia           Labs on Admission:  Basic Metabolic Panel:  Recent Labs Lab 12/28/16 1147  NA 151*  K 4.1  CL 119*  CO2 22  GLUCOSE 109*  BUN 30*  CREATININE 1.09  CALCIUM 9.2   Liver Function Tests:  Recent Labs Lab 12/28/16 1147  AST 30  ALT 20  ALKPHOS 61  BILITOT 0.7  PROT 7.9  ALBUMIN 2.4*   No results for input(s): LIPASE, AMYLASE in the last 168 hours. No results for input(s): AMMONIA in the last 168 hours. CBC:  Recent Labs Lab 12/28/16 1147  WBC 6.3  NEUTROABS 4.9  HGB 12.3*  HCT 38.3*  MCV 86.5  PLT 167   Cardiac Enzymes: No results for input(s): CKTOTAL, CKMB, CKMBINDEX, TROPONINI in the last 168 hours.  BNP (last 3 results) No results for input(s): BNP in the last 8760 hours.  ProBNP (last 3 results) No results for input(s): PROBNP in the last 8760 hours.  CBG: No results for  input(s): GLUCAP in the last 168 hours.  Radiological Exams on Admission: Dg Chest 1 View  Result Date: 12/28/2016 CLINICAL DATA:  Fall with pain.  Paraplegia.  Initial encounter. EXAM: CHEST 1 VIEW COMPARISON:  12/16/2016 and prior chest radiographs FINDINGS: The cardiomediastinal silhouette is unremarkable. There is no evidence of focal airspace disease, pulmonary edema, suspicious pulmonary nodule/mass, pleural effusion, or pneumothorax. No acute bony abnormalities are identified. IMPRESSION: No active disease. Electronically Signed   By: Margarette Canada M.D.   On: 12/28/2016 13:16   Dg Pelvis 1-2 Views  Result Date: 12/28/2016 CLINICAL DATA:  Fall and injury to pelvis.  Initial encounter. EXAM: PELVIS - 1-2 VIEW COMPARISON:  06/15/2015 CT and prior studies FINDINGS: No acute fracture identified. Deformity of both hips again noted. No suspicious focal bony lesions identified. IMPRESSION: No acute abnormality. Electronically Signed   By: Margarette Canada M.D.   On: 12/28/2016 13:18  Dg Tibia/fibula Left  Result Date: 12/28/2016 CLINICAL DATA:  Fall with left lower leg injury.  Initial encounter. EXAM: LEFT TIBIA AND FIBULA - 2 VIEW COMPARISON:  05/30/2016 radiographs FINDINGS: No acute fracture. Chronic deformity within the ankle and foot noted. No acute abnormalities are identified. IMPRESSION: No acute abnormalities. Electronically Signed   By: Margarette Canada M.D.   On: 12/28/2016 13:21   Dg Foot Complete Left  Result Date: 12/28/2016 CLINICAL DATA:  Fall with left foot injury.  Initial encounter. EXAM: LEFT FOOT - COMPLETE 3+ VIEW COMPARISON:  05/30/2016 FINDINGS: An equivocal nondisplaced fracture of the distal metatarsal on the lateral view noted. Soft tissue swelling is present. Chronic deformity within the ankle and foot again noted. IMPRESSION: Equivocal nondisplaced fracture of the distal metatarsal on the lateral view. Electronically Signed   By: Margarette Canada M.D.   On: 12/28/2016 13:24   Dg Femur  Min 2 Views Left  Result Date: 12/28/2016 CLINICAL DATA:  Fall with left leg injury.  Initial encounter. EXAM: LEFT FEMUR 2 VIEWS COMPARISON:  None. FINDINGS: No acute fracture or dislocation identified. Chronic deformity in the hip and knee noted. No suspicious focal bony lesions identified. IMPRESSION: No acute abnormality. Electronically Signed   By: Margarette Canada M.D.   On: 12/28/2016 13:20    EKG: Independently reviewed. Sinus tachycardia   Assessment/Plan Present on Admission: . Hypertension . OSA (obstructive sleep apnea) . Pressure ulcer . Neurogenic bladder . Sepsis ([place]) . Fall . Obesity (BMI 30-39.9) . Hypernatremia . Hyperlipidemia  Principal Problem:   Sepsis [facility]): Patient meets criteria for sepsis on admission given tachycardia, lactic acidosis and sources which could include right lower extremity cellulitis and multiple decubitus ulcers. For now, we'll treat with broad-spectrum antibiotics and await blood cultures. Active Problems:   Hypertension: Holding antihypertensives until confirmed sepsis resolved   History of spina bifida with secondary paraplegia and neurogenic bladder with chronic Foley catheter: Urine notes no evidence of infection. Air mattress overlay.    OSA (obstructive sleep apnea): Nightly CPAP.   Pressure ulcer: Wound care to see      Obesity (BMI 30-39.9): Patient meets criteria with BMI greater than 30   Hypernatremia: Dehydrated. Treated with IV fluids.   Hyperlipidemia: Continue statin   DVT prophylaxis: Lovenox   Code Status: Full code as per patient   Family Communication: None. Patient lives at home by himself. No family around   Disposition Plan: We'll be in hospital for several days treating sepsis   Consults called: None   Admission status: Given the patient will have to be hospital for several days requiring inpatient services, admitted as inpatient     Annita Brod MD [facility] Pager [phone]  If 7PM-7AM,  please contact night-coverage [email or web address] Password [email or web address]  12/28/2016, 2:45 PM

## 2016-12-28 NOTE — Progress Notes (Signed)
Patient was placed in prevalon boots and on air mattress overlay.

## 2016-12-28 NOTE — ED Provider Notes (Signed)
[facility] DEPT Provider Note   CSN: [id number] Arrival date & time: 12/28/16  1019     History   Chief Complaint Chief Complaint  Patient presents with  . Fall  . Hip Pain    left    HPI Patrick Orozco is a 61 y.o. male hx of GERD, spinal bifida with paraplegia, neurogenic bladder with chronic condom cath, here with fall. Patient is from home and does have home care. He is wheelchair-bound and claims that he slipped off his wheelchair and leg on the left hip. Denies any head injury or loss of consciousness. Patient was seen here several days ago for weakness and was diagnosed with UTI and still on abx. Patient's labs were unremarkable at that time. Patient has known sacral decub ulcers, foot ulcers that has been draining more recently.    The history is provided by the patient.    Past Medical History:  Diagnosis Date  . Colostomy in place [facility])   . Depression    wife passed July 2013  . GERD (gastroesophageal reflux disease)   . Hyperlipidemia   . Hypertension   . Kidney stones   . Kidney stones   . Neurogenic bladder    uses condom cath  . Sarcoidosis ([place])   . Shortness of breath    occassionally  . Sleep apnea    pt denies sleep apnea pt states never followed through with sleep study; pt scored 5 per stop bang tool per PAT visit 07/17/2015; results sent to PCP Dr Ayesha Rumpf  . Spina bifida   . UTI (lower urinary tract infection)     Patient Active Problem List   Diagnosis Date Noted  . Generalized weakness 10/30/2016  . Left leg cellulitis 10/30/2016  . Prolonged Q-T interval on ECG 10/30/2016  . Pressure ulcer 07/19/2015  . [place] kidney stones 07/18/2015  . Ureteral stone with hydronephrosis 06/15/2015  . OSA (obstructive sleep apnea) 07/13/2014  . UTI (urinary tract infection), bacterial 2/2 GNR's 01/07/2013  . H/O paraplegia 01/07/2013  . Recurrent kidney stones-non obstructive 01/07/2013  . Sarcoidosis ([place]) 01/07/2013  . Nausea and vomiting  01/05/2013  . Left flank pain 01/05/2013  . Thrombocytopenia ([place]) 01/05/2013  . Hypertension 01/05/2013  . History of spina bifida 01/05/2013    Past Surgical History:  Procedure Laterality Date  . COLOSTOMY    . CYSTOSCOPY W/ URETERAL STENT PLACEMENT Left 06/15/2015   Procedure: CYSTOSCOPY WITH STENT REPLACEMENT;  Surgeon: Raynelle Bring, MD;  [facility];  Service: Urology;  Laterality: Left;  . CYSTOSCOPY/URETEROSCOPY/HOLMIUM LASER/STENT PLACEMENT Left 07/18/2015   Procedure: 1ST STAGE CYSTOSCOPY/URETEROSCOPY/HOLMIUM LASER/STENT REPLACEMENT;  Surgeon: Alexis Frock, MD;  [facility];  Service: Urology;  Laterality: Left;  . CYSTOSCOPY/URETEROSCOPY/HOLMIUM LASER/STENT PLACEMENT Left 07/20/2015   Procedure: 2ND STAGE CYSTOSCOPY LEFT URETEROSCOPY STENT REPLACEMENT left retrograde;  Surgeon: Alexis Frock, MD;  [facility];  Service: Urology;  Laterality: Left;  . ENDOBRONCHIAL ULTRASOUND Bilateral 04/25/2013   Procedure: ENDOBRONCHIAL ULTRASOUND;  Surgeon: Collene Gobble, MD;  [facility];  Service: Cardiopulmonary;  Laterality: Bilateral;  . SPINE SURGERY     multiple surgeries related to spina bifida  . TOE AMPUTATION  2010   toe on left foot       Home Medications    Prior to Admission medications   Medication Sig Start Date End Date Taking? Authorizing Provider  acetaminophen (TYLENOL) 325 MG tablet Take 2 tablets (650 mg total) by mouth every 6 (six) hours as needed for mild pain (  or Fever >/= 101). Patient not taking: Reported on 11/13/2016 11/05/16   Kerney Elbe, DO  alum & mag hydroxide-simeth (MAALOX/MYLANTA) 200-200-20 MG/5ML suspension Take 30 mLs by mouth every 6 (six) hours as needed for indigestion or heartburn. Patient not taking: Reported on 11/13/2016 11/05/16   Kerney Elbe, DO  cephALEXin (KEFLEX) 500 MG capsule Take 1 capsule (500 mg total) by mouth 4 (four) times daily. Patient not taking: Reported on 12/16/2016 11/14/16   Veryl Speak, MD  clindamycin (CLEOCIN) 300 MG capsule Take 1 capsule (300 mg total) by mouth 3 (three) times daily. Patient not taking: Reported on 11/13/2016 11/05/16   Kerney Elbe, DO  Fluticasone-Salmeterol (ADVAIR) 250-50 MCG/DOSE AEPB Inhale 1 puff into the lungs 2 (two) times daily as needed (shortness of breath).     Historical Provider, MD  hydrocerin (EUCERIN) CREA Apply 1 application topically 2 (two) times daily. Patient not taking: Reported on 11/13/2016 11/05/16   Kerney Elbe, DO  metoprolol (TOPROL-XL) 50 MG 24 hr tablet Take 50 mg by mouth daily.     Historical Provider, MD  nutrition supplement, JUVEN, (JUVEN) PACK Take 1 packet by mouth 3 (three) times daily. Patient not taking: Reported on 11/13/2016 11/05/16   Kerney Elbe, DO  simvastatin (ZOCOR) 20 MG tablet Take 20 mg by mouth at bedtime.     Historical Provider, MD    Family History Family History  Problem Relation Age of Onset  . Cancer Mother     ?lung  . Cancer Maternal Grandfather     lung    Social History Social History  Substance Use Topics  . Smoking status: Former Smoker    Packs/day: 0.25    Years: 22.00    Types: Cigarettes    Quit date: 11/03/1992  . Smokeless tobacco: Never Used  . Alcohol use No     Allergies   Ivp dye [iodinated diagnostic agents]   Review of Systems Review of Systems  Musculoskeletal:       L hip pain  Skin: Positive for wound.       Chronic decubs on sacrum, feet   All other systems reviewed and are negative.    Physical Exam Updated Vital Signs BP (!) 133/103 (BP Location: Right Arm)   Pulse 112   Temp 97.8 F (36.6 C) (Oral)   Resp 17   SpO2 100%   Physical Exam  Constitutional: He is oriented to person, place, and time.  Chronically ill   HENT:  Head: Normocephalic.  Eyes: EOM are normal. Pupils are equal, round, and reactive to light.  Neck: Normal range of motion. Neck supple.  Cardiovascular: Normal heart sounds.   Tachycardic     Pulmonary/Chest: Effort normal and breath sounds normal. No respiratory distress.  Abdominal: Soft. Bowel sounds are normal.  Genitourinary:  Genitourinary Comments: Stage 3 ulcer sacrum. Stage 4 L heel with purulent drainage, stage 2 L heel. Diffuse cellulitis L leg. Mild diffuse tenderness L hip, L femur and tib/fib   Neurological: He is alert and oriented to person, place, and time.  Skin: There is erythema.  Psychiatric: He has a normal mood and affect.  Nursing note and vitals reviewed.    ED Treatments / Results  Labs (all labs ordered are listed, but only abnormal results are displayed) Labs Reviewed  CULTURE, BLOOD (ROUTINE X 2)  CULTURE, BLOOD (ROUTINE X 2)  URINE CULTURE  CBC WITH DIFFERENTIAL/PLATELET  COMPREHENSIVE METABOLIC PANEL  URINALYSIS, ROUTINE W REFLEX MICROSCOPIC  I-STAT CG4 LACTIC ACID, ED  I-STAT TROPOININ, ED    EKG  EKG Interpretation  Date/Time:  Sunday December 28 2016 10:56:21 EST Ventricular Rate:  114 PR Interval:    QRS Duration: 90 QT Interval:  325 QTC Calculation: 448 R Axis:   50 Text Interpretation:  Sinus tachycardia Left atrial enlargement Low voltage, precordial leads Abnormal R-wave progression, early transition Nonspecific T abnormalities, inferior leads Artifact in lead(s) I II III aVR aVL aVF V1 V2 V3 poor qualify data, grossly unchanged  Confirmed by YAO  MD, DAVID ([id number]) on 12/28/2016 11:06:11 AM       Radiology No results found.  Procedures Procedures (including critical care time)  Medications Ordered in ED Medications  sodium chloride 0.9 % bolus 2,000 mL (not administered)  fentaNYL (SUBLIMAZE) injection 50 mcg (not administered)     Initial Impression / Assessment and Plan / ED Course  I have reviewed the triage vital signs and the nursing notes.  Pertinent labs & imaging results that were available during my care of the patient were reviewed by me and considered in my medical decision making (see chart for  details).     Drago T Huizar is a 61 y.o. male here with fall, L hip pain. Has diffuse cellulitis L leg, has chronic ulcers that are draining. Given tachycardia, meets sepsis criteria. Also concerned about hip fracture given fall. Will do sepsis workup, xrays. Will likely give IV abx for cellulitis.   1:25 PM Lactate 3.7. WBC nl but Na 151 from dehydration. No abdominal tenderness. Lactic acidosis likely from cellulitis and decub ulcers. No obvious fractures or osteo on xrays. Tachycardic, borderline BP in the upper 90s. Meets sepsis criteria. Given vanc/zosyn. Hospitalist to admit.   Final Clinical Impressions(s) / ED Diagnoses   Final diagnoses:  Fall    New Prescriptions New Prescriptions   No medications on file     Drenda Freeze, MD 12/28/16 1326

## 2016-12-28 NOTE — ED Notes (Signed)
THis Rn stuck patient once, without success for blood draw or IV access.  Patient requesting to wait a little bit before he is stuck again. Made Manuela Schwartz RN aware that patient needs ultrasound IV access.

## 2016-12-28 NOTE — ED Notes (Signed)
Bed: WA02 Expected date:  Expected time:  Means of arrival:  Comments: 61 yo fall/lower back pain

## 2016-12-28 NOTE — ED Notes (Signed)
Patient has stage 3 pressure sore to right lower posterior leg. Patient has state 3 pressure sore to buttocks.  Both pressure sores are draining.

## 2016-12-29 DIAGNOSIS — L89150 Pressure ulcer of sacral region, unstageable: Secondary | ICD-10-CM

## 2016-12-29 DIAGNOSIS — Z8669 Personal history of other diseases of the nervous system and sense organs: Secondary | ICD-10-CM

## 2016-12-29 DIAGNOSIS — E785 Hyperlipidemia, unspecified: Secondary | ICD-10-CM

## 2016-12-29 LAB — BLOOD CULTURE ID PANEL (REFLEXED)
Acinetobacter baumannii: NOT DETECTED
Acinetobacter baumannii: NOT DETECTED
CANDIDA ALBICANS: NOT DETECTED
CANDIDA GLABRATA: NOT DETECTED
CANDIDA KRUSEI: NOT DETECTED
CANDIDA PARAPSILOSIS: NOT DETECTED
CANDIDA PARAPSILOSIS: NOT DETECTED
CANDIDA TROPICALIS: NOT DETECTED
CANDIDA TROPICALIS: NOT DETECTED
Candida albicans: NOT DETECTED
Candida glabrata: NOT DETECTED
Candida krusei: NOT DETECTED
ENTEROBACTER CLOACAE COMPLEX: NOT DETECTED
ENTEROBACTERIACEAE SPECIES: NOT DETECTED
ENTEROCOCCUS SPECIES: NOT DETECTED
ESCHERICHIA COLI: NOT DETECTED
Enterobacter cloacae complex: NOT DETECTED
Enterobacteriaceae species: NOT DETECTED
Enterococcus species: NOT DETECTED
Escherichia coli: NOT DETECTED
HAEMOPHILUS INFLUENZAE: NOT DETECTED
Haemophilus influenzae: NOT DETECTED
KLEBSIELLA OXYTOCA: NOT DETECTED
KLEBSIELLA PNEUMONIAE: NOT DETECTED
KLEBSIELLA PNEUMONIAE: NOT DETECTED
Klebsiella oxytoca: NOT DETECTED
LISTERIA MONOCYTOGENES: NOT DETECTED
Listeria monocytogenes: NOT DETECTED
NEISSERIA MENINGITIDIS: NOT DETECTED
Neisseria meningitidis: NOT DETECTED
PROTEUS SPECIES: NOT DETECTED
Proteus species: NOT DETECTED
Pseudomonas aeruginosa: NOT DETECTED
Pseudomonas aeruginosa: NOT DETECTED
SERRATIA MARCESCENS: NOT DETECTED
STAPHYLOCOCCUS AUREUS BCID: NOT DETECTED
STAPHYLOCOCCUS AUREUS BCID: NOT DETECTED
STAPHYLOCOCCUS SPECIES: NOT DETECTED
STREPTOCOCCUS AGALACTIAE: NOT DETECTED
STREPTOCOCCUS PNEUMONIAE: NOT DETECTED
STREPTOCOCCUS SPECIES: DETECTED — AB
Serratia marcescens: NOT DETECTED
Staphylococcus species: NOT DETECTED
Streptococcus agalactiae: NOT DETECTED
Streptococcus pneumoniae: NOT DETECTED
Streptococcus pyogenes: NOT DETECTED
Streptococcus pyogenes: NOT DETECTED
Streptococcus species: NOT DETECTED

## 2016-12-29 LAB — CBC
HCT: 33 % — ABNORMAL LOW (ref 39.0–52.0)
Hemoglobin: 10.1 g/dL — ABNORMAL LOW (ref 13.0–17.0)
MCH: 26.2 pg (ref 26.0–34.0)
MCHC: 30.6 g/dL (ref 30.0–36.0)
MCV: 85.5 fL (ref 78.0–100.0)
Platelets: 113 10*3/uL — ABNORMAL LOW (ref 150–400)
RBC: 3.86 MIL/uL — ABNORMAL LOW (ref 4.22–5.81)
RDW: 16.7 % — AB (ref 11.5–15.5)
WBC: 4.1 10*3/uL (ref 4.0–10.5)

## 2016-12-29 LAB — LACTIC ACID, PLASMA: Lactic Acid, Venous: 2.7 mmol/L (ref 0.5–1.9)

## 2016-12-29 LAB — BASIC METABOLIC PANEL
Anion gap: 5 (ref 5–15)
BUN: 22 mg/dL — AB (ref 6–20)
CALCIUM: 8.2 mg/dL — AB (ref 8.9–10.3)
CO2: 21 mmol/L — AB (ref 22–32)
Chloride: 122 mmol/L — ABNORMAL HIGH (ref 101–111)
Creatinine, Ser: 1.12 mg/dL (ref 0.61–1.24)
GFR calc Af Amer: >60 mL/min (ref >60)
GLUCOSE: 100 mg/dL — AB (ref 65–99)
Potassium: 3 mmol/L — ABNORMAL LOW (ref 3.5–5.1)
Sodium: 148 mmol/L — ABNORMAL HIGH (ref 135–145)

## 2016-12-29 MED ORDER — KCL IN DEXTROSE-NACL 40-5-0.45 MEQ/L-%-% IV SOLN
INTRAVENOUS | Status: DC
  Administered 2016-12-29 – 2017-01-01 (×6): via INTRAVENOUS
  Filled 2016-12-29 (×6): qty 1000

## 2016-12-29 MED ORDER — POTASSIUM CHLORIDE 2 MEQ/ML IV SOLN: INTRAVENOUS | Status: DC

## 2016-12-29 MED ORDER — POTASSIUM CHLORIDE CRYS ER 20 MEQ PO TBCR
40.0000 meq | EXTENDED_RELEASE_TABLET | Freq: Once | ORAL | Status: AC
  Administered 2016-12-29: 40 meq via ORAL
  Filled 2016-12-29: qty 2

## 2016-12-29 NOTE — Care Management Note (Signed)
Case Management Note  Patient Details  Name: Patrick Orozco MRN: [id number] Date of Birth: [birth year]  Subjective/Objective: 60 y/o m admitted w/Sepsis.Hx: sacral/leg/heel wounds. From home alone. Patient states he was using [facility] rep [name]-states patient was d/c since he wanted to use another agency-[facility]. I have left message w/[facility] rep-[name] to check if active or if referral was in place-await response.  PT-hydrotherapy, woc following. Await recc. CSW following also.                  Action/Plan:d/c plan likely SNF.   Expected Discharge Date:   (unknown)               Expected Discharge Plan:  [place]  In-House Referral:  Clinical Social Work  Discharge planning Services  CM Consult  Post Acute Care Choice:  Home Health (Patient unsure of [place] or [facility] home) Choice offered to:     DME Arranged:    DME Agency:     HH Arranged:    [place] Agency:     Status of Service:  In process, will continue to follow  If discussed at Long Length of Stay Meetings, dates discussed:    Additional Comments:  Dessa Phi, RN 12/29/2016, 12:10 PM

## 2016-12-29 NOTE — Progress Notes (Addendum)
PHARMACY - PHYSICIAN COMMUNICATION CRITICAL VALUE ALERT - BLOOD CULTURE IDENTIFICATION (BCID)  Results for orders placed or performed during the hospital encounter of 12/28/16  Blood Culture ID Panel (Reflexed) (Collected: 12/28/2016 11:45 AM)  Result Value Ref Range   Enterococcus species NOT DETECTED NOT DETECTED   Listeria monocytogenes NOT DETECTED NOT DETECTED   Staphylococcus species NOT DETECTED NOT DETECTED   Staphylococcus aureus NOT DETECTED NOT DETECTED   Streptococcus species DETECTED (A) NOT DETECTED   Streptococcus agalactiae NOT DETECTED NOT DETECTED   Streptococcus pneumoniae NOT DETECTED NOT DETECTED   Streptococcus pyogenes NOT DETECTED NOT DETECTED   Acinetobacter baumannii NOT DETECTED NOT DETECTED   Enterobacteriaceae species NOT DETECTED NOT DETECTED   Enterobacter cloacae complex NOT DETECTED NOT DETECTED   Escherichia coli NOT DETECTED NOT DETECTED   Klebsiella oxytoca NOT DETECTED NOT DETECTED   Klebsiella pneumoniae NOT DETECTED NOT DETECTED   Proteus species NOT DETECTED NOT DETECTED   Serratia marcescens NOT DETECTED NOT DETECTED   Haemophilus influenzae NOT DETECTED NOT DETECTED   Neisseria meningitidis NOT DETECTED NOT DETECTED   Pseudomonas aeruginosa NOT DETECTED NOT DETECTED   Candida albicans NOT DETECTED NOT DETECTED   Candida glabrata NOT DETECTED NOT DETECTED   Candida krusei NOT DETECTED NOT DETECTED   Candida parapsilosis NOT DETECTED NOT DETECTED   Candida tropicalis NOT DETECTED NOT DETECTED    Name of physician (or Provider) Contacted: [place]  Changes to prescribed antibiotics required: stop vancomycin, continue Zosyn  Nollie Terlizzi A 12/29/2016  9:48 AM   ADDENDUM:  PHARMACY - PHYSICIAN COMMUNICATION CRITICAL VALUE ALERT - BLOOD CULTURE IDENTIFICATION (BCID)  Results for orders placed or performed during the hospital encounter of 12/28/16  Blood Culture ID Panel (Reflexed) (Collected: 12/28/2016  1:00 PM)  Result Value Ref Range   Enterococcus species NOT DETECTED NOT DETECTED   Listeria monocytogenes NOT DETECTED NOT DETECTED   Staphylococcus species NOT DETECTED NOT DETECTED   Staphylococcus aureus NOT DETECTED NOT DETECTED   Streptococcus species NOT DETECTED NOT DETECTED   Streptococcus agalactiae NOT DETECTED NOT DETECTED   Streptococcus pneumoniae NOT DETECTED NOT DETECTED   Streptococcus pyogenes NOT DETECTED NOT DETECTED   Acinetobacter baumannii NOT DETECTED NOT DETECTED   Enterobacteriaceae species NOT DETECTED NOT DETECTED   Enterobacter cloacae complex NOT DETECTED NOT DETECTED   Escherichia coli NOT DETECTED NOT DETECTED   Klebsiella oxytoca NOT DETECTED NOT DETECTED   Klebsiella pneumoniae NOT DETECTED NOT DETECTED   Proteus species NOT DETECTED NOT DETECTED   Serratia marcescens NOT DETECTED NOT DETECTED   Haemophilus influenzae NOT DETECTED NOT DETECTED   Neisseria meningitidis NOT DETECTED NOT DETECTED   Pseudomonas aeruginosa NOT DETECTED NOT DETECTED   Candida albicans NOT DETECTED NOT DETECTED   Candida glabrata NOT DETECTED NOT DETECTED   Candida krusei NOT DETECTED NOT DETECTED   Candida parapsilosis NOT DETECTED NOT DETECTED   Candida tropicalis NOT DETECTED NOT DETECTED    Name of physician (or Provider) Contacted: [place]  Changes to prescribed antibiotics required: none, continue Zosyn  Orlanda Frankum A 12/29/2016  1:11 PM

## 2016-12-29 NOTE — Consult Note (Signed)
Reason for Consult: Decubitus ulcer Referring Physician: Dr. Reino Bellis  Chief complaint:   soreness on his left side.  Patrick Orozco is an 61 y.o. male.  HPI: Patient is 61 year old male who lives by himself who is brought to the ED on 12/28/16 after falling out of his wheelchair. He apparently stayed on the floor all night before calling 911. He denied any head injury or loss of consciousness with this fall. He been seen in the ED several days prior with weakness and was diagnosed with UTI. He complained of left hip pain that was hurting prior to his fall. Can't have stage III pressure sores right lower posterior leg and buttocks. Both sides were draining He has a history of spina bifida with paraplegia, neurogenic bladder with chronic condom cath he is wheelchair-bound. He has a colostomy from 1983 at [facility]. This is presumably a diverting colostomy.  Workup in the emergency department showed no acute fractures. He has a deformity of hip and knee. Labs were normal., NA 151 lactate was 3.7. He does have multiple decubitus wounds on exam with possible cellulitis. He was admitted for IV antibiotics fluid and further evaluation for these pressure sores. He's been seen by wound care: Lori McNichols.  I have copied her note below.  Reason for Consult:Numerous skin injuries, pressure, moisture (IAD from urine and ITD) Wound type: Pressure,  venous insufficiency,  Moisture, trauma (fall) Pressure Injury POA: Yes Measurements: Right medial knee:  Full thickness: 1.5cm x 2cm x 0.2cm with pink, moist wound bed. Scant amount serous exudate. Right lateral LE:  Two intact blood-filled blisters (DTPI), proximal is hard and measures 1.5cm x 2.5cm (Unstagebale), distal measures 1.5cm x 3cm. NO exudate. Right lateral foot (DTPI) measures 4.5cm x 1.5cm (no exudate). Right foot toes:  Scattered areas of dry, stable eschar (>5), the largest of which measures 1cm round. No exudate. Left lateral  LE 1.5cm x 2cm with depth obscured by the presence of yellow slough (Unstageable), no exudate Left medial heel (posterior) 2.5cm x 3cm Dark tissue with no fluctuance. DTPI Skin tear on upper medial left thigh measuring 3cm x 1cm x 0.2cm. Pink, moist. No exudate. MASD to scrotum, penis, medial thighs Moisture and maceration in the bilateral inguinal skin folds, also the sub pannicular skin folds Coccygeal Pressure Injury (Unstageable)  12cm x 18cm with depth obscured by the presence of necrotic firmly adherent, grey/white eschar. Left posterior thigh:  Full thickness Stage 3 measuring 7cm x 7cm x 0.1cm. Wound is red, moist. Wound bed: As described above. Drainage (amount, consistency, odor) Weeping from bilateral LEs previously reported has ceased with elevation. Others as described above. Periwound:Maceration   We are ask to see.  Past Medical History:  Diagnosis Date  . Colostomy in place [facility])   . Depression    wife passed July 2013  . GERD (gastroesophageal reflux disease)   . Hyperlipidemia   . Hypertension   . Kidney stones   . Kidney stones   . Neurogenic bladder    uses condom cath  . Sarcoidosis ([place])   . Shortness of breath    occassionally  . Sleep apnea    pt denies sleep apnea pt states never followed through with sleep study; pt scored 5 per stop bang tool per PAT visit 07/17/2015; results sent to PCP Dr Ayesha Rumpf  . Spina bifida   . UTI (lower urinary tract infection)     Past Surgical History:  Procedure Laterality Date  . COLOSTOMY    .  CYSTOSCOPY W/ URETERAL STENT PLACEMENT Left 06/15/2015   Procedure: CYSTOSCOPY WITH STENT REPLACEMENT;  Surgeon: Raynelle Bring, MD;  Location: [facility];  Service: Urology;  Laterality: Left;  . CYSTOSCOPY/URETEROSCOPY/HOLMIUM LASER/STENT PLACEMENT Left 07/18/2015   Procedure: 1ST STAGE CYSTOSCOPY/URETEROSCOPY/HOLMIUM LASER/STENT REPLACEMENT;  Surgeon: Alexis Frock, MD;  Location: [facility];  Service: Urology;  Laterality: Left;  .  CYSTOSCOPY/URETEROSCOPY/HOLMIUM LASER/STENT PLACEMENT Left 07/20/2015   Procedure: 2ND STAGE CYSTOSCOPY LEFT URETEROSCOPY STENT REPLACEMENT left retrograde;  Surgeon: Alexis Frock, MD;  Location: [facility];  Service: Urology;  Laterality: Left;  . ENDOBRONCHIAL ULTRASOUND Bilateral 04/25/2013   Procedure: ENDOBRONCHIAL ULTRASOUND;  Surgeon: Collene Gobble, MD;  Location: [facility];  Service: Cardiopulmonary;  Laterality: Bilateral;  . SPINE SURGERY     multiple surgeries related to spina bifida  . TOE AMPUTATION  2010   toe on left foot    Family History  Problem Relation Age of Onset  . Cancer Mother     ?lung  . Cancer Maternal Grandfather     lung    Social History:  reports that he quit smoking about 24 years ago. His smoking use included Cigarettes. He has a 5.50 pack-year smoking history. He has never used smokeless tobacco. He reports that he does not drink alcohol or use drugs.  Allergies:  Allergies  Allergen Reactions  . Ivp Dye [Iodinated Diagnostic Agents] Itching    Medications:  Prior to Admission:  Prescriptions Prior to Admission  Medication Sig Dispense Refill Last Dose  . metoprolol (TOPROL-XL) 50 MG 24 hr tablet Take 50 mg by mouth daily.    Past Week at Unknown time  . simvastatin (ZOCOR) 20 MG tablet Take 20 mg by mouth at bedtime.    Past Week at Unknown time   Scheduled: . collagenase   Topical Daily  . enoxaparin (LOVENOX) injection  40 mg Subcutaneous Q24H  . piperacillin-tazobactam (ZOSYN)  IV  3.375 g Intravenous Q8H  . potassium chloride  40 mEq Oral Once  . simvastatin  20 mg Oral QHS  . sodium chloride flush  3 mL Intravenous Q12H  . vancomycin  1,000 mg Intravenous Q12H   Continuous: . dextrose 5 % and 0.45 % NaCl with KCl 40 mEq/L     ZOX:[id number] **OR** acetaminophen, traMADol Anti-infectives    Start     Dose/Rate Route Frequency Ordered Stop   12/29/16 0100  vancomycin (VANCOCIN) IVPB 1000 mg/200 mL premix     1,000 mg 200  mL/hr over 60 Minutes Intravenous Every 12 hours 12/28/16 1242     12/28/16 2000  piperacillin-tazobactam (ZOSYN) IVPB 3.375 g     3.375 g 12.5 mL/hr over 240 Minutes Intravenous Every 8 hours 12/28/16 1243     12/28/16 1300  vancomycin (VANCOCIN) 1,500 mg in sodium chloride 0.9 % 500 mL IVPB     1,500 mg 250 mL/hr over 120 Minutes Intravenous  Once 12/28/16 1242 12/28/16 1541   12/28/16 1245  piperacillin-tazobactam (ZOSYN) IVPB 3.375 g     3.375 g 100 mL/hr over 30 Minutes Intravenous  Once 12/28/16 1243 12/28/16 1334      Results for orders placed or performed during the hospital encounter of 12/28/16 (from the past 48 hour(s))  Blood culture (routine x 2)     Status: None (Preliminary result)   Collection Time: 12/28/16 11:45 AM  Result Value Ref Range   Specimen Description BLOOD LEFT UPPER ARM  5 ML IN [facility] BOTTLE    Special Requests BOTTLES DRAWN AEROBIC AND ANAEROBIC 5CC  Culture  Setup Time      IN BOTH AEROBIC AND ANAEROBIC BOTTLES GRAM POSITIVE COCCI IN CHAINS Organism ID to follow CRITICAL RESULT CALLED TO, READ BACK BY AND VERIFIED WITH: TO EJACKSON(PHARMD) BY TCLEVELAND AT 12/29/16 AT 6:00AM Performed at [facility], [place] [address], [place], [place] [address]    Culture PENDING    Report Status PENDING   Blood Culture ID Panel (Reflexed)     Status: Abnormal   Collection Time: 12/28/16 11:45 AM  Result Value Ref Range   Enterococcus species NOT DETECTED NOT DETECTED   Listeria monocytogenes NOT DETECTED NOT DETECTED   Staphylococcus species NOT DETECTED NOT DETECTED   Staphylococcus aureus NOT DETECTED NOT DETECTED   Streptococcus species DETECTED (A) NOT DETECTED    Comment: Not Enterococcus species, Streptococcus agalactiae, Streptococcus pyogenes, or Streptococcus pneumoniae. CRITICAL RESULT CALLED TO, READ BACK BY AND VERIFIED WITH: TO EJACKSON(PHARMD) BY TCLEVELAND AT 12/29/2016 AT 6:00AM    Streptococcus agalactiae NOT DETECTED NOT DETECTED    Streptococcus pneumoniae NOT DETECTED NOT DETECTED   Streptococcus pyogenes NOT DETECTED NOT DETECTED   Acinetobacter baumannii NOT DETECTED NOT DETECTED   Enterobacteriaceae species NOT DETECTED NOT DETECTED   Enterobacter cloacae complex NOT DETECTED NOT DETECTED   Escherichia coli NOT DETECTED NOT DETECTED   Klebsiella oxytoca NOT DETECTED NOT DETECTED   Klebsiella pneumoniae NOT DETECTED NOT DETECTED   Proteus species NOT DETECTED NOT DETECTED   Serratia marcescens NOT DETECTED NOT DETECTED   Haemophilus influenzae NOT DETECTED NOT DETECTED   Neisseria meningitidis NOT DETECTED NOT DETECTED   Pseudomonas aeruginosa NOT DETECTED NOT DETECTED   Candida albicans NOT DETECTED NOT DETECTED   Candida glabrata NOT DETECTED NOT DETECTED   Candida krusei NOT DETECTED NOT DETECTED   Candida parapsilosis NOT DETECTED NOT DETECTED   Candida tropicalis NOT DETECTED NOT DETECTED    Comment: Performed at [facility], [place] [address], [place],  [address]  CBC with Differential/Platelet     Status: Abnormal   Collection Time: 12/28/16 11:47 AM  Result Value Ref Range   WBC 6.3 4.0 - 10.5 K/uL   RBC 4.43 4.22 - 5.81 MIL/uL   Hemoglobin 12.3 (L) 13.0 - 17.0 g/dL   HCT 38.3 (L) 39.0 - 52.0 %   MCV 86.5 78.0 - 100.0 fL   MCH 27.8 26.0 - 34.0 pg   MCHC 32.1 30.0 - 36.0 g/dL   RDW 16.7 (H) 11.5 - 15.5 %   Platelets 167 150 - 400 K/uL   Neutrophils Relative % 78 %   Neutro Abs 4.9 1.7 - 7.7 K/uL   Lymphocytes Relative 14 %   Lymphs Abs 0.9 0.7 - 4.0 K/uL   Monocytes Relative 7 %   Monocytes Absolute 0.4 0.1 - 1.0 K/uL   Eosinophils Relative 1 %   Eosinophils Absolute 0.0 0.0 - 0.7 K/uL   Basophils Relative 0 %   Basophils Absolute 0.0 0.0 - 0.1 K/uL  Comprehensive metabolic panel     Status: Abnormal   Collection Time: 12/28/16 11:47 AM  Result Value Ref Range   Sodium 151 (H) 135 - 145 mmol/L   Potassium 4.1 3.5 - 5.1 mmol/L   Chloride 119 (H) 101 - 111 mmol/L   CO2 22 22 -  32 mmol/L   Glucose, Bld 109 (H) 65 - 99 mg/dL   BUN 30 (H) 6 - 20 mg/dL   Creatinine, Ser 1.09 0.61 - 1.24 mg/dL   Calcium 9.2 8.9 - 10.3  mg/dL   Total Protein 7.9 6.5 - 8.1 g/dL   Albumin 2.4 (L) 3.5 - 5.0 g/dL   AST 30 15 - 41 U/L   ALT 20 17 - 63 U/L   Alkaline Phosphatase 61 38 - 126 U/L   Total Bilirubin 0.7 0.3 - 1.2 mg/dL   GFR calc non Af Amer >60 >60 mL/min   GFR calc Af Amer >60 >60 mL/min    Comment: (NOTE) The eGFR has been calculated using the CKD EPI equation. This calculation has not been validated in all clinical situations. eGFR's persistently <60 mL/min signify possible Chronic Kidney Disease.    Anion gap 10 5 - 15  I-stat troponin, ED     Status: None   Collection Time: 12/28/16 11:55 AM  Result Value Ref Range   Troponin i, poc 0.00 0.00 - 0.08 ng/mL   Comment 3            Comment: Due to the release kinetics of cTnI, a negative result within the first hours of the onset of symptoms does not rule out myocardial infarction with certainty. If myocardial infarction is still suspected, repeat the test at appropriate intervals.   I-Stat CG4 Lactic Acid, ED     Status: Abnormal   Collection Time: 12/28/16 11:57 AM  Result Value Ref Range   Lactic Acid, Venous 3.70 (HH) 0.5 - 1.9 mmol/L   Comment NOTIFIED PHYSICIAN   Urinalysis, Routine w reflex microscopic     Status: Abnormal   Collection Time: 12/28/16 12:29 PM  Result Value Ref Range   Color, Urine YELLOW YELLOW   APPearance HAZY (A) CLEAR   Specific Gravity, Urine 1.019 1.005 - 1.030   pH 5.0 5.0 - 8.0   Glucose, UA NEGATIVE NEGATIVE mg/dL   Hgb urine dipstick SMALL (A) NEGATIVE   Bilirubin Urine NEGATIVE NEGATIVE   Ketones, ur 5 (A) NEGATIVE mg/dL   Protein, ur NEGATIVE NEGATIVE mg/dL   Nitrite NEGATIVE NEGATIVE   Leukocytes, UA TRACE (A) NEGATIVE   RBC / HPF 0-5 0 - 5 RBC/hpf   WBC, UA 0-5 0 - 5 WBC/hpf   Bacteria, UA RARE (A) NONE SEEN   Squamous Epithelial / LPF 0-5 (A) NONE SEEN   Mucous  PRESENT    Hyaline Casts, UA PRESENT   Lactic acid, plasma     Status: Abnormal   Collection Time: 12/28/16  8:22 PM  Result Value Ref Range   Lactic Acid, Venous 2.8 (HH) 0.5 - 1.9 mmol/L    Comment: CRITICAL RESULT CALLED TO, READ BACK BY AND VERIFIED WITH: R.CORCORAN,RN 2127 12/28/16 W.SHEA   Procalcitonin     Status: None   Collection Time: 12/28/16  8:22 PM  Result Value Ref Range   Procalcitonin 1.89 ng/mL    Comment:        Interpretation: PCT > 0.5 ng/mL and <= 2 ng/mL: Systemic infection (sepsis) is possible, but other conditions are known to elevate PCT as well. (NOTE)         ICU PCT Algorithm               Non ICU PCT Algorithm    ----------------------------     ------------------------------         PCT < 0.25 ng/mL                 PCT < 0.1 ng/mL     Stopping of antibiotics            Stopping of antibiotics  strongly encouraged.               strongly encouraged.    ----------------------------     ------------------------------       PCT level decrease by               PCT < 0.25 ng/mL       >= 80% from peak PCT       OR PCT 0.25 - 0.5 ng/mL          Stopping of antibiotics                                             encouraged.     Stopping of antibiotics           encouraged.    ----------------------------     ------------------------------       PCT level decrease by              PCT >= 0.25 ng/mL       < 80% from peak PCT        AND PCT >= 0.5 ng/mL             Continuing antibiotics                                              encouraged.       Continuing antibiotics            encouraged.    ----------------------------     ------------------------------     PCT level increase compared          PCT > 0.5 ng/mL         with peak PCT AND          PCT >= 0.5 ng/mL             Escalation of antibiotics                                          strongly encouraged.      Escalation of antibiotics        strongly encouraged.   Protime-INR      Status: Abnormal   Collection Time: 12/28/16  8:22 PM  Result Value Ref Range   Prothrombin Time 18.3 (H) 11.4 - 15.2 seconds   INR 1.50   APTT     Status: None   Collection Time: 12/28/16  8:22 PM  Result Value Ref Range   aPTT 33 24 - 36 seconds  Lactic acid, plasma     Status: Abnormal   Collection Time: 12/29/16  6:29 AM  Result Value Ref Range   Lactic Acid, Venous 2.7 (HH) 0.5 - 1.9 mmol/L    Comment: CRITICAL RESULT CALLED TO, READ BACK BY AND VERIFIED WITH: HUFF,J. RN '@[id number]'  ON 2.26.18 BY [facility]   Basic metabolic panel     Status: Abnormal   Collection Time: 12/29/16  6:29 AM  Result Value Ref Range   Sodium 148 (H) 135 - 145 mmol/L   Potassium 3.0 (L) 3.5 - 5.1 mmol/L    Comment: DELTA CHECK NOTED REPEATED TO VERIFY    Chloride  122 (H) 101 - 111 mmol/L   CO2 21 (L) 22 - 32 mmol/L   Glucose, Bld 100 (H) 65 - 99 mg/dL   BUN 22 (H) 6 - 20 mg/dL   Creatinine, Ser 1.12 0.61 - 1.24 mg/dL   Calcium 8.2 (L) 8.9 - 10.3 mg/dL   GFR calc non Af Amer >60 >60 mL/min   GFR calc Af Amer >60 >60 mL/min    Comment: (NOTE) The eGFR has been calculated using the CKD EPI equation. This calculation has not been validated in all clinical situations. eGFR's persistently <60 mL/min signify possible Chronic Kidney Disease.    Anion gap 5 5 - 15  CBC     Status: Abnormal   Collection Time: 12/29/16  6:29 AM  Result Value Ref Range   WBC 4.1 4.0 - 10.5 K/uL   RBC 3.86 (L) 4.22 - 5.81 MIL/uL   Hemoglobin 10.1 (L) 13.0 - 17.0 g/dL   HCT 33.0 (L) 39.0 - 52.0 %   MCV 85.5 78.0 - 100.0 fL   MCH 26.2 26.0 - 34.0 pg   MCHC 30.6 30.0 - 36.0 g/dL   RDW 16.7 (H) 11.5 - 15.5 %   Platelets 113 (L) 150 - 400 K/uL    Comment: REPEATED TO VERIFY SPECIMEN CHECKED FOR CLOTS PLATELET COUNT CONFIRMED BY SMEAR     Dg Chest 1 View  Result Date: 12/28/2016 CLINICAL DATA:  Fall with pain.  Paraplegia.  Initial encounter. EXAM: CHEST 1 VIEW COMPARISON:  12/16/2016 and prior chest radiographs  FINDINGS: The cardiomediastinal silhouette is unremarkable. There is no evidence of focal airspace disease, pulmonary edema, suspicious pulmonary nodule/mass, pleural effusion, or pneumothorax. No acute bony abnormalities are identified. IMPRESSION: No active disease. Electronically Signed   By: Margarette Canada M.D.   On: 12/28/2016 13:16   Dg Pelvis 1-2 Views  Result Date: 12/28/2016 CLINICAL DATA:  Fall and injury to pelvis.  Initial encounter. EXAM: PELVIS - 1-2 VIEW COMPARISON:  06/15/2015 CT and prior studies FINDINGS: No acute fracture identified. Deformity of both hips again noted. No suspicious focal bony lesions identified. IMPRESSION: No acute abnormality. Electronically Signed   By: Margarette Canada M.D.   On: 12/28/2016 13:18   Dg Tibia/fibula Left  Result Date: 12/28/2016 CLINICAL DATA:  Fall with left lower leg injury.  Initial encounter. EXAM: LEFT TIBIA AND FIBULA - 2 VIEW COMPARISON:  05/30/2016 radiographs FINDINGS: No acute fracture. Chronic deformity within the ankle and foot noted. No acute abnormalities are identified. IMPRESSION: No acute abnormalities. Electronically Signed   By: Margarette Canada M.D.   On: 12/28/2016 13:21   Dg Foot Complete Left  Result Date: 12/28/2016 CLINICAL DATA:  Fall with left foot injury.  Initial encounter. EXAM: LEFT FOOT - COMPLETE 3+ VIEW COMPARISON:  05/30/2016 FINDINGS: An equivocal nondisplaced fracture of the distal metatarsal on the lateral view noted. Soft tissue swelling is present. Chronic deformity within the ankle and foot again noted. IMPRESSION: Equivocal nondisplaced fracture of the distal metatarsal on the lateral view. Electronically Signed   By: Margarette Canada M.D.   On: 12/28/2016 13:24   Dg Femur Min 2 Views Left  Result Date: 12/28/2016 CLINICAL DATA:  Fall with left leg injury.  Initial encounter. EXAM: LEFT FEMUR 2 VIEWS COMPARISON:  None. FINDINGS: No acute fracture or dislocation identified. Chronic deformity in the hip and knee noted. No  suspicious focal bony lesions identified. IMPRESSION: No acute abnormality. Electronically Signed   By: Margarette Canada M.D.   On:  12/28/2016 13:20    Review of Systems  Constitutional: Negative.   HENT: Negative.   Eyes: Negative.   Respiratory: Negative.   Cardiovascular: Negative.   Gastrointestinal: Negative.   Genitourinary: Negative.   Musculoskeletal: Negative.   Skin:       He reports she's had multiple ulcers in the past.  Neurological: Negative.   Endo/Heme/Allergies: Negative.   Psychiatric/Behavioral: Negative.    Blood pressure 120/63, pulse 89, temperature 98 F (36.7 C), temperature source Axillary, resp. rate 18, SpO2 100 %. Physical Exam  Constitutional: He is oriented to person, place, and time.  Chronically ill obese male confined to the bed and wheelchair.  HENT:  Head: Normocephalic and atraumatic.  Mouth/Throat: No oropharyngeal exudate.  Eyes: Right eye exhibits no discharge. Left eye exhibits no discharge. No scleral icterus.  Neck: Normal range of motion. Neck supple. No JVD present. No tracheal deviation present. No thyromegaly present.  Cardiovascular: Normal rate, regular rhythm, normal heart sounds and intact distal pulses.   No murmur heard. Respiratory: Effort normal and breath sounds normal. No respiratory distress. He has no wheezes. He has no rales. He exhibits no tenderness.  GI: Soft. Bowel sounds are normal. He exhibits no distension and no mass. There is no tenderness. There is no rebound and no guarding.  Morbidly obese with a left lower quadrant colostomy.  Genitourinary:  Genitourinary Comments: Is a condom catheter in place. His scrotum is macerated.  Musculoskeletal: He exhibits edema and tenderness (Ulcers and blisters with dressings applied appropriately.).  Lymphadenopathy:    He has no cervical adenopathy.  Neurological: He is alert and oriented to person, place, and time.  Skin: No rash noted. No erythema. No pallor.  Pictures below.  He has multiple sites of skin breakdown aside from this current eschar on his coccyx. It measures 12 x 8 cm. It is malodorous. He has some drainage but it has markedly decreased since he was admitted and started on wound care and antibiotics. Wound care is appropriately addressing majority of the other wounds.  Psychiatric: He has a normal mood and affect. His behavior is normal. Judgment and thought content normal.      12 x 8 CM   . dextrose 5 % and 0.45 % NaCl with KCl 40 mEq/L 75 mL/hr at 12/29/16 1031   Assessment/Plan: 1.  Coccygeal Pressure Injury (Unstageable)  12cm x 18cm with depth obscured by the presence of necrotic firmly adherent, grey/white eschar. 2.  Fall on floor for unknown amount of time 3.  Spina Bifida with paraplegia 4. Confined to bed and wheelchair 5.  Colostomy - 1983. [facility] 6.  Obesity 7.  Hypertension 8.  Neurogenic bladder 9.  Hx of sarcoidosis 10.  Sleep apnea FEN:  IV fluids ID:  Zosyn/Vancomycin, 12/28/16  =>> day 2 DVT:  Lovenox    Plan:  I will review with Dr. Excell Seltzer.  He had a fair amount of drainage coming around the eschar pictured above. That has improved. It is malodorous when you take the dressing down initially. I think this may well require surgical debridement in the OR. This will be followed by extensive wound care.Marland Kitchen He currently lives alone.    Patrick Orozco 12/29/2016, 8:48 AM

## 2016-12-29 NOTE — Progress Notes (Addendum)
HYDROTHERAPY EVALUATION     12/29/16 1500  Subjective Assessment  Subjective I just want these wounds to heal  Patient and Family Stated Goals for wounds to heal  Date of Onset 12/28/16  Evaluation and Treatment  Evaluation and Treatment Procedures Explained to Patient/Family Yes  Evaluation and Treatment Procedures agreed to  Pressure Injury 12/28/16 Unstageable - Full thickness tissue loss in which the base of the ulcer is covered by slough (yellow, tan, gray, green or brown) and/or eschar (tan, brown or black) in the wound bed. large wound to coccyx that reaches to BIL isch  Date First Assessed/Time First Assessed: 12/28/16 1959   Location: Coccyx  Staging: Unstageable - Full thickness tissue loss in which the base of the ulcer is covered by slough (yellow, tan, gray, green or brown) and/or eschar (tan, brown or black) in...  Dressing Type Moist to moist;ABD (Santyl)  Dressing Changed;Clean  Dressing Change Frequency Twice a day  State of Healing Eschar  Site / Wound Assessment Brown;Yellow;Black  % Wound base Black/Eschar 100%  Peri-wound Assessment Excoriated;Bleeding;Pink;Maceration  Wound Length (cm) 12 cm  Wound Width (cm) 13 cm  Wound Depth (cm) (unknown-obscured by eschar)  Drainage Amount Scant  Treatment Hydrotherapy (Pulse lavage);Debridement (Selective)  Hydrotherapy  Pulsed lavage therapy - wound location coccyx  Pulsed Lavage with Suction (psi) 8 psi  Pulsed Lavage with Suction - Normal Saline Used 1000 mL  Pulsed Lavage Tip Tip with splash shield  Wound Therapy - Assess/Plan/Recommendations  Wound Therapy - Clinical Statement 61 yo male with history of spina bifida, chronic bil LE paralysis, colostomy, COPD, pressure ulcers admitted with sepsis and multiple wounds after falling while transferring to his wheelchair. PT Hydrotherapy ordered to help manage coccygeal wound.   Wound Therapy - Functional Problem List Limited mobility-WC bound, multiple falls, bil LE  paralysis 2* spina bifida  Factors Delaying/Impairing Wound Healing Immobility;Incontinence;Altered sensation  Hydrotherapy Plan Debridement;Dressing change;Pulsatile lavage with suction  Wound Therapy - Frequency 6X / week  Wound Therapy - Current Recommendations Surgery consult;Case manager/social work  Wound Therapy - Follow Up Recommendations Skilled nursing facility  Wound Plan PT Hydrotherapy for pulsed lavage and selective debridement to help manage coccygeal wound and encourage wound healing  Wound Therapy Goals - Improve the function of patient's integumentary system by progressing the wound(s) through the phases of wound healing by:  Decrease Necrotic Tissue to 50%  Increase Granulation Tissue to 50%  Goals/treatment plan/discharge plan were made with and agreed upon by patient/family Yes  Time For Goal Achievement 2 weeks  Wound Therapy - Potential for Goals Fair

## 2016-12-29 NOTE — Progress Notes (Signed)
[facility] PROGRESS NOTE  Patrick Orozco HRC:[id number] DOB: 1956/08/25 DOA: 12/28/2016 PCP: Kristine Garbe, MD Interim summary and HPI 61 y.o. male with medical history significant of spina bifida and paraplegia who lives at home by himself and is wheelchair-bound presented to the emergency room after a fall today where he got out of bed and missed his wheelchair. He lhas had multiple images done in ED and no acute fracture appreciated. He was found with multiples pressure injuries and a big one in his coccyx area that suggested superimposed infection. Patient without fever, but with elevated lactic acid, bandemia (despite normal WBC's count), tachycardia and hypernatremia.   Assessment/Plan: 1-Sepsis: due to superimposed infection on decubitus ulcer  -afebrile -improved tachycardia with IVF's -MRSA neg -will continue IV antibiotics, but narrowed to Zosyn  -will continue wound care and hydrotherapy -follow cx's results, continue supportive care and follow Lactic acid level  2-hypernatremia -will continue D51/2 NS -sodium trending down slowly -patient advise to keep himself well hydrated   3-HTN -BP stable for now -will monitor off antihypertensive in setting of sepsis  4-OSA -continue CPAP  5-unstageable multiple pressure injury  -will follow wound care rec's -air mattress and prevalon boots  6-HLD -continue statins  7-paraplegia  -will provide supportive care -patient will benefit of SNF at discharge  Code Status: Full Family Communication: no family at bedside  Disposition Plan: remains inpatient; will continue IV antibiotics; follow CX's; continue IVF's, continue wound care and per instructions will follow hydrotherapy by PT. Surgery on stand by as not acute need for debridement required.   Consultants:  WOC  CCS  Procedures:  See below for x-ray reports   Antibiotics:  Vancomycin 12/28/16>>>>12/29/16  Zosyn 12/28/16  HPI/Subjective: Afebrile,  obese, chronically ill in appearance man; in no acute distress and confined to bed. Patient chronically paraplegic.   Objective: Vitals:   12/29/16 0552 12/29/16 1243  BP: 120/63 125/64  Pulse: 89 86  Resp: 18 18  Temp: 98 F (36.7 C) 98.2 F (36.8 C)    Intake/Output Summary (Last 24 hours) at 12/29/16 1842 Last data filed at 12/29/16 1700  Gross per 24 hour  Intake           761.25 ml  Output             3700 ml  Net         -2938.75 ml   There were no vitals filed for this visit.  Exam:   General:  Afebrile, denies CP and SOB. Patient in no acute distress. AAOX3  Cardiovascular: S1 and S2, no rubs, no gallops  Respiratory: good air movement, no wheezing, no crackles   Abdomen: obese, soft, NT, ND, positive BS  Musculoskeletal: some atrophy appreciated; patient is paraplegic (chronically).  Skin: multiple sites of skin breakdown on his legs and different stages pressure injuries on Heels; also with some blisters. Big unstageable coccyx pressure injury, that measures 12 x 8 cm. It is malodorous; with eschar and some serosanguineous drainage      Data Reviewed: Basic Metabolic Panel:  Recent Labs Lab 12/28/16 1147 12/29/16 0629  NA 151* 148*  K 4.1 3.0*  CL 119* 122*  CO2 22 21*  GLUCOSE 109* 100*  BUN 30* 22*  CREATININE 1.09 1.12  CALCIUM 9.2 8.2*   Liver Function Tests:  Recent Labs Lab 12/28/16 1147  AST 30  ALT 20  ALKPHOS 61  BILITOT 0.7  PROT 7.9  ALBUMIN 2.4*   CBC:  Recent Labs Lab  12/28/16 1147 12/29/16 0629  WBC 6.3 4.1  NEUTROABS 4.9  --   HGB 12.3* 10.1*  HCT 38.3* 33.0*  MCV 86.5 85.5  PLT 167 113*   CBG: No results for input(s): GLUCAP in the last 168 hours.  Recent Results (from the past 240 hour(s))  Blood culture (routine x 2)     Status: None (Preliminary result)   Collection Time: 12/28/16 11:45 AM  Result Value Ref Range Status   Specimen Description BLOOD LEFT UPPER ARM  5 ML IN [facility] BOTTLE  Final   Special  Requests BOTTLES DRAWN AEROBIC AND ANAEROBIC 5CC  Final   Culture  Setup Time   Final    IN BOTH AEROBIC AND ANAEROBIC BOTTLES GRAM POSITIVE COCCI IN CHAINS CRITICAL RESULT CALLED TO, READ BACK BY AND VERIFIED WITH: TO EJACKSON(PHARMD) BY TCLEVELAND AT 12/29/16 AT 6:00AM Performed at [facility], [place] [address] [place], [place] [address]    Culture GRAM POSITIVE COCCI  Final   Report Status PENDING  Incomplete  Blood Culture ID Panel (Reflexed)     Status: Abnormal   Collection Time: 12/28/16 11:45 AM  Result Value Ref Range Status   Enterococcus species NOT DETECTED NOT DETECTED Final   Listeria monocytogenes NOT DETECTED NOT DETECTED Final   Staphylococcus species NOT DETECTED NOT DETECTED Final   Staphylococcus aureus NOT DETECTED NOT DETECTED Final   Streptococcus species DETECTED (A) NOT DETECTED Final    Comment: Not Enterococcus species, Streptococcus agalactiae, Streptococcus pyogenes, or Streptococcus pneumoniae. CRITICAL RESULT CALLED TO, READ BACK BY AND VERIFIED WITH: TO EJACKSON(PHARMD) BY TCLEVELAND AT 12/29/2016 AT 6:00AM    Streptococcus agalactiae NOT DETECTED NOT DETECTED Final   Streptococcus pneumoniae NOT DETECTED NOT DETECTED Final   Streptococcus pyogenes NOT DETECTED NOT DETECTED Final   Acinetobacter baumannii NOT DETECTED NOT DETECTED Final   Enterobacteriaceae species NOT DETECTED NOT DETECTED Final   Enterobacter cloacae complex NOT DETECTED NOT DETECTED Final   Escherichia coli NOT DETECTED NOT DETECTED Final   Klebsiella oxytoca NOT DETECTED NOT DETECTED Final   Klebsiella pneumoniae NOT DETECTED NOT DETECTED Final   Proteus species NOT DETECTED NOT DETECTED Final   Serratia marcescens NOT DETECTED NOT DETECTED Final   Haemophilus influenzae NOT DETECTED NOT DETECTED Final   Neisseria meningitidis NOT DETECTED NOT DETECTED Final   Pseudomonas aeruginosa NOT DETECTED NOT DETECTED Final   Candida albicans NOT DETECTED NOT DETECTED Final   Candida  glabrata NOT DETECTED NOT DETECTED Final   Candida krusei NOT DETECTED NOT DETECTED Final   Candida parapsilosis NOT DETECTED NOT DETECTED Final   Candida tropicalis NOT DETECTED NOT DETECTED Final    Comment: Performed at [facility], [place] [address], [place], [place] [address]  Urine culture     Status: None (Preliminary result)   Collection Time: 12/28/16 12:29 PM  Result Value Ref Range Status   Specimen Description URINE, CLEAN CATCH  Final   Special Requests NONE  Final   Culture   Final    CULTURE REINCUBATED FOR BETTER GROWTH Performed at [facility], [place] [address] [place], [place] [address]    Report Status PENDING  Incomplete  Blood culture (routine x 2)     Status: None (Preliminary result)   Collection Time: 12/28/16  1:00 PM  Result Value Ref Range Status   Specimen Description BLOOD RIGHT HAND  Final   Special Requests IN PEDIATRIC BOTTLE [facility]  Final   Culture  Setup Time  Final    GRAM NEGATIVE RODS IN PEDIATRIC BOTTLE CRITICAL RESULT CALLED TO, READ BACK BY AND VERIFIED WITH: T GREEN,PHARMD AT 1040 12/29/16 BY L BENFIELD Performed at [facility], [place] [address], [place], [place] [address]    Culture GRAM NEGATIVE RODS  Final   Report Status PENDING  Incomplete  Blood Culture ID Panel (Reflexed)     Status: None   Collection Time: 12/28/16  1:00 PM  Result Value Ref Range Status   Enterococcus species NOT DETECTED NOT DETECTED Final   Listeria monocytogenes NOT DETECTED NOT DETECTED Final   Staphylococcus species NOT DETECTED NOT DETECTED Final   Staphylococcus aureus NOT DETECTED NOT DETECTED Final   Streptococcus species NOT DETECTED NOT DETECTED Final   Streptococcus agalactiae NOT DETECTED NOT DETECTED Final   Streptococcus pneumoniae NOT DETECTED NOT DETECTED Final   Streptococcus pyogenes NOT DETECTED NOT DETECTED Final   Acinetobacter baumannii NOT DETECTED NOT DETECTED Final   Enterobacteriaceae species NOT DETECTED NOT DETECTED  Final   Enterobacter cloacae complex NOT DETECTED NOT DETECTED Final   Escherichia coli NOT DETECTED NOT DETECTED Final   Klebsiella oxytoca NOT DETECTED NOT DETECTED Final   Klebsiella pneumoniae NOT DETECTED NOT DETECTED Final   Proteus species NOT DETECTED NOT DETECTED Final   Serratia marcescens NOT DETECTED NOT DETECTED Final   Haemophilus influenzae NOT DETECTED NOT DETECTED Final   Neisseria meningitidis NOT DETECTED NOT DETECTED Final   Pseudomonas aeruginosa NOT DETECTED NOT DETECTED Final   Candida albicans NOT DETECTED NOT DETECTED Final   Candida glabrata NOT DETECTED NOT DETECTED Final   Candida krusei NOT DETECTED NOT DETECTED Final   Candida parapsilosis NOT DETECTED NOT DETECTED Final   Candida tropicalis NOT DETECTED NOT DETECTED Final    Comment: Performed at [facility], [place]. [address], [place], [place] [address]     Studies: Dg Chest 1 View  Result Date: 12/28/2016 CLINICAL DATA:  Fall with pain.  Paraplegia.  Initial encounter. EXAM: CHEST 1 VIEW COMPARISON:  12/16/2016 and prior chest radiographs FINDINGS: The cardiomediastinal silhouette is unremarkable. There is no evidence of focal airspace disease, pulmonary edema, suspicious pulmonary nodule/mass, pleural effusion, or pneumothorax. No acute bony abnormalities are identified. IMPRESSION: No active disease. Electronically Signed   By: Margarette Canada M.D.   On: 12/28/2016 13:16   Dg Pelvis 1-2 Views  Result Date: 12/28/2016 CLINICAL DATA:  Fall and injury to pelvis.  Initial encounter. EXAM: PELVIS - 1-2 VIEW COMPARISON:  06/15/2015 CT and prior studies FINDINGS: No acute fracture identified. Deformity of both hips again noted. No suspicious focal bony lesions identified. IMPRESSION: No acute abnormality. Electronically Signed   By: Margarette Canada M.D.   On: 12/28/2016 13:18   Dg Tibia/fibula Left  Result Date: 12/28/2016 CLINICAL DATA:  Fall with left lower leg injury.  Initial encounter. EXAM: LEFT TIBIA AND  FIBULA - 2 VIEW COMPARISON:  05/30/2016 radiographs FINDINGS: No acute fracture. Chronic deformity within the ankle and foot noted. No acute abnormalities are identified. IMPRESSION: No acute abnormalities. Electronically Signed   By: Margarette Canada M.D.   On: 12/28/2016 13:21   Dg Foot Complete Left  Result Date: 12/28/2016 CLINICAL DATA:  Fall with left foot injury.  Initial encounter. EXAM: LEFT FOOT - COMPLETE 3+ VIEW COMPARISON:  05/30/2016 FINDINGS: An equivocal nondisplaced fracture of the distal metatarsal on the lateral view noted. Soft tissue swelling is present. Chronic deformity within the ankle and foot again noted. IMPRESSION: Equivocal nondisplaced fracture  of the distal metatarsal on the lateral view. Electronically Signed   By: Margarette Canada M.D.   On: 12/28/2016 13:24   Dg Femur Min 2 Views Left  Result Date: 12/28/2016 CLINICAL DATA:  Fall with left leg injury.  Initial encounter. EXAM: LEFT FEMUR 2 VIEWS COMPARISON:  None. FINDINGS: No acute fracture or dislocation identified. Chronic deformity in the hip and knee noted. No suspicious focal bony lesions identified. IMPRESSION: No acute abnormality. Electronically Signed   By: Margarette Canada M.D.   On: 12/28/2016 13:20    Scheduled Meds: . collagenase   Topical Daily  . enoxaparin (LOVENOX) injection  40 mg Subcutaneous Q24H  . piperacillin-tazobactam (ZOSYN)  IV  3.375 g Intravenous Q8H  . simvastatin  20 mg Oral QHS  . sodium chloride flush  3 mL Intravenous Q12H   Continuous Infusions: . dextrose 5 % and 0.45 % NaCl with KCl 40 mEq/L 75 mL/hr at 12/29/16 1031    Principal Problem:   Sepsis ([place]) Active Problems:   Hypertension   History of spina bifida   H/O paraplegia   OSA (obstructive sleep apnea)   Pressure ulcer   Neurogenic bladder   Fall   Obesity (BMI 30-39.9)   Hypernatremia   Hyperlipidemia    Time spent: 25 minutes    Barton Dubois  [facility] Pager [phone]. If 7PM-7AM, please contact  night-coverage at [email or web address], password [facility] 12/29/2016, 6:42 PM  LOS: 1 day

## 2016-12-30 DIAGNOSIS — E872 Acidosis: Secondary | ICD-10-CM

## 2016-12-30 LAB — BASIC METABOLIC PANEL
ANION GAP: 3 — AB (ref 5–15)
BUN: 12 mg/dL (ref 6–20)
CO2: 22 mmol/L (ref 22–32)
Calcium: 8 mg/dL — ABNORMAL LOW (ref 8.9–10.3)
Chloride: 120 mmol/L — ABNORMAL HIGH (ref 101–111)
Creatinine, Ser: 0.98 mg/dL (ref 0.61–1.24)
GFR calc Af Amer: >60 mL/min (ref >60)
GFR calc non Af Amer: >60 mL/min (ref >60)
GLUCOSE: 93 mg/dL (ref 65–99)
POTASSIUM: 3.9 mmol/L (ref 3.5–5.1)
Sodium: 145 mmol/L (ref 135–145)

## 2016-12-30 LAB — HIV ANTIBODY (ROUTINE TESTING W REFLEX): HIV SCREEN 4TH GENERATION: NONREACTIVE

## 2016-12-30 LAB — LACTIC ACID, PLASMA: Lactic Acid, Venous: 2.7 mmol/L (ref 0.5–1.9)

## 2016-12-30 MED ORDER — PANTOPRAZOLE SODIUM 40 MG PO TBEC
40.0000 mg | DELAYED_RELEASE_TABLET | Freq: Two times a day (BID) | ORAL | Status: DC
  Administered 2016-12-30 – 2017-01-01 (×5): 40 mg via ORAL
  Filled 2016-12-30 (×5): qty 1

## 2016-12-30 NOTE — Progress Notes (Addendum)
Patient states he is having 'either chest pain or indigestion'.  EKG done, Vitals are stable, placed on 02 2L via [place] for comfort. Dr. Dyann Kief aware; he has ordered protonix PO.  Will administer first dose now and continue to monitor. Patient is in NAD and denies SOB and any other symptoms except a burning sensation. Patient's lactic acid also 2.7, Dr. Dyann Kief notified.

## 2016-12-30 NOTE — Progress Notes (Signed)
[facility] PROGRESS NOTE  Patrick Orozco XIP:[id number] DOB: 1956-04-09 DOA: 12/28/2016 PCP: Kristine Garbe, MD   Interim summary and HPI 61 y.o. male with medical history significant of spina bifida and paraplegia who lives at home by himself and is wheelchair-bound presented to the emergency room after a fall today where he got out of bed and missed his wheelchair. He lhas had multiple images done in ED and no acute fracture appreciated. He was found with multiples pressure injuries and a big one in his coccyx area that suggested superimposed infection. Patient without fever, but with elevated lactic acid, bandemia (despite normal WBC's count), tachycardia and hypernatremia.   Assessment/Plan: 1-Sepsis: due to superimposed infection on decubitus ulcer  -afebrile -improved tachycardia with IVF's -MRSA neg -will continue IV antibiotics, but narrowed to Zosyn  -will continue wound care, preventive measurements and hydrotherapy -follow cx's results/sensitivity, continue supportive care and follow Lactic acid level  2-hypernatremia -will continue D51/2 NS -sodium trending down slowly and properly -patient advise to keep himself well hydrated  -Na 145 today 2/27  3-HTN -BP stable for now -will monitor off antihypertensive in setting of sepsis  4-OSA -continue CPAP  5-unstageable multiple pressure injury  -will follow wound care rec's -air mattress and prevalon boots  6-HLD -continue statins  7-paraplegia  -will provide supportive care -patient will benefit of SNF at discharge  -SW involved and patient in agreement   8-GERD -will start PPI  Code Status: Full Family Communication: no family at bedside  Disposition Plan: remains inpatient; will continue IV antibiotics; follow CX's; continue IVF's, continue wound care and per instructions from [facility] and [place] will follow hydrotherapy by PT. Surgery on stand by as not acute need for debridement  required.   Consultants:  WOC  CCS  Procedures:  See below for x-ray reports   Antibiotics:  Vancomycin 12/28/16>>>>12/29/16  Zosyn 12/28/16  HPI/Subjective: Afebrile, obese, chronically ill in appearance man; in no acute distress. Denies SOB and palpitations. Patient chronically paraplegic. Endorses some GERD.  Objective: Vitals:   12/30/16 0457 12/30/16 1226  BP: 102/60 104/78  Pulse: 85 66  Resp: 16 18  Temp: 98.4 F (36.9 C) 97.4 F (36.3 C)    Intake/Output Summary (Last 24 hours) at 12/30/16 1231 Last data filed at 12/30/16 1121  Gross per 24 hour  Intake          1611.25 ml  Output             6850 ml  Net         -5238.75 ml   There were no vitals filed for this visit.  Exam:   General:  Afebrile, denies CP and SOB. Patient in no acute distress. AAOX3  Cardiovascular: S1 and S2, no rubs, no gallops  Respiratory: good air movement, no wheezing, no crackles   Abdomen: obese, soft, NT, ND, positive BS  Musculoskeletal: some atrophy appreciated; patient is paraplegic (chronically).  Skin: multiple sites of skin breakdown on his legs and different stages pressure injuries on Heels; also with some blisters. Big unstageable coccyx pressure injury, that measures 12 x 8 cm. It is malodorous; with eschar and some serosanguineous drainage    Data Reviewed: Basic Metabolic Panel:  Recent Labs Lab 12/28/16 1147 12/29/16 0629 12/30/16 1051  NA 151* 148* 145  K 4.1 3.0* 3.9  CL 119* 122* 120*  CO2 22 21* 22  GLUCOSE 109* 100* 93  BUN 30* 22* 12  CREATININE 1.09 1.12 0.98  CALCIUM 9.2 8.2* 8.0*  Liver Function Tests:  Recent Labs Lab 12/28/16 1147  AST 30  ALT 20  ALKPHOS 61  BILITOT 0.7  PROT 7.9  ALBUMIN 2.4*   CBC:  Recent Labs Lab 12/28/16 1147 12/29/16 0629  WBC 6.3 4.1  NEUTROABS 4.9  --   HGB 12.3* 10.1*  HCT 38.3* 33.0*  MCV 86.5 85.5  PLT 167 113*   CBG: No results for input(s): GLUCAP in the last 168 hours.  Recent  Results (from the past 240 hour(s))  Blood culture (routine x 2)     Status: None (Preliminary result)   Collection Time: 12/28/16 11:45 AM  Result Value Ref Range Status   Specimen Description BLOOD LEFT UPPER ARM  5 ML IN [facility] BOTTLE  Final   Special Requests BOTTLES DRAWN AEROBIC AND ANAEROBIC 5CC  Final   Culture  Setup Time   Final    IN BOTH AEROBIC AND ANAEROBIC BOTTLES GRAM POSITIVE COCCI IN CHAINS CRITICAL RESULT CALLED TO, READ BACK BY AND VERIFIED WITH: TO EJACKSON(PHARMD) BY TCLEVELAND AT 12/29/16 AT 6:00AM Performed at [facility], [place] [address], [place], [place] [address]    Culture GRAM POSITIVE COCCI  Final   Report Status PENDING  Incomplete  Blood Culture ID Panel (Reflexed)     Status: Abnormal   Collection Time: 12/28/16 11:45 AM  Result Value Ref Range Status   Enterococcus species NOT DETECTED NOT DETECTED Final   Listeria monocytogenes NOT DETECTED NOT DETECTED Final   Staphylococcus species NOT DETECTED NOT DETECTED Final   Staphylococcus aureus NOT DETECTED NOT DETECTED Final   Streptococcus species DETECTED (A) NOT DETECTED Final    Comment: Not Enterococcus species, Streptococcus agalactiae, Streptococcus pyogenes, or Streptococcus pneumoniae. CRITICAL RESULT CALLED TO, READ BACK BY AND VERIFIED WITH: TO EJACKSON(PHARMD) BY TCLEVELAND AT 12/29/2016 AT 6:00AM    Streptococcus agalactiae NOT DETECTED NOT DETECTED Final   Streptococcus pneumoniae NOT DETECTED NOT DETECTED Final   Streptococcus pyogenes NOT DETECTED NOT DETECTED Final   Acinetobacter baumannii NOT DETECTED NOT DETECTED Final   Enterobacteriaceae species NOT DETECTED NOT DETECTED Final   Enterobacter cloacae complex NOT DETECTED NOT DETECTED Final   Escherichia coli NOT DETECTED NOT DETECTED Final   Klebsiella oxytoca NOT DETECTED NOT DETECTED Final   Klebsiella pneumoniae NOT DETECTED NOT DETECTED Final   Proteus species NOT DETECTED NOT DETECTED Final   Serratia marcescens NOT DETECTED  NOT DETECTED Final   Haemophilus influenzae NOT DETECTED NOT DETECTED Final   Neisseria meningitidis NOT DETECTED NOT DETECTED Final   Pseudomonas aeruginosa NOT DETECTED NOT DETECTED Final   Candida albicans NOT DETECTED NOT DETECTED Final   Candida glabrata NOT DETECTED NOT DETECTED Final   Candida krusei NOT DETECTED NOT DETECTED Final   Candida parapsilosis NOT DETECTED NOT DETECTED Final   Candida tropicalis NOT DETECTED NOT DETECTED Final    Comment: Performed at [facility], [place] [address] [place], [place] [address]  Urine culture     Status: Abnormal (Preliminary result)   Collection Time: 12/28/16 12:29 PM  Result Value Ref Range Status   Specimen Description URINE, CLEAN CATCH  Final   Special Requests NONE  Final   Culture 30,000 COLONIES/mL GRAM NEGATIVE RODS (A)  Final   Report Status PENDING  Incomplete  Blood culture (routine x 2)     Status: Abnormal (Preliminary result)   Collection Time: 12/28/16  1:00 PM  Result Value Ref Range Status   Specimen Description BLOOD RIGHT HAND  Final  Special Requests IN PEDIATRIC BOTTLE 2CC  Final   Culture  Setup Time   Final    GRAM NEGATIVE RODS IN PEDIATRIC BOTTLE CRITICAL RESULT CALLED TO, READ BACK BY AND VERIFIED WITH: T GREEN,PHARMD AT 1040 12/29/16 BY L BENFIELD Performed at [facility], [place] [address], [place], [place] [address]    Culture [facility] MORGANII (A)  Final   Report Status PENDING  Incomplete  Blood Culture ID Panel (Reflexed)     Status: None   Collection Time: 12/28/16  1:00 PM  Result Value Ref Range Status   Enterococcus species NOT DETECTED NOT DETECTED Final   Listeria monocytogenes NOT DETECTED NOT DETECTED Final   Staphylococcus species NOT DETECTED NOT DETECTED Final   Staphylococcus aureus NOT DETECTED NOT DETECTED Final   Streptococcus species NOT DETECTED NOT DETECTED Final   Streptococcus agalactiae NOT DETECTED NOT DETECTED Final   Streptococcus pneumoniae NOT DETECTED NOT  DETECTED Final   Streptococcus pyogenes NOT DETECTED NOT DETECTED Final   Acinetobacter baumannii NOT DETECTED NOT DETECTED Final   Enterobacteriaceae species NOT DETECTED NOT DETECTED Final   Enterobacter cloacae complex NOT DETECTED NOT DETECTED Final   Escherichia coli NOT DETECTED NOT DETECTED Final   Klebsiella oxytoca NOT DETECTED NOT DETECTED Final   Klebsiella pneumoniae NOT DETECTED NOT DETECTED Final   Proteus species NOT DETECTED NOT DETECTED Final   Serratia marcescens NOT DETECTED NOT DETECTED Final   Haemophilus influenzae NOT DETECTED NOT DETECTED Final   Neisseria meningitidis NOT DETECTED NOT DETECTED Final   Pseudomonas aeruginosa NOT DETECTED NOT DETECTED Final   Candida albicans NOT DETECTED NOT DETECTED Final   Candida glabrata NOT DETECTED NOT DETECTED Final   Candida krusei NOT DETECTED NOT DETECTED Final   Candida parapsilosis NOT DETECTED NOT DETECTED Final   Candida tropicalis NOT DETECTED NOT DETECTED Final    Comment: Performed at [facility], [place] [address] [place], [place] [address]     Studies: Dg Chest 1 View  Result Date: 12/28/2016 CLINICAL DATA:  Fall with pain.  Paraplegia.  Initial encounter. EXAM: CHEST 1 VIEW COMPARISON:  12/16/2016 and prior chest radiographs FINDINGS: The cardiomediastinal silhouette is unremarkable. There is no evidence of focal airspace disease, pulmonary edema, suspicious pulmonary nodule/mass, pleural effusion, or pneumothorax. No acute bony abnormalities are identified. IMPRESSION: No active disease. Electronically Signed   By: Margarette Canada M.D.   On: 12/28/2016 13:16   Dg Pelvis 1-2 Views  Result Date: 12/28/2016 CLINICAL DATA:  Fall and injury to pelvis.  Initial encounter. EXAM: PELVIS - 1-2 VIEW COMPARISON:  06/15/2015 CT and prior studies FINDINGS: No acute fracture identified. Deformity of both hips again noted. No suspicious focal bony lesions identified. IMPRESSION: No acute abnormality. Electronically Signed    By: Margarette Canada M.D.   On: 12/28/2016 13:18   Dg Tibia/fibula Left  Result Date: 12/28/2016 CLINICAL DATA:  Fall with left lower leg injury.  Initial encounter. EXAM: LEFT TIBIA AND FIBULA - 2 VIEW COMPARISON:  05/30/2016 radiographs FINDINGS: No acute fracture. Chronic deformity within the ankle and foot noted. No acute abnormalities are identified. IMPRESSION: No acute abnormalities. Electronically Signed   By: Margarette Canada M.D.   On: 12/28/2016 13:21   Dg Foot Complete Left  Result Date: 12/28/2016 CLINICAL DATA:  Fall with left foot injury.  Initial encounter. EXAM: LEFT FOOT - COMPLETE 3+ VIEW COMPARISON:  05/30/2016 FINDINGS: An equivocal nondisplaced fracture of the distal metatarsal on the lateral view noted. Soft tissue  swelling is present. Chronic deformity within the ankle and foot again noted. IMPRESSION: Equivocal nondisplaced fracture of the distal metatarsal on the lateral view. Electronically Signed   By: Margarette Canada M.D.   On: 12/28/2016 13:24   Dg Femur Min 2 Views Left  Result Date: 12/28/2016 CLINICAL DATA:  Fall with left leg injury.  Initial encounter. EXAM: LEFT FEMUR 2 VIEWS COMPARISON:  None. FINDINGS: No acute fracture or dislocation identified. Chronic deformity in the hip and knee noted. No suspicious focal bony lesions identified. IMPRESSION: No acute abnormality. Electronically Signed   By: Margarette Canada M.D.   On: 12/28/2016 13:20    Scheduled Meds: . collagenase   Topical Daily  . enoxaparin (LOVENOX) injection  40 mg Subcutaneous Q24H  . piperacillin-tazobactam (ZOSYN)  IV  3.375 g Intravenous Q8H  . simvastatin  20 mg Oral QHS  . sodium chloride flush  3 mL Intravenous Q12H   Continuous Infusions: . dextrose 5 % and 0.45 % NaCl with KCl 40 mEq/L 75 mL/hr at 12/30/16 1110    Principal Problem:   Sepsis ([place]) Active Problems:   Hypertension   History of spina bifida   H/O paraplegia   OSA (obstructive sleep apnea)   Pressure ulcer   Neurogenic bladder    Fall   Obesity (BMI 30-39.9)   Hypernatremia   Hyperlipidemia    Time spent: 25 minutes    Barton Dubois  [facility] Pager [phone]. If 7PM-7AM, please contact night-coverage at [email or web address], password [facility] 12/30/2016, 12:31 PM  LOS: 2 days

## 2016-12-30 NOTE — NC FL2 (Signed)
[place] MEDICAID FL2 LEVEL OF CARE SCREENING TOOL     IDENTIFICATION  Patient Name: Patrick Orozco Birthdate: 03/11/1956 Sex: male Admission Date (Current Location): 12/28/2016  [facility] and [place] Number:  Herbalist and Address:  [facility],  [place] [address], [place]      Provider Number: [id number]  Attending Physician Name and Address:  Barton Dubois, MD  Relative Name and Phone Number:       Current Level of Care: Hospital Recommended Level of Care: [place] Prior Approval Number:    Date Approved/Denied:   PASRR Number: [id number]  Discharge Plan: SNF    Current Diagnoses: Patient Active Problem List   Diagnosis Date Noted  . Neurogenic bladder 12/28/2016  . Sepsis ([place]) 12/28/2016  . Fall 12/28/2016  . Obesity (BMI 30-39.9) 12/28/2016  . Hypernatremia 12/28/2016  . Hyperlipidemia 12/28/2016  . Generalized weakness 10/30/2016  . Left leg cellulitis 10/30/2016  . Prolonged Q-T interval on ECG 10/30/2016  . Pressure ulcer 07/19/2015  . Staghorn kidney stones 07/18/2015  . Ureteral stone with hydronephrosis 06/15/2015  . OSA (obstructive sleep apnea) 07/13/2014  . H/O paraplegia 01/07/2013  . Recurrent kidney stones-non obstructive 01/07/2013  . Sarcoidosis ([place]) 01/07/2013  . Hypertension 01/05/2013  . History of spina bifida 01/05/2013    Orientation RESPIRATION BLADDER Height & Weight     Self, Time, Situation, Place  Normal Incontinent, External catheter Weight:   Height:     BEHAVIORAL SYMPTOMS/MOOD NEUROLOGICAL BOWEL NUTRITION STATUS      Incontinent, Colostomy Diet  AMBULATORY STATUS COMMUNICATION OF NEEDS Skin   Extensive Assist Verbally PU Stage and Appropriate Care (Pressure Injury 12/28/16 Unstageable - Full thickness tissue loss in which the base of the ulcer is covered by slough (yellow, tan, gray, green or brown) and/or eschar (tan, brown or black) in the wound bed. Unstageable with  Blister) (right lateral mid tibial)  PressureInjury02/25/18Unstageable-Fullthicknesstissuelossinwhichthebaseoftheulceriscoveredbyslough(yellow,tan,gray,greenorbrown)and/oreschar(tan,brownorblack)inthewoundbed.1.5x2.5 (lower, lateral right leg)  PressureInjury02/25/18DeepTissueInjury-Purpleormaroonlocalizedareaofdiscoloredintactskinorblood-filledblisterduetodamageofunderlyingsofttissuefrompressureand/orshear.4.5cmx1.5cm (left lateral foot)  PressureInjury02/25/18Unstageable-Fullthicknesstissuelossinwhichthebaseoftheulceriscoveredbyslough(yellow,tan,gray,greenorbrown)and/oreschar(tan,brownorblack)inthewoundbed.1cmx1cm (right lateral toe)  PressureInjury02/25/18Unstageable-Fullthicknesstissuelossinwhichthebaseoftheulceriscoveredbyslough(yellow,tan,gray,greenorbrown)and/oreschar(tan,brownorblack)inthewoundbed.1.5cmx2cm (left lower leg)  PressureInjury02/25/18Unstageable-Fullthicknesstissuelossinwhichthebaseoftheulceriscoveredbyslough(yellow,tan,gray,greenorbrown)and/oreschar(tan,brownorblack)inthewoundbed.2.5cmx3cm (left heel)  PressureInjury02/25/18Unstageable-Fullthicknesstissuelossinwhichthebaseoftheulceriscoveredbyslough(yellow,tan,gray,greenorbrown)and/oreschar(tan,brownorblack)inthewoundbed.largewoundtococcyxthatreachestoBILisch (coccyx)  PressureInjury02/25/18StageIII-Fullthicknesstissueloss.Subcutaneousfatmaybevisiblebutbone,tendonormuscleareNOTexposed.7cmx7cm (posterior left thigh)  Wound/Incision(OpenorDehisced)02/25/18Non-pressurewoundKneeRight;MedialSkintear1.5x2.5 (right medial knee)               Personal Care Assistance Level of Assistance  Bathing, Dressing Bathing Assistance: Limited assistance   Dressing  Assistance: Limited assistance     Functional Limitations Info             SPECIAL CARE FACTORS FREQUENCY  PT (By licensed PT), OT (By licensed OT)     PT Frequency: 5 OT Frequency: 5            Contractures      Additional Factors Info  Code Status, Allergies Code Status Info: Fullcode Allergies Info: Ivp Dye Iodinated Diagnostic Agents           Current Medications (12/30/2016):  This is the current hospital active medication list Current Facility-Administered Medications  Medication Dose Route Frequency Provider Last Rate Last Dose  . acetaminophen (TYLENOL) tablet 650 mg  650 mg Oral Q6H PRN Annita Brod, MD       Or  . acetaminophen (TYLENOL) suppository 650 mg  650 mg Rectal Q6H PRN Annita Brod, MD      . collagenase (SANTYL) ointment   Topical Daily Annita Brod, MD      . dextrose 5 % and 0.45 % NaCl with KCl 40 mEq/L infusion   Intravenous Continuous Minda Ditto, RPH 75 mL/hr  at 12/29/16 2246    . enoxaparin (LOVENOX) injection 40 mg  40 mg Subcutaneous Q24H Annita Brod, MD   40 mg at 12/29/16 1732  . piperacillin-tazobactam (ZOSYN) IVPB 3.375 g  3.375 g Intravenous Q8H Donald Prose Runyon, RPH   3.375 g at 12/30/16 0433  . simvastatin (ZOCOR) tablet 20 mg  20 mg Oral QHS Annita Brod, MD   20 mg at 12/29/16 2119  . sodium chloride flush (NS) 0.9 % injection 3 mL  3 mL Intravenous Q12H Annita Brod, MD      . traMADol Veatrice Bourbon) tablet 50 mg  50 mg Oral Q6H PRN Annita Brod, MD         Discharge Medications: Please see discharge summary for a list of discharge medications.  Relevant Imaging Results:  Relevant Lab Results:   Additional Information SSN: [id number]  Standley Brooking, LCSW

## 2016-12-30 NOTE — Progress Notes (Signed)
Patient states his pain has improved since administration of protonix.  Will continue to monitor.

## 2016-12-30 NOTE — Progress Notes (Signed)
HYDROTHERAPY TREATMENT     12/30/16 1500  Subjective Assessment  Subjective I can feel it a little. It doesn't hurt.  Patient and Family Stated Goals for wounds to heal  Date of Onset 12/28/16  Evaluation and Treatment  Evaluation and Treatment Procedures Explained to Patient/Family Yes  Evaluation and Treatment Procedures agreed to  Pressure Injury 12/28/16 Unstageable - Full thickness tissue loss in which the base of the ulcer is covered by slough (yellow, tan, gray, green or brown) and/or eschar (tan, brown or black) in the wound bed. large wound to coccyx that reaches to BIL isch  Date First Assessed/Time First Assessed: 12/28/16 1959   Location: Coccyx  Staging: Unstageable - Full thickness tissue loss in which the base of the ulcer is covered by slough (yellow, tan, gray, green or brown) and/or eschar (tan, brown or black) in...  Dressing Type Moist to moist;ABD (santyl)  Dressing Changed  Dressing Change Frequency Twice a day  State of Healing Eschar  Site / Wound Assessment Black;Brown;Pink  % Wound base Black/Eschar 75%  % Wound base Other/Granulation Tissue (Comment) 25% (pinkish tissue under debrided leathery eschar)  Drainage Amount Moderate (serosanguinous on dressing)  Drainage Description Odor (small amt of pus-like draining around edge of eschar noted initially when debriding)  Treatment Debridement (Selective);Hydrotherapy (Pulse lavage)  Hydrotherapy  Pulsed lavage therapy - wound location coccyx  Pulsed Lavage with Suction (psi) 8 psi  Pulsed Lavage with Suction - Normal Saline Used 1000 mL  Pulsed Lavage Tip Tip with splash shield  Selective Debridement  Selective Debridement - Location center of coccygeal wound  Selective Debridement - Tools Used Forceps;Scissors  Selective Debridement - Tissue Removed brown/black leathery tissue with pinkish coloring underneath  Wound Therapy - Assess/Plan/Recommendations  Wound Therapy - Clinical Statement 61 yo male with  history of spina bifida, chronic bil LE paralysis, colostomy, COPD, pressure ulcers admitted with sepsis and multiple wounds after falling while transferring to his wheelchair. PT Hydrotherapy ordered to help manage coccygeal wound.   Wound Therapy - Functional Problem List Limited mobility-WC bound, multiple falls, bil LE paralysis 2* spina bifida (imaging showed L foot distal metatarsal fx)  Factors Delaying/Impairing Wound Healing Immobility;Incontinence;Altered sensation  Hydrotherapy Plan Debridement;Dressing change;Pulsatile lavage with suction  Wound Therapy - Frequency 6X / week  Wound Therapy - Current Recommendations Surgery consult;Case manager/social work  Wound Therapy - Follow Up Recommendations Skilled nursing facility  Wound Plan PT Hydrotherapy for pulsed lavage and selective debridement to help manage coccygeal wound and encourage wound healing  Wound Therapy Goals - Improve the function of patient's integumentary system by progressing the wound(s) through the phases of wound healing by:  Decrease Necrotic Tissue to 50%  Decrease Necrotic Tissue - Progress Progressing toward goal  Increase Granulation Tissue to 50%  Increase Granulation Tissue - Progress Progressing toward goal  Goals/treatment plan/discharge plan were made with and agreed upon by patient/family Yes  Time For Goal Achievement 2 weeks  Wound Therapy - Potential for Goals Fair    Weston Anna, MPT [phone]

## 2016-12-30 NOTE — Clinical Social Work Placement (Signed)
   CLINICAL SOCIAL WORK PLACEMENT  NOTE  Date:  12/30/2016  Patient Details  Name: Patrick Orozco MRN: [id number] Date of Birth: Jun 21, 1956  Clinical Social Work is seeking post-discharge placement for this patient at the [place] level of care (*CSW will initial, date and re-position this form in  chart as items are completed):  Yes   Patient/family provided with [place] Work Department's list of facilities offering this level of care within the geographic area requested by the patient (or if unable, by the patient's family).  Yes   Patient/family informed of their freedom to choose among providers that offer the needed level of care, that participate in Medicare, Medicaid or managed care program needed by the patient, have an available bed and are willing to accept the patient.  Yes   Patient/family informed of [facility]'s ownership interest in [facility] and [facility], as well as of the fact that they are under no obligation to receive care at these facilities.  PASRR submitted to EDS on 12/30/16     PASRR number received on       Existing PASRR number confirmed on       FL2 transmitted to all facilities in geographic area requested by pt/family on 12/30/16     FL2 transmitted to all facilities within larger geographic area on 12/30/16     Patient informed that his/her managed care company has contracts with or will negotiate with certain facilities, including the following:            Patient/family informed of bed offers received.  Patient chooses bed at       Physician recommends and patient chooses bed at      Patient to be transferred to   on  .  Patient to be transferred to facility by       Patient family notified on   of transfer.  Name of family member notified:        PHYSICIAN       Additional Comment:    _______________________________________________ Burnis Medin, LCSW 12/30/2016, 11:01 AM

## 2016-12-30 NOTE — Clinical Social Work Note (Signed)
Clinical Social Work Assessment  Patient Details  Name: Patrick Orozco MRN: [id number] Date of Birth: 02-Aug-1956  Date of referral:  12/30/16               Reason for consult:  Facility Placement                Permission sought to share information with:  Facility Art therapist granted to share information::  Yes, Verbal Permission Granted  Name::        Agency::     Relationship::     Contact Information:     Housing/Transportation Living arrangements for the past 2 months:  Apartment Source of Information:  Patient Patient Interpreter Needed:  None Criminal Activity/Legal Involvement Pertinent to Current Situation/Hospitalization:  No - Comment as needed Significant Relationships:  Parents, Siblings Lives with:  Self Do you feel safe going back to the place where you live?  No Need for family participation in patient care:  No (Coment)  Care giving concerns:  Patient resides alone in apartment. Patient reports that he does not want to be alone and that he needs help caring for himself.    Social Worker assessment / plan:  Patient resides alone in apartment and has no one available to care for him. Patient reports that he hasn't been to a SNF in over 30 years. CSW inquired about patient's SNF preference, patient reported that he wanted to go to a SNF in [facility]. CSW completed patient's Fl2 and provide patient with bed offers. CSW will follow up with patient's preference of accepting facilities.   Employment status:  Disabled (Comment on whether or not currently receiving Disability) Insurance information:  Managed Medicare PT Recommendations:   SNF Information / Referral to community resources:  [place]  Patient/Family's Response to care:  Patient agreeable to SNF, reporting that he does not want to be alone.   Patient/Family's Understanding of and Emotional Response to Diagnosis, Current Treatment, and Prognosis:  Patient  verbalized understanding of diagnosis, current treatment and prognosis, noting that he needs assistance with care of his wounds. Patient spoke in soft tone and appeared to be hopeful about rehab at [facility].   Emotional Assessment Appearance:  Appears stated age Attitude/Demeanor/Rapport:  Other (cooperative) Affect (typically observed):  Calm Orientation:  Oriented to Self, Oriented to Place, Oriented to  Time, Oriented to Situation Alcohol / Substance use:  Not Applicable Psych involvement (Current and /or in the community):  No (Comment)  Discharge Needs  Concerns to be addressed:   None Readmission within the last 30 days:  No Current discharge risk:  None Barriers to Discharge:  No Barriers Identified   Burnis Medin, LCSW 12/30/2016, 10:53 AM

## 2016-12-31 DIAGNOSIS — E870 Hyperosmolality and hypernatremia: Secondary | ICD-10-CM

## 2016-12-31 DIAGNOSIS — I10 Essential (primary) hypertension: Secondary | ICD-10-CM

## 2016-12-31 DIAGNOSIS — N319 Neuromuscular dysfunction of bladder, unspecified: Secondary | ICD-10-CM

## 2016-12-31 DIAGNOSIS — A419 Sepsis, unspecified organism: Principal | ICD-10-CM

## 2016-12-31 DIAGNOSIS — G4733 Obstructive sleep apnea (adult) (pediatric): Secondary | ICD-10-CM

## 2016-12-31 LAB — URINE CULTURE

## 2016-12-31 LAB — CBC
HEMATOCRIT: 30.5 % — AB (ref 39.0–52.0)
Hemoglobin: 9.4 g/dL — ABNORMAL LOW (ref 13.0–17.0)
MCH: 26.8 pg (ref 26.0–34.0)
MCHC: 30.8 g/dL (ref 30.0–36.0)
MCV: 86.9 fL (ref 78.0–100.0)
Platelets: 118 10*3/uL — ABNORMAL LOW (ref 150–400)
RBC: 3.51 MIL/uL — ABNORMAL LOW (ref 4.22–5.81)
RDW: 16.6 % — AB (ref 11.5–15.5)
WBC: 6.3 10*3/uL (ref 4.0–10.5)

## 2016-12-31 LAB — BASIC METABOLIC PANEL
Anion gap: 3 — ABNORMAL LOW (ref 5–15)
BUN: 12 mg/dL (ref 6–20)
CO2: 22 mmol/L (ref 22–32)
Calcium: 8.1 mg/dL — ABNORMAL LOW (ref 8.9–10.3)
Chloride: 119 mmol/L — ABNORMAL HIGH (ref 101–111)
Creatinine, Ser: 0.97 mg/dL (ref 0.61–1.24)
GFR calc Af Amer: >60 mL/min (ref >60)
GLUCOSE: 95 mg/dL (ref 65–99)
POTASSIUM: 4 mmol/L (ref 3.5–5.1)
Sodium: 144 mmol/L (ref 135–145)

## 2016-12-31 LAB — CULTURE, BLOOD (ROUTINE X 2)

## 2016-12-31 LAB — LACTIC ACID, PLASMA: LACTIC ACID, VENOUS: 1.6 mmol/L (ref 0.5–1.9)

## 2016-12-31 MED ORDER — SULFAMETHOXAZOLE-TRIMETHOPRIM 800-160 MG PO TABS
1.0000 | ORAL_TABLET | Freq: Two times a day (BID) | ORAL | Status: DC
  Administered 2016-12-31 – 2017-01-01 (×3): 1 via ORAL
  Filled 2016-12-31 (×3): qty 1

## 2016-12-31 MED ORDER — ENSURE ENLIVE PO LIQD
237.0000 mL | Freq: Three times a day (TID) | ORAL | Status: DC
  Administered 2016-12-31 – 2017-01-01 (×2): 237 mL via ORAL

## 2016-12-31 MED ORDER — SULFAMETHOXAZOLE-TRIMETHOPRIM 800-160 MG PO TABS: 1.0000 | ORAL_TABLET | Freq: Two times a day (BID) | ORAL | Status: DC

## 2016-12-31 MED ORDER — AMOXICILLIN-POT CLAVULANATE 875-125 MG PO TABS
1.0000 | ORAL_TABLET | Freq: Two times a day (BID) | ORAL | Status: DC
  Administered 2016-12-31 – 2017-01-01 (×3): 1 via ORAL
  Filled 2016-12-31 (×3): qty 1

## 2016-12-31 MED ORDER — PIPERACILLIN-TAZOBACTAM 3.375 G IVPB 30 MIN: 3.3750 g | Freq: Four times a day (QID) | INTRAVENOUS | Status: DC

## 2016-12-31 MED ORDER — SULFAMETHOXAZOLE-TRIMETHOPRIM 800-160 MG PO TABS
1.0000 | ORAL_TABLET | Freq: Two times a day (BID) | ORAL | Status: DC
  Filled 2016-12-31: qty 1

## 2016-12-31 MED ORDER — SACCHAROMYCES BOULARDII 250 MG PO CAPS
250.0000 mg | ORAL_CAPSULE | Freq: Two times a day (BID) | ORAL | Status: DC
  Administered 2016-12-31 – 2017-01-01 (×3): 250 mg via ORAL
  Filled 2016-12-31 (×3): qty 1

## 2016-12-31 MED ORDER — PIPERACILLIN-TAZOBACTAM 3.375 G IVPB
3.3750 g | Freq: Three times a day (TID) | INTRAVENOUS | Status: DC
  Administered 2016-12-31: 3.375 g via INTRAVENOUS
  Filled 2016-12-31: qty 50

## 2016-12-31 MED ORDER — ADULT MULTIVITAMIN W/MINERALS CH
1.0000 | ORAL_TABLET | Freq: Every day | ORAL | Status: DC
  Administered 2016-12-31 – 2017-01-01 (×2): 1 via ORAL
  Filled 2016-12-31 (×2): qty 1

## 2016-12-31 MED ORDER — AMOXICILLIN-POT CLAVULANATE 875-125 MG PO TABS: 1.0000 | ORAL_TABLET | Freq: Two times a day (BID) | ORAL | Status: DC

## 2016-12-31 NOTE — Progress Notes (Signed)
Initial Nutrition Assessment  DOCUMENTATION CODES:   Morbid obesity  INTERVENTION:   Provide Ensure Enlive po TID, each supplement provides 350 kcal and 20 grams of protein Provide Multivitamin with minerals daily Encourage PO intakes, emphasizing protein consumption RD to continue to monitor  NUTRITION DIAGNOSIS:   Increased nutrient needs related to wound healing as evidenced by estimated needs.  GOAL:   Patient will meet greater than or equal to 90% of their needs  MONITOR:   PO intake, Supplement acceptance, Labs, Weight trends, Skin, I & O's  REASON FOR ASSESSMENT:   Low Braden    ASSESSMENT:   61 y.o. male with medical history significant of spina bifida and paraplegia who lives at home by himself and is wheelchair-bound presented to the emergency room after a fall today where he got out of bed and missed his wheelchair. He lhas had multiple images done in ED and no acute fracture appreciated. He was found with multiples pressure injuries and a big one in his coccyx area that suggested superimposed infection. Patient without fever, but with elevated lactic acid, bandemia (despite normal WBC's count), tachycardia and hypernatremia  Patient in room with no family at bedside. Pt states he is unsure what he had for breakfast this morning and was unable to tell RD if he ate at all. Pt states he had good appetite and was eating with no issue PTA. Pt states he is willing to try Ensure supplements. Will order supplements and MVI.  RD explained the role of protein in wound healing.   Pt states he is unsure what his UBW is, he made a guess of 150 lb but states it is hard to know d/t being wheelchair bound. Per chart review, pt's weight is increased since 2/14, likely related to fluid.  Nutrition focused physical exam shows no sign of depletion of muscle mass or body fat.  Labs reviewed. Medications: Protonix tablet BID, D5 and .45% NaCl w/ KCl infusion at 75 ml/hr -provides 306  kcal  Diet Order:  Diet 2 gram sodium Room service appropriate? Yes; Fluid consistency: Thin  Skin:   Unstageable coccyx wound, Stage III thigh, skin tear on knee, heel ulcer, unstageable tibial and leg wound, DTI on foot, Unstageable toe wound  Last BM:  2/27  Height:   Ht Readings from Last 1 Encounters:  12/31/16 '5\' 5"'$  (1.651 m)    Weight:   Wt Readings from Last 1 Encounters:  12/31/16 249 lb 1.9 oz (113 kg)    Ideal Body Weight:  58.6 kg  BMI:  Body mass index is 41.46 kg/m.  Estimated Nutritional Needs:   Kcal:  2400-2600  Protein:  120-130g  Fluid:  2L/day  EDUCATION NEEDS:   Education needs addressed  Clayton Bibles, MS, RD, LDN Pager: [phone] After Hours Pager: [phone]

## 2016-12-31 NOTE — Progress Notes (Signed)
HYDROTHERAPY TREATMENT    12/31/16 1500  Subjective Assessment  Subjective Im alright  Patient and Family Stated Goals for wounds to heal  Date of Onset 12/28/16  Evaluation and Treatment  Evaluation and Treatment Procedures Explained to Patient/Family Yes  Evaluation and Treatment Procedures agreed to  Pressure Injury 12/28/16 Unstageable - Full thickness tissue loss in which the base of the ulcer is covered by slough (yellow, tan, gray, green or brown) and/or eschar (tan, brown or black) in the wound bed. large wound to coccyx that reaches to BIL isch  Date First Assessed/Time First Assessed: 12/28/16 1959   Location: Coccyx  Staging: Unstageable - Full thickness tissue loss in which the base of the ulcer is covered by slough (yellow, tan, gray, green or brown) and/or eschar (tan, brown or black) in...  Dressing Type Moist to moist;ABD Santyl  Dressing Changed  Dressing Change Frequency Twice a day  State of Healing Eschar  % Wound base Black/Eschar 75%  % Wound base Other/Granulation Tissue (Comment) 25% (dusky pink nonviable tissue underneath leathery eschar layer)  Drainage Amount Moderate  Drainage Description Serosanguineous;Odor  Treatment Debridement (Selective);Hydrotherapy (Pulse lavage)  Hydrotherapy  Pulsed lavage therapy - wound location coccyx  Pulsed Lavage with Suction (psi) 8 psi  Pulsed Lavage with Suction - Normal Saline Used 1000 mL  Pulsed Lavage Tip Tip with splash shield  Selective Debridement  Selective Debridement - Location center of coccygeal wound  Selective Debridement - Tools Used Forceps;Scissors  Selective Debridement - Tissue Removed brown/black leathery tissue with pinkish coloring underneath  Wound Therapy - Assess/Plan/Recommendations  Wound Therapy - Clinical Statement 61 yo male with history of spina bifida, chronic bil LE paralysis, colostomy, COPD, pressure ulcers admitted with sepsis and multiple wounds after falling while transferring to  his wheelchair. PT Hydrotherapy ordered to help manage coccygeal wound.   Wound Therapy - Functional Problem List Limited mobility-WC bound, multiple falls, bil LE paralysis 2* spina bifida (imaging showed L foot metatarsal fx)  Factors Delaying/Impairing Wound Healing Immobility;Incontinence;Altered sensation  Hydrotherapy Plan Debridement;Dressing change;Pulsatile lavage with suction  Wound Therapy - Frequency 6X / week  Wound Therapy - Current Recommendations Case manager/social work;Surgery consult;WOC nurse (spoke with MD about possibly having surgery consult again prior to discharge)  Wound Therapy - Follow Up Recommendations Skilled nursing facility  Wound Plan PT Hydrotherapy for pulsed lavage and selective debridement to help manage coccygeal wound and encourage wound healing  Wound Therapy Goals - Improve the function of patient's integumentary system by progressing the wound(s) through the phases of wound healing by:  Decrease Necrotic Tissue to 50%  Decrease Necrotic Tissue - Progress Progressing toward goal  Increase Granulation Tissue to 50%  Increase Granulation Tissue - Progress Progressing toward goal  Goals/treatment plan/discharge plan were made with and agreed upon by patient/family Yes  Time For Goal Achievement 2 weeks  Wound Therapy - Potential for Goals Fair   Weston Anna, MPT [phone]

## 2016-12-31 NOTE — Progress Notes (Addendum)
[facility] PROGRESS NOTE    Progress Note  Patrick Orozco  IOM:[id number] DOB: [birth year] DOA: 12/28/2016 PCP: Kristine Garbe, MD     Brief Narrative:   Patrick Orozco is an 61 y.o. male with medical history significant of spina bifida and paraplegia who lives at home by himself and is wheelchair-bound presented to the emergency room after a fall today where he got out of bed and missed his wheelchair. He lhas had multiple images done in ED and no acute fracture appreciated. He was found with multiples pressure injuries and a big one in his coccyx area that suggested superimposed infection. Patient without fever, but with elevated lactic acid, bandemia (despite normal WBC's count), tachycardia and hypernatremia  Assessment/Plan:   Sepsis ([place]) due to Superimposed infection on decubitus ulcer: Has remained afebrile, on IV Zosyn. Wound care was consulted who recommended hydrotherapy, surgery was consulted and recommended to continue current management. Continue hydrotherapy.  Hyponatremia: He was started on oral free water, with D5 half-normal and his sodium has stabilized today. Likely prerenal.  Essential hypertension: Stable for now continue current management.  Obstructive sleep apnea: Continue C-pap  Unstageable multiple pressure injury  will follow wound care rec's air mattress and prevalon boots. Surgery has evaluate the wound recommended no further intervention.  HLD: Continue statins  Paraplegia  -will provide supportive care -SNF at discharge  -SW involved and patient in agreement    Obesity (BMI 30-39.9)  Bacteremia due to Streptococcus group A and Morganella: Has remained afebrile on Zosyn.  Repeat blood cultures have been order today. D/w ID recommended to treat orally for 2 weeks.  DVT prophylaxis: lovenox Family Communication:none Disposition Plan/Barrier to D/C: rehab in am Code Status:     Code Status Orders        Start      Ordered   12/28/16 1622  Full code  Continuous     12/28/16 1621    Code Status History    Date Active Date Inactive Code Status Order ID Comments User Context   10/30/2016  5:37 PM 11/05/2016  6:37 PM Full Code [id number]  Samuella Cota, MD Inpatient   07/18/2015  4:35 PM 07/21/2015  2:11 PM Full Code [id number]  Alexis Frock, MD Inpatient   06/16/2015 12:34 AM 06/18/2015  3:17 PM Full Code [id number]  Raynelle Bring, MD Inpatient   04/25/2013  9:23 AM 04/26/2013  5:45 PM Full Code [id number]  Collene Gobble, MD Inpatient   01/05/2013  9:22 PM 01/11/2013  2:20 PM Full Code [id number]  Samuella Cota, MD Inpatient        IV Access:    Peripheral IV   Procedures and diagnostic studies:   No results found.   Medical Consultants:    None.  Anti-Infectives:   Zosyn  Subjective:    Patrick Orozco feels better now complains.  Objective:    Vitals:   12/30/16 1226 12/30/16 2144 12/31/16 0405 12/31/16 1049  BP: 104/78 119/71 108/74   Pulse: 66 86 78   Resp: '18 20 20   '$ Temp: 97.4 F (36.3 C) 98.9 F (37.2 C) 97.8 F (36.6 C)   TempSrc: Oral Oral Oral   SpO2: 100% 100% 100%   Weight:    113 kg (249 lb 1.9 oz)  Height:    '5\' 5"'$  (1.651 m)    Intake/Output Summary (Last 24 hours) at 12/31/16 1058 Last data filed at 12/31/16 1041  Gross per 24 hour  Intake  1950 ml  Output             6075 ml  Net            -4125 ml   Filed Weights   12/31/16 1049  Weight: 113 kg (249 lb 1.9 oz)    Exam: General exam: In no acute distress. Respiratory system: Good air movement and clear to auscultation. Cardiovascular system: S1 & S2 heard, RRR.  Gastrointestinal system: Abdomen is nondistended, soft and nontender.  Extremities: No pedal edema. Skin: No rashes, lesions or ulcers Psychiatry: Judgement and insight appear normal. Mood & affect appropriate.    Data Reviewed:    Labs: Basic Metabolic Panel:  Recent Labs Lab 12/28/16 1147 12/29/16 0629  12/30/16 1051 12/31/16 0505  NA 151* 148* 145 144  K 4.1 3.0* 3.9 4.0  CL 119* 122* 120* 119*  CO2 22 21* 22 22  GLUCOSE 109* 100* 93 95  BUN 30* 22* 12 12  CREATININE 1.09 1.12 0.98 0.97  CALCIUM 9.2 8.2* 8.0* 8.1*   GFR Estimated Creatinine Clearance: 92.9 mL/min (by C-G formula based on SCr of 0.97 mg/dL). Liver Function Tests:  Recent Labs Lab 12/28/16 1147  AST 30  ALT 20  ALKPHOS 61  BILITOT 0.7  PROT 7.9  ALBUMIN 2.4*   No results for input(s): LIPASE, AMYLASE in the last 168 hours. No results for input(s): AMMONIA in the last 168 hours. Coagulation profile  Recent Labs Lab 12/28/16 2022  INR 1.50    CBC:  Recent Labs Lab 12/28/16 1147 12/29/16 0629 12/31/16 0505  WBC 6.3 4.1 6.3  NEUTROABS 4.9  --   --   HGB 12.3* 10.1* 9.4*  HCT 38.3* 33.0* 30.5*  MCV 86.5 85.5 86.9  PLT 167 113* 118*   Cardiac Enzymes: No results for input(s): CKTOTAL, CKMB, CKMBINDEX, TROPONINI in the last 168 hours. BNP (last 3 results) No results for input(s): PROBNP in the last 8760 hours. CBG: No results for input(s): GLUCAP in the last 168 hours. D-Dimer: No results for input(s): DDIMER in the last 72 hours. Hgb A1c: No results for input(s): HGBA1C in the last 72 hours. Lipid Profile: No results for input(s): CHOL, HDL, LDLCALC, TRIG, CHOLHDL, LDLDIRECT in the last 72 hours. Thyroid function studies: No results for input(s): TSH, T4TOTAL, T3FREE, THYROIDAB in the last 72 hours.  Invalid input(s): FREET3 Anemia work up: No results for input(s): VITAMINB12, FOLATE, FERRITIN, TIBC, IRON, RETICCTPCT in the last 72 hours. Sepsis Labs:  Recent Labs Lab 12/28/16 1147  12/28/16 2022 12/29/16 0629 12/30/16 1051 12/31/16 0505  PROCALCITON  --   --  1.89  --   --   --   WBC 6.3  --   --  4.1  --  6.3  LATICACIDVEN  --   < > 2.8* 2.7* 2.7* 1.6  < > = values in this interval not displayed. Microbiology Recent Results (from the past 240 hour(s))  Blood culture  (routine x 2)     Status: Abnormal   Collection Time: 12/28/16 11:45 AM  Result Value Ref Range Status   Specimen Description BLOOD LEFT UPPER ARM  5 ML IN [facility] BOTTLE  Final   Special Requests BOTTLES DRAWN AEROBIC AND ANAEROBIC 5CC  Final   Culture  Setup Time   Final    IN BOTH AEROBIC AND ANAEROBIC BOTTLES GRAM POSITIVE COCCI IN CHAINS CRITICAL RESULT CALLED TO, READ BACK BY AND VERIFIED WITH: TO EJACKSON(PHARMD) BY TCLEVELAND AT 12/29/16 AT 6:00AM Performed at  [facility], [place] [address], [place], [place] [address]    Culture STREPTOCOCCUS GROUP G (A)  Final   Report Status 12/31/2016 FINAL  Final   Organism ID, Bacteria STREPTOCOCCUS GROUP G  Final      Susceptibility   Streptococcus group g - MIC*    CLINDAMYCIN <=0.25 SENSITIVE Sensitive     AMPICILLIN <=0.25 SENSITIVE Sensitive     ERYTHROMYCIN <=0.12 SENSITIVE Sensitive     VANCOMYCIN 0.5 SENSITIVE Sensitive     CEFTRIAXONE <=0.12 SENSITIVE Sensitive     LEVOFLOXACIN 1 SENSITIVE Sensitive     * STREPTOCOCCUS GROUP G  Blood Culture ID Panel (Reflexed)     Status: Abnormal   Collection Time: 12/28/16 11:45 AM  Result Value Ref Range Status   Enterococcus species NOT DETECTED NOT DETECTED Final   Listeria monocytogenes NOT DETECTED NOT DETECTED Final   Staphylococcus species NOT DETECTED NOT DETECTED Final   Staphylococcus aureus NOT DETECTED NOT DETECTED Final   Streptococcus species DETECTED (A) NOT DETECTED Final    Comment: Not Enterococcus species, Streptococcus agalactiae, Streptococcus pyogenes, or Streptococcus pneumoniae. CRITICAL RESULT CALLED TO, READ BACK BY AND VERIFIED WITH: TO EJACKSON(PHARMD) BY TCLEVELAND AT 12/29/2016 AT 6:00AM    Streptococcus agalactiae NOT DETECTED NOT DETECTED Final   Streptococcus pneumoniae NOT DETECTED NOT DETECTED Final   Streptococcus pyogenes NOT DETECTED NOT DETECTED Final   Acinetobacter baumannii NOT DETECTED NOT DETECTED Final   Enterobacteriaceae species NOT DETECTED  NOT DETECTED Final   Enterobacter cloacae complex NOT DETECTED NOT DETECTED Final   Escherichia coli NOT DETECTED NOT DETECTED Final   Klebsiella oxytoca NOT DETECTED NOT DETECTED Final   Klebsiella pneumoniae NOT DETECTED NOT DETECTED Final   Proteus species NOT DETECTED NOT DETECTED Final   Serratia marcescens NOT DETECTED NOT DETECTED Final   Haemophilus influenzae NOT DETECTED NOT DETECTED Final   Neisseria meningitidis NOT DETECTED NOT DETECTED Final   Pseudomonas aeruginosa NOT DETECTED NOT DETECTED Final   Candida albicans NOT DETECTED NOT DETECTED Final   Candida glabrata NOT DETECTED NOT DETECTED Final   Candida krusei NOT DETECTED NOT DETECTED Final   Candida parapsilosis NOT DETECTED NOT DETECTED Final   Candida tropicalis NOT DETECTED NOT DETECTED Final    Comment: Performed at [facility], [place] [address] [place], [place] [address]  Urine culture     Status: Abnormal   Collection Time: 12/28/16 12:29 PM  Result Value Ref Range Status   Specimen Description URINE, CLEAN CATCH  Final   Special Requests NONE  Final   Culture (A)  Final    30,000 COLONIES/mL CITROBACTER FREUNDII 30,000 COLONIES/mL PROTEUS MIRABILIS    Report Status 12/31/2016 FINAL  Final   Organism ID, Bacteria CITROBACTER FREUNDII (A)  Final   Organism ID, Bacteria PROTEUS MIRABILIS (A)  Final      Susceptibility   Citrobacter freundii - MIC*    CEFAZOLIN >=64 RESISTANT Resistant     CEFTRIAXONE <=1 SENSITIVE Sensitive     CIPROFLOXACIN <=0.25 SENSITIVE Sensitive     GENTAMICIN <=1 SENSITIVE Sensitive     IMIPENEM <=0.25 SENSITIVE Sensitive     NITROFURANTOIN 32 SENSITIVE Sensitive     TRIMETH/SULFA <=20 SENSITIVE Sensitive     PIP/TAZO <=4 SENSITIVE Sensitive     * 30,000 COLONIES/mL CITROBACTER FREUNDII   Proteus mirabilis - MIC*    AMPICILLIN <=2 SENSITIVE Sensitive     CEFAZOLIN <=4 SENSITIVE Sensitive     CEFTRIAXONE <=1 SENSITIVE Sensitive  CIPROFLOXACIN <=0.25 SENSITIVE Sensitive      GENTAMICIN <=1 SENSITIVE Sensitive     IMIPENEM 1 SENSITIVE Sensitive     NITROFURANTOIN 128 RESISTANT Resistant     TRIMETH/SULFA <=20 SENSITIVE Sensitive     AMPICILLIN/SULBACTAM <=2 SENSITIVE Sensitive     PIP/TAZO <=4 SENSITIVE Sensitive     * 30,000 COLONIES/mL PROTEUS MIRABILIS  Blood culture (routine x 2)     Status: Abnormal   Collection Time: 12/28/16  1:00 PM  Result Value Ref Range Status   Specimen Description BLOOD RIGHT HAND  Final   Special Requests IN PEDIATRIC BOTTLE 2CC  Final   Culture  Setup Time   Final    GRAM NEGATIVE RODS IN PEDIATRIC BOTTLE CRITICAL RESULT CALLED TO, READ BACK BY AND VERIFIED WITH: T GREEN,PHARMD AT 1040 12/29/16 BY L BENFIELD Performed at [facility], [place] [address], [place], [place] [address]    Culture [facility] MORGANII (A)  Final   Report Status 12/31/2016 FINAL  Final   Organism ID, Bacteria MORGANELLA MORGANII  Final      Susceptibility   Morganella morganii - MIC*    AMPICILLIN >=32 RESISTANT Resistant     CEFAZOLIN >=64 RESISTANT Resistant     CEFEPIME <=1 SENSITIVE Sensitive     CEFTAZIDIME <=1 SENSITIVE Sensitive     CEFTRIAXONE <=1 SENSITIVE Sensitive     CIPROFLOXACIN <=0.25 SENSITIVE Sensitive     GENTAMICIN <=1 SENSITIVE Sensitive     IMIPENEM 1 SENSITIVE Sensitive     TRIMETH/SULFA <=20 SENSITIVE Sensitive     AMPICILLIN/SULBACTAM 16 INTERMEDIATE Intermediate     PIP/TAZO <=4 SENSITIVE Sensitive     * MORGANELLA MORGANII  Blood Culture ID Panel (Reflexed)     Status: None   Collection Time: 12/28/16  1:00 PM  Result Value Ref Range Status   Enterococcus species NOT DETECTED NOT DETECTED Final   Listeria monocytogenes NOT DETECTED NOT DETECTED Final   Staphylococcus species NOT DETECTED NOT DETECTED Final   Staphylococcus aureus NOT DETECTED NOT DETECTED Final   Streptococcus species NOT DETECTED NOT DETECTED Final   Streptococcus agalactiae NOT DETECTED NOT DETECTED Final   Streptococcus pneumoniae NOT  DETECTED NOT DETECTED Final   Streptococcus pyogenes NOT DETECTED NOT DETECTED Final   Acinetobacter baumannii NOT DETECTED NOT DETECTED Final   Enterobacteriaceae species NOT DETECTED NOT DETECTED Final   Enterobacter cloacae complex NOT DETECTED NOT DETECTED Final   Escherichia coli NOT DETECTED NOT DETECTED Final   Klebsiella oxytoca NOT DETECTED NOT DETECTED Final   Klebsiella pneumoniae NOT DETECTED NOT DETECTED Final   Proteus species NOT DETECTED NOT DETECTED Final   Serratia marcescens NOT DETECTED NOT DETECTED Final   Haemophilus influenzae NOT DETECTED NOT DETECTED Final   Neisseria meningitidis NOT DETECTED NOT DETECTED Final   Pseudomonas aeruginosa NOT DETECTED NOT DETECTED Final   Candida albicans NOT DETECTED NOT DETECTED Final   Candida glabrata NOT DETECTED NOT DETECTED Final   Candida krusei NOT DETECTED NOT DETECTED Final   Candida parapsilosis NOT DETECTED NOT DETECTED Final   Candida tropicalis NOT DETECTED NOT DETECTED Final    Comment: Performed at [facility], [place]. [address] [place], [place] [address]     Medications:   . collagenase   Topical Daily  . enoxaparin (LOVENOX) injection  40 mg Subcutaneous Q24H  . pantoprazole  40 mg Oral BID  . piperacillin-tazobactam (ZOSYN)  IV  3.375 g Intravenous Q8H  . simvastatin  20 mg Oral QHS  .  sodium chloride flush  3 mL Intravenous Q12H   Continuous Infusions: . dextrose 5 % and 0.45 % NaCl with KCl 40 mEq/L 75 mL/hr at 12/31/16 0037    Time spent: 25 min   LOS: 3 days   Charlynne Cousins  [facility] Pager [phone]  *Please refer to [email or web address], password [email or web address] to get updated schedule on who will round on this patient, as hospitalists switch teams weekly. If 7PM-7AM, please contact night-coverage at [email or web address], password [email or web address] for any overnight needs.  12/31/2016, 10:58 AM

## 2017-01-01 MED ORDER — AMOXICILLIN-POT CLAVULANATE 875-125 MG PO TABS
1.0000 | ORAL_TABLET | Freq: Two times a day (BID) | ORAL | 0 refills | Status: AC
Start: 2017-01-01 — End: 2017-01-14

## 2017-01-01 MED ORDER — COLLAGENASE 250 UNIT/GM EX OINT: TOPICAL_OINTMENT | Freq: Every day | CUTANEOUS | 0 refills | Status: AC

## 2017-01-01 MED ORDER — SACCHAROMYCES BOULARDII 250 MG PO CAPS: 250.0000 mg | ORAL_CAPSULE | Freq: Two times a day (BID) | ORAL | 0 refills | Status: AC

## 2017-01-01 MED ORDER — SULFAMETHOXAZOLE-TRIMETHOPRIM 800-160 MG PO TABS: 1.0000 | ORAL_TABLET | Freq: Two times a day (BID) | ORAL | 0 refills | Status: AC

## 2017-01-01 NOTE — Progress Notes (Signed)
HYDROTHERAPY TREATMENT    01/01/17 1100  Subjective Assessment  Subjective Im alright  Patient and Family Stated Goals for wounds to heal  Date of Onset 12/28/16  Evaluation and Treatment  Evaluation and Treatment Procedures Explained to Patient/Family Yes  Evaluation and Treatment Procedures agreed to  Pressure Injury 12/28/16 Unstageable - Full thickness tissue loss in which the base of the ulcer is covered by slough (yellow, tan, gray, green or brown) and/or eschar (tan, brown or black) in the wound bed. large wound to coccyx that reaches to BIL isch  Date First Assessed/Time First Assessed: 12/28/16 1959   Location: Coccyx  Staging: Unstageable - Full thickness tissue loss in which the base of the ulcer is covered by slough (yellow, tan, gray, green or brown) and/or eschar (tan, brown or black) in...  Dressing Type Moist to moist;ABD (Santyl)  Dressing Changed  Dressing Change Frequency Twice a day  State of Healing Eschar  Site / Wound Assessment Black;Brown;Pink;Yellow  % Wound base Black/Eschar 75%  % Wound base Other/Granulation Tissue (Comment) 25% (pinkish nonviable tissue)  Peri-wound Assessment Bleeding;Excoriated;Maceration  Drainage Amount Moderate  Drainage Description Serosanguineous;Odor  Treatment Hydrotherapy (Pulse lavage);Debridement (Selective)  Hydrotherapy  Pulsed lavage therapy - wound location coccyx  Pulsed Lavage with Suction (psi) 8 psi  Pulsed Lavage with Suction - Normal Saline Used 1000 mL  Pulsed Lavage Tip Tip with splash shield  Selective Debridement  Selective Debridement - Location center of coccygeal wound  Selective Debridement - Tools Used Forceps;Scissors  Selective Debridement - Tissue Removed brown/black leathery tissue with pinkish coloring underneath  Wound Therapy - Assess/Plan/Recommendations  Wound Therapy - Clinical Statement 61 yo male with history of spina bifida, chronic bil LE paralysis, colostomy, COPD, pressure ulcers admitted  with sepsis and multiple wounds after falling while transferring to his wheelchair. PT Hydrotherapy ordered to help manage coccygeal wound.   Wound Therapy - Functional Problem List Limited mobility-WC bound, multiple falls, bil LE paralysis 2* spina bifida (imaging showed L foot metatarsal fx)  Factors Delaying/Impairing Wound Healing Immobility;Incontinence;Altered sensation  Hydrotherapy Plan Debridement;Dressing change;Pulsatile lavage with suction  Wound Therapy - Frequency 6X / week  Wound Therapy - Current Recommendations Case manager/social work;Surgery consult;WOC nurse (spoke with MD about having surgery consult again prior to d/c)  Wound Therapy - Follow Up Recommendations Skilled nursing facility  Wound Plan PT Hydrotherapy for pulsed lavage and selective debridement to help manage coccygeal wound and encourage wound healing  Wound Therapy Goals - Improve the function of patient's integumentary system by progressing the wound(s) through the phases of wound healing by:  Decrease Necrotic Tissue to 50%  Decrease Necrotic Tissue - Progress Progressing toward goal  Increase Granulation Tissue to 50%  Increase Granulation Tissue - Progress Progressing toward goal  Goals/treatment plan/discharge plan were made with and agreed upon by patient/family Yes  Time For Goal Achievement 2 weeks  Wound Therapy - Potential for Goals Fair   Weston Anna, MPT [phone]

## 2017-01-01 NOTE — Care Management Important Message (Signed)
Important Message  Patient Details  Name: Patrick Orozco MRN: [id number] Date of Birth: 1956/06/27   Medicare Important Message Given:  Yes    Kerin Salen 01/01/2017, 10:50 AMImportant Message  Patient Details  Name: Patrick Orozco MRN: [id number] Date of Birth: [birth year]   Medicare Important Message Given:  Yes    Kerin Salen 01/01/2017, 10:50 AM

## 2017-01-01 NOTE — Clinical Social Work Placement (Signed)
Patient is set to discharge to [facility] today. Patient made aware. Discharge packet given to RN, Isabell Jarvis called for transport.     Raynaldo Opitz, [facility] Clinical Social Worker cell #: [phone]    CLINICAL SOCIAL WORK PLACEMENT  NOTE  Date:  01/01/2017  Patient Details  Name: Patrick Orozco MRN: [id number] Date of Birth: February 19, 1956  Clinical Social Work is seeking post-discharge placement for this patient at the [place] level of care (*CSW will initial, date and re-position this form in  chart as items are completed):  Yes   Patient/family provided with [place] Work Department's list of facilities offering this level of care within the geographic area requested by the patient (or if unable, by the patient's family).  Yes   Patient/family informed of their freedom to choose among providers that offer the needed level of care, that participate in Medicare, Medicaid or managed care program needed by the patient, have an available bed and are willing to accept the patient.  Yes   Patient/family informed of [facility]'s ownership interest in [facility] and [facility], as well as of the fact that they are under no obligation to receive care at these facilities.  PASRR submitted to [employer] on 12/30/16     PASRR number received on       Existing PASRR number confirmed on       FL2 transmitted to all facilities in geographic area requested by pt/family on 12/30/16     FL2 transmitted to all facilities within larger geographic area on 12/30/16     Patient informed that his/her managed care company has contracts with or will negotiate with certain facilities, including the following:        Yes   Patient/family informed of bed offers received.  Patient chooses bed at [facility]     Physician recommends and patient chooses bed at      Patient to be transferred to [facility] on 01/01/17.  Patient  to be transferred to facility by [facility]     Patient family notified on 01/01/17 of transfer.  Name of family member notified:        PHYSICIAN       Additional Comment:    _______________________________________________ Standley Brooking, LCSW 01/01/2017, 12:49 PM

## 2017-01-01 NOTE — Progress Notes (Signed)
Attempted to call report to [facility] 2 times.  No answer once I was transferred to the nursing station.  Phone number given to transport for them to call me if any questions.

## 2017-01-01 NOTE — Discharge Summary (Signed)
Physician Discharge Summary  Patrick Orozco MHD:[id number] DOB: [birth year] DOA: 12/28/2016  PCP: Kristine Garbe, MD  Admit date: 12/28/2016 Discharge date: 01/01/2017  Admitted From: home(Home, ALF, ILF, SNF) Disposition:  SNF  Recommendations for Outpatient Follow-up:  1. Follow up with PCP in 1-2 weeks 2. Please obtain BMP/CBC in one week 3. Please turn patient every 2 hours. 4. We'll continue oral antibiotics until 01/14/2017. 5. Please place a low air mattress. 6. PT for hydrotherapy 6x/week with collagenase enzymatic debridement agent topped with NS for wound care   Home Health:No Equipment/Devices:none  Discharge Condition:Stable CODE STATUS:full Diet recommendation: Heart Healthy  Brief/Interim Summary: 61 y.o. male with medical history significant of spina bifida and paraplegia who lives at home by himself and is wheelchair-bound presented to the emergency room after a fall today where he got out of bed and missed his wheelchair. He lhas had multiple images done in ED and no acute fracture appreciated. He was found with multiples pressure injuries and a big one in his coccyx area that suggested superimposed infection. Patient without fever, but with elevated lactic acid, bandemia (despite normal WBC's count), tachycardia and hypernatremia  Discharge Diagnoses:  Principal Problem:   Sepsis ([place]) Active Problems:   Hypertension   History of spina bifida   H/O paraplegia   OSA (obstructive sleep apnea)   Pressure ulcer   Neurogenic bladder   Fall   Obesity (BMI 30-39.9)   Hypernatremia   Hyperlipidemia  Sepsis ([place]) due to Superimposed infection on decubitus ulcer: Started empirically on IV vancomycin and Zosyn Wound care was consulted who recommended hydrotherapy. Surgery was consulted recommendedfurther surgical intervention and to continue current management. Blood cultures grew to bacterial see below for further details.  Hyponatremia: Likely  prerenal. Resolved with fluid hydration.  Essential hypertension: No changes made to his medication.  Obstructive sleep apnea: Continue C-pap  Unstageable multiple pressure injury  Wound care recommended hydrotherapy for 6 weeks with collagenase enzymatic debridement. air mattress Surgery has evaluate the wound recommended no further intervention.  HLD: Continue statins  Paraplegia  -SNF at discharge  -SW involved and patient in agreement    Obesity (BMI 30-39.9)  Bacteremia due to Streptococcus group A and Morganella: Has remained afebrile on Zosyn.  Repeat blood cultures have remained afebrile. D/w ID recommended to treat orally for 2 weeks. Continue Bactrim and Unasyn for 2 weeks. He was started on for steroid which she will take for 3 weeks.   Discharge Instructions  Discharge Instructions    Diet - low sodium heart healthy    Complete by:  As directed    Increase activity slowly    Complete by:  As directed      Allergies as of 01/01/2017      Reactions   Ivp Dye [iodinated Diagnostic Agents] Itching      Medication List    TAKE these medications   amoxicillin-clavulanate 875-125 MG tablet Commonly known as:  AUGMENTIN Take 1 tablet by mouth every 12 (twelve) hours.   collagenase ointment Commonly known as:  SANTYL Apply topically daily.   metoprolol succinate 50 MG 24 hr tablet Commonly known as:  TOPROL-XL Take 50 mg by mouth daily.   saccharomyces boulardii 250 MG capsule Commonly known as:  FLORASTOR Take 1 capsule (250 mg total) by mouth 2 (two) times daily.   simvastatin 20 MG tablet Commonly known as:  ZOCOR Take 20 mg by mouth at bedtime.   sulfamethoxazole-trimethoprim 800-160 MG tablet Commonly known as:  BACTRIM DS,SEPTRA  DS Take 1 tablet by mouth every 12 (twelve) hours.       Allergies  Allergen Reactions  . Ivp Dye [Iodinated Diagnostic Agents] Itching    Consultations:  Surgery Dr.  Excell Seltzer   Procedures/Studies: Dg Chest 1 View  Result Date: 12/28/2016 CLINICAL DATA:  Fall with pain.  Paraplegia.  Initial encounter. EXAM: CHEST 1 VIEW COMPARISON:  12/16/2016 and prior chest radiographs FINDINGS: The cardiomediastinal silhouette is unremarkable. There is no evidence of focal airspace disease, pulmonary edema, suspicious pulmonary nodule/mass, pleural effusion, or pneumothorax. No acute bony abnormalities are identified. IMPRESSION: No active disease. Electronically Signed   By: Margarette Canada M.D.   On: 12/28/2016 13:16   Dg Chest 2 View  Result Date: 12/16/2016 CLINICAL DATA:  Shortness of breath. EXAM: CHEST  2 VIEW COMPARISON:  Radiographs of November 09, 2016. FINDINGS: The heart size and mediastinal contours are within normal limits. Both lungs are clear. No pneumothorax or pleural effusion is noted. The visualized skeletal structures are unremarkable. IMPRESSION: No active cardiopulmonary disease. Electronically Signed   By: Marijo Conception, M.D.   On: 12/16/2016 16:16   Dg Pelvis 1-2 Views  Result Date: 12/28/2016 CLINICAL DATA:  Fall and injury to pelvis.  Initial encounter. EXAM: PELVIS - 1-2 VIEW COMPARISON:  06/15/2015 CT and prior studies FINDINGS: No acute fracture identified. Deformity of both hips again noted. No suspicious focal bony lesions identified. IMPRESSION: No acute abnormality. Electronically Signed   By: Margarette Canada M.D.   On: 12/28/2016 13:18   Dg Tibia/fibula Left  Result Date: 12/28/2016 CLINICAL DATA:  Fall with left lower leg injury.  Initial encounter. EXAM: LEFT TIBIA AND FIBULA - 2 VIEW COMPARISON:  05/30/2016 radiographs FINDINGS: No acute fracture. Chronic deformity within the ankle and foot noted. No acute abnormalities are identified. IMPRESSION: No acute abnormalities. Electronically Signed   By: Margarette Canada M.D.   On: 12/28/2016 13:21   Dg Foot Complete Left  Result Date: 12/28/2016 CLINICAL DATA:  Fall with left foot injury.  Initial  encounter. EXAM: LEFT FOOT - COMPLETE 3+ VIEW COMPARISON:  05/30/2016 FINDINGS: An equivocal nondisplaced fracture of the distal metatarsal on the lateral view noted. Soft tissue swelling is present. Chronic deformity within the ankle and foot again noted. IMPRESSION: Equivocal nondisplaced fracture of the distal metatarsal on the lateral view. Electronically Signed   By: Margarette Canada M.D.   On: 12/28/2016 13:24   Dg Femur Min 2 Views Left  Result Date: 12/28/2016 CLINICAL DATA:  Fall with left leg injury.  Initial encounter. EXAM: LEFT FEMUR 2 VIEWS COMPARISON:  None. FINDINGS: No acute fracture or dislocation identified. Chronic deformity in the hip and knee noted. No suspicious focal bony lesions identified. IMPRESSION: No acute abnormality. Electronically Signed   By: Margarette Canada M.D.   On: 12/28/2016 13:20      Subjective: No complains  Discharge Exam: Vitals:   12/31/16 2049 01/01/17 0415  BP: 101/67 91/66  Pulse: 75 70  Resp: 17 (!) 51  Temp: 98.7 F (37.1 C) 98.3 F (36.8 C)   Vitals:   12/31/16 1527 12/31/16 1740 12/31/16 2049 01/01/17 0415  BP: 97/60 99/66 101/67 91/66  Pulse: 69  75 70  Resp: 18  17 (!) 51  Temp: 98.6 F (37 C)  98.7 F (37.1 C) 98.3 F (36.8 C)  TempSrc: Oral  Oral Oral  SpO2: 96%  100% 97%  Weight:      Height:  General: Pt is alert, awake, not in acute distress Cardiovascular: RRR, S1/S2 +, no rubs, no gallops Respiratory: CTA bilaterally, no wheezing, no rhonchi Abdominal: Soft, NT, ND, bowel sounds + Extremities: no edema, no cyanosis    The results of significant diagnostics from this hospitalization (including imaging, microbiology, ancillary and laboratory) are listed below for reference.     Microbiology: Recent Results (from the past 240 hour(s))  Blood culture (routine x 2)     Status: Abnormal   Collection Time: 12/28/16 11:45 AM  Result Value Ref Range Status   Specimen Description BLOOD LEFT UPPER ARM  5 ML IN [facility]  BOTTLE  Final   Special Requests BOTTLES DRAWN AEROBIC AND ANAEROBIC 5CC  Final   Culture  Setup Time   Final    IN BOTH AEROBIC AND ANAEROBIC BOTTLES GRAM POSITIVE COCCI IN CHAINS CRITICAL RESULT CALLED TO, READ BACK BY AND VERIFIED WITH: TO EJACKSON(PHARMD) BY TCLEVELAND AT 12/29/16 AT 6:00AM Performed at [facility], [place] [address], [place], [place] [address]    Culture STREPTOCOCCUS GROUP G (A)  Final   Report Status 12/31/2016 FINAL  Final   Organism ID, Bacteria STREPTOCOCCUS GROUP G  Final      Susceptibility   Streptococcus group g - MIC*    CLINDAMYCIN <=0.25 SENSITIVE Sensitive     AMPICILLIN <=0.25 SENSITIVE Sensitive     ERYTHROMYCIN <=0.12 SENSITIVE Sensitive     VANCOMYCIN 0.5 SENSITIVE Sensitive     CEFTRIAXONE <=0.12 SENSITIVE Sensitive     LEVOFLOXACIN 1 SENSITIVE Sensitive     * STREPTOCOCCUS GROUP G  Blood Culture ID Panel (Reflexed)     Status: Abnormal   Collection Time: 12/28/16 11:45 AM  Result Value Ref Range Status   Enterococcus species NOT DETECTED NOT DETECTED Final   Listeria monocytogenes NOT DETECTED NOT DETECTED Final   Staphylococcus species NOT DETECTED NOT DETECTED Final   Staphylococcus aureus NOT DETECTED NOT DETECTED Final   Streptococcus species DETECTED (A) NOT DETECTED Final    Comment: Not Enterococcus species, Streptococcus agalactiae, Streptococcus pyogenes, or Streptococcus pneumoniae. CRITICAL RESULT CALLED TO, READ BACK BY AND VERIFIED WITH: TO EJACKSON(PHARMD) BY TCLEVELAND AT 12/29/2016 AT 6:00AM    Streptococcus agalactiae NOT DETECTED NOT DETECTED Final   Streptococcus pneumoniae NOT DETECTED NOT DETECTED Final   Streptococcus pyogenes NOT DETECTED NOT DETECTED Final   Acinetobacter baumannii NOT DETECTED NOT DETECTED Final   Enterobacteriaceae species NOT DETECTED NOT DETECTED Final   Enterobacter cloacae complex NOT DETECTED NOT DETECTED Final   Escherichia coli NOT DETECTED NOT DETECTED Final   Klebsiella oxytoca NOT  DETECTED NOT DETECTED Final   Klebsiella pneumoniae NOT DETECTED NOT DETECTED Final   Proteus species NOT DETECTED NOT DETECTED Final   Serratia marcescens NOT DETECTED NOT DETECTED Final   Haemophilus influenzae NOT DETECTED NOT DETECTED Final   Neisseria meningitidis NOT DETECTED NOT DETECTED Final   Pseudomonas aeruginosa NOT DETECTED NOT DETECTED Final   Candida albicans NOT DETECTED NOT DETECTED Final   Candida glabrata NOT DETECTED NOT DETECTED Final   Candida krusei NOT DETECTED NOT DETECTED Final   Candida parapsilosis NOT DETECTED NOT DETECTED Final   Candida tropicalis NOT DETECTED NOT DETECTED Final    Comment: Performed at [facility], [place] [address] [place], [place] [address]  Urine culture     Status: Abnormal   Collection Time: 12/28/16 12:29 PM  Result Value Ref Range Status   Specimen Description URINE, CLEAN CATCH  Final  Special Requests NONE  Final   Culture (A)  Final    30,000 COLONIES/mL CITROBACTER FREUNDII 30,000 COLONIES/mL PROTEUS MIRABILIS    Report Status 12/31/2016 FINAL  Final   Organism ID, Bacteria CITROBACTER FREUNDII (A)  Final   Organism ID, Bacteria PROTEUS MIRABILIS (A)  Final      Susceptibility   Citrobacter freundii - MIC*    CEFAZOLIN >=64 RESISTANT Resistant     CEFTRIAXONE <=1 SENSITIVE Sensitive     CIPROFLOXACIN <=0.25 SENSITIVE Sensitive     GENTAMICIN <=1 SENSITIVE Sensitive     IMIPENEM <=0.25 SENSITIVE Sensitive     NITROFURANTOIN 32 SENSITIVE Sensitive     TRIMETH/SULFA <=20 SENSITIVE Sensitive     PIP/TAZO <=4 SENSITIVE Sensitive     * 30,000 COLONIES/mL CITROBACTER FREUNDII   Proteus mirabilis - MIC*    AMPICILLIN <=2 SENSITIVE Sensitive     CEFAZOLIN <=4 SENSITIVE Sensitive     CEFTRIAXONE <=1 SENSITIVE Sensitive     CIPROFLOXACIN <=0.25 SENSITIVE Sensitive     GENTAMICIN <=1 SENSITIVE Sensitive     IMIPENEM 1 SENSITIVE Sensitive     NITROFURANTOIN 128 RESISTANT Resistant     TRIMETH/SULFA <=20 SENSITIVE  Sensitive     AMPICILLIN/SULBACTAM <=2 SENSITIVE Sensitive     PIP/TAZO <=4 SENSITIVE Sensitive     * 30,000 COLONIES/mL PROTEUS MIRABILIS  Blood culture (routine x 2)     Status: Abnormal   Collection Time: 12/28/16  1:00 PM  Result Value Ref Range Status   Specimen Description BLOOD RIGHT HAND  Final   Special Requests IN PEDIATRIC BOTTLE 2CC  Final   Culture  Setup Time   Final    GRAM NEGATIVE RODS IN PEDIATRIC BOTTLE CRITICAL RESULT CALLED TO, READ BACK BY AND VERIFIED WITH: T GREEN,PHARMD AT 1040 12/29/16 BY L BENFIELD Performed at [facility], [address], [place], [place] [address]    Culture [facility] MORGANII (A)  Final   Report Status 12/31/2016 FINAL  Final   Organism ID, Bacteria MORGANELLA MORGANII  Final      Susceptibility   Morganella morganii - MIC*    AMPICILLIN >=32 RESISTANT Resistant     CEFAZOLIN >=64 RESISTANT Resistant     CEFEPIME <=1 SENSITIVE Sensitive     CEFTAZIDIME <=1 SENSITIVE Sensitive     CEFTRIAXONE <=1 SENSITIVE Sensitive     CIPROFLOXACIN <=0.25 SENSITIVE Sensitive     GENTAMICIN <=1 SENSITIVE Sensitive     IMIPENEM 1 SENSITIVE Sensitive     TRIMETH/SULFA <=20 SENSITIVE Sensitive     AMPICILLIN/SULBACTAM 16 INTERMEDIATE Intermediate     PIP/TAZO <=4 SENSITIVE Sensitive     * MORGANELLA MORGANII  Blood Culture ID Panel (Reflexed)     Status: None   Collection Time: 12/28/16  1:00 PM  Result Value Ref Range Status   Enterococcus species NOT DETECTED NOT DETECTED Final   Listeria monocytogenes NOT DETECTED NOT DETECTED Final   Staphylococcus species NOT DETECTED NOT DETECTED Final   Staphylococcus aureus NOT DETECTED NOT DETECTED Final   Streptococcus species NOT DETECTED NOT DETECTED Final   Streptococcus agalactiae NOT DETECTED NOT DETECTED Final   Streptococcus pneumoniae NOT DETECTED NOT DETECTED Final   Streptococcus pyogenes NOT DETECTED NOT DETECTED Final   Acinetobacter baumannii NOT DETECTED NOT DETECTED Final    Enterobacteriaceae species NOT DETECTED NOT DETECTED Final   Enterobacter cloacae complex NOT DETECTED NOT DETECTED Final   Escherichia coli NOT DETECTED NOT DETECTED Final   Klebsiella oxytoca NOT DETECTED NOT DETECTED  Final   Klebsiella pneumoniae NOT DETECTED NOT DETECTED Final   Proteus species NOT DETECTED NOT DETECTED Final   Serratia marcescens NOT DETECTED NOT DETECTED Final   Haemophilus influenzae NOT DETECTED NOT DETECTED Final   Neisseria meningitidis NOT DETECTED NOT DETECTED Final   Pseudomonas aeruginosa NOT DETECTED NOT DETECTED Final   Candida albicans NOT DETECTED NOT DETECTED Final   Candida glabrata NOT DETECTED NOT DETECTED Final   Candida krusei NOT DETECTED NOT DETECTED Final   Candida parapsilosis NOT DETECTED NOT DETECTED Final   Candida tropicalis NOT DETECTED NOT DETECTED Final    Comment: Performed at [facility], [place] [address], [place], [place] [address]     Labs: BNP (last 3 results) No results for input(s): BNP in the last 8760 hours. Basic Metabolic Panel:  Recent Labs Lab 12/28/16 1147 12/29/16 0629 12/30/16 1051 12/31/16 0505  NA 151* 148* 145 144  K 4.1 3.0* 3.9 4.0  CL 119* 122* 120* 119*  CO2 22 21* 22 22  GLUCOSE 109* 100* 93 95  BUN 30* 22* 12 12  CREATININE 1.09 1.12 0.98 0.97  CALCIUM 9.2 8.2* 8.0* 8.1*   Liver Function Tests:  Recent Labs Lab 12/28/16 1147  AST 30  ALT 20  ALKPHOS 61  BILITOT 0.7  PROT 7.9  ALBUMIN 2.4*   No results for input(s): LIPASE, AMYLASE in the last 168 hours. No results for input(s): AMMONIA in the last 168 hours. CBC:  Recent Labs Lab 12/28/16 1147 12/29/16 0629 12/31/16 0505  WBC 6.3 4.1 6.3  NEUTROABS 4.9  --   --   HGB 12.3* 10.1* 9.4*  HCT 38.3* 33.0* 30.5*  MCV 86.5 85.5 86.9  PLT 167 113* 118*   Cardiac Enzymes: No results for input(s): CKTOTAL, CKMB, CKMBINDEX, TROPONINI in the last 168 hours. BNP: Invalid input(s): POCBNP CBG: No results for input(s): GLUCAP in  the last 168 hours. D-Dimer No results for input(s): DDIMER in the last 72 hours. Hgb A1c No results for input(s): HGBA1C in the last 72 hours. Lipid Profile No results for input(s): CHOL, HDL, LDLCALC, TRIG, CHOLHDL, LDLDIRECT in the last 72 hours. Thyroid function studies No results for input(s): TSH, T4TOTAL, T3FREE, THYROIDAB in the last 72 hours.  Invalid input(s): FREET3 Anemia work up No results for input(s): VITAMINB12, FOLATE, FERRITIN, TIBC, IRON, RETICCTPCT in the last 72 hours. Urinalysis    Component Value Date/Time   COLORURINE YELLOW 12/28/2016 1229   APPEARANCEUR HAZY (A) 12/28/2016 1229   LABSPEC 1.019 12/28/2016 1229   PHURINE 5.0 12/28/2016 1229   GLUCOSEU NEGATIVE 12/28/2016 1229   HGBUR SMALL (A) 12/28/2016 1229   BILIRUBINUR NEGATIVE 12/28/2016 1229   KETONESUR 5 (A) 12/28/2016 1229   PROTEINUR NEGATIVE 12/28/2016 1229   UROBILINOGEN 1.0 06/15/2015 1840   NITRITE NEGATIVE 12/28/2016 1229   LEUKOCYTESUR TRACE (A) 12/28/2016 1229   Sepsis Labs Invalid input(s): PROCALCITONIN,  WBC,  LACTICIDVEN Microbiology Recent Results (from the past 240 hour(s))  Blood culture (routine x 2)     Status: Abnormal   Collection Time: 12/28/16 11:45 AM  Result Value Ref Range Status   Specimen Description BLOOD LEFT UPPER ARM  5 ML IN [facility] BOTTLE  Final   Special Requests BOTTLES DRAWN AEROBIC AND ANAEROBIC 5CC  Final   Culture  Setup Time   Final    IN BOTH AEROBIC AND ANAEROBIC BOTTLES GRAM POSITIVE COCCI IN CHAINS CRITICAL RESULT CALLED TO, READ BACK BY AND VERIFIED WITH: TO EJACKSON(PHARMD) BY TCLEVELAND AT  12/29/16 AT 6:00AM Performed at [facility], [place] [address] [place], [place] [address]    Culture STREPTOCOCCUS GROUP G (A)  Final   Report Status 12/31/2016 FINAL  Final   Organism ID, Bacteria STREPTOCOCCUS GROUP G  Final      Susceptibility   Streptococcus group g - MIC*    CLINDAMYCIN <=0.25 SENSITIVE Sensitive     AMPICILLIN <=0.25 SENSITIVE  Sensitive     ERYTHROMYCIN <=0.12 SENSITIVE Sensitive     VANCOMYCIN 0.5 SENSITIVE Sensitive     CEFTRIAXONE <=0.12 SENSITIVE Sensitive     LEVOFLOXACIN 1 SENSITIVE Sensitive     * STREPTOCOCCUS GROUP G  Blood Culture ID Panel (Reflexed)     Status: Abnormal   Collection Time: 12/28/16 11:45 AM  Result Value Ref Range Status   Enterococcus species NOT DETECTED NOT DETECTED Final   Listeria monocytogenes NOT DETECTED NOT DETECTED Final   Staphylococcus species NOT DETECTED NOT DETECTED Final   Staphylococcus aureus NOT DETECTED NOT DETECTED Final   Streptococcus species DETECTED (A) NOT DETECTED Final    Comment: Not Enterococcus species, Streptococcus agalactiae, Streptococcus pyogenes, or Streptococcus pneumoniae. CRITICAL RESULT CALLED TO, READ BACK BY AND VERIFIED WITH: TO EJACKSON(PHARMD) BY TCLEVELAND AT 12/29/2016 AT 6:00AM    Streptococcus agalactiae NOT DETECTED NOT DETECTED Final   Streptococcus pneumoniae NOT DETECTED NOT DETECTED Final   Streptococcus pyogenes NOT DETECTED NOT DETECTED Final   Acinetobacter baumannii NOT DETECTED NOT DETECTED Final   Enterobacteriaceae species NOT DETECTED NOT DETECTED Final   Enterobacter cloacae complex NOT DETECTED NOT DETECTED Final   Escherichia coli NOT DETECTED NOT DETECTED Final   Klebsiella oxytoca NOT DETECTED NOT DETECTED Final   Klebsiella pneumoniae NOT DETECTED NOT DETECTED Final   Proteus species NOT DETECTED NOT DETECTED Final   Serratia marcescens NOT DETECTED NOT DETECTED Final   Haemophilus influenzae NOT DETECTED NOT DETECTED Final   Neisseria meningitidis NOT DETECTED NOT DETECTED Final   Pseudomonas aeruginosa NOT DETECTED NOT DETECTED Final   Candida albicans NOT DETECTED NOT DETECTED Final   Candida glabrata NOT DETECTED NOT DETECTED Final   Candida krusei NOT DETECTED NOT DETECTED Final   Candida parapsilosis NOT DETECTED NOT DETECTED Final   Candida tropicalis NOT DETECTED NOT DETECTED Final    Comment:  Performed at [facility], [place] [address] [place], [place] [address]  Urine culture     Status: Abnormal   Collection Time: 12/28/16 12:29 PM  Result Value Ref Range Status   Specimen Description URINE, CLEAN CATCH  Final   Special Requests NONE  Final   Culture (A)  Final    30,000 COLONIES/mL CITROBACTER FREUNDII 30,000 COLONIES/mL PROTEUS MIRABILIS    Report Status 12/31/2016 FINAL  Final   Organism ID, Bacteria CITROBACTER FREUNDII (A)  Final   Organism ID, Bacteria PROTEUS MIRABILIS (A)  Final      Susceptibility   Citrobacter freundii - MIC*    CEFAZOLIN >=64 RESISTANT Resistant     CEFTRIAXONE <=1 SENSITIVE Sensitive     CIPROFLOXACIN <=0.25 SENSITIVE Sensitive     GENTAMICIN <=1 SENSITIVE Sensitive     IMIPENEM <=0.25 SENSITIVE Sensitive     NITROFURANTOIN 32 SENSITIVE Sensitive     TRIMETH/SULFA <=20 SENSITIVE Sensitive     PIP/TAZO <=4 SENSITIVE Sensitive     * 30,000 COLONIES/mL CITROBACTER FREUNDII   Proteus mirabilis - MIC*    AMPICILLIN <=2 SENSITIVE Sensitive     CEFAZOLIN <=4 SENSITIVE Sensitive     CEFTRIAXONE <=  1 SENSITIVE Sensitive     CIPROFLOXACIN <=0.25 SENSITIVE Sensitive     GENTAMICIN <=1 SENSITIVE Sensitive     IMIPENEM 1 SENSITIVE Sensitive     NITROFURANTOIN 128 RESISTANT Resistant     TRIMETH/SULFA <=20 SENSITIVE Sensitive     AMPICILLIN/SULBACTAM <=2 SENSITIVE Sensitive     PIP/TAZO <=4 SENSITIVE Sensitive     * 30,000 COLONIES/mL PROTEUS MIRABILIS  Blood culture (routine x 2)     Status: Abnormal   Collection Time: 12/28/16  1:00 PM  Result Value Ref Range Status   Specimen Description BLOOD RIGHT HAND  Final   Special Requests IN PEDIATRIC BOTTLE 2CC  Final   Culture  Setup Time   Final    GRAM NEGATIVE RODS IN PEDIATRIC BOTTLE CRITICAL RESULT CALLED TO, READ BACK BY AND VERIFIED WITH: T GREEN,PHARMD AT 1040 12/29/16 BY L BENFIELD Performed at [facility], [place] [address] [place], [place] [address]    Culture [facility]  MORGANII (A)  Final   Report Status 12/31/2016 FINAL  Final   Organism ID, Bacteria MORGANELLA MORGANII  Final      Susceptibility   Morganella morganii - MIC*    AMPICILLIN >=32 RESISTANT Resistant     CEFAZOLIN >=64 RESISTANT Resistant     CEFEPIME <=1 SENSITIVE Sensitive     CEFTAZIDIME <=1 SENSITIVE Sensitive     CEFTRIAXONE <=1 SENSITIVE Sensitive     CIPROFLOXACIN <=0.25 SENSITIVE Sensitive     GENTAMICIN <=1 SENSITIVE Sensitive     IMIPENEM 1 SENSITIVE Sensitive     TRIMETH/SULFA <=20 SENSITIVE Sensitive     AMPICILLIN/SULBACTAM 16 INTERMEDIATE Intermediate     PIP/TAZO <=4 SENSITIVE Sensitive     * MORGANELLA MORGANII  Blood Culture ID Panel (Reflexed)     Status: None   Collection Time: 12/28/16  1:00 PM  Result Value Ref Range Status   Enterococcus species NOT DETECTED NOT DETECTED Final   Listeria monocytogenes NOT DETECTED NOT DETECTED Final   Staphylococcus species NOT DETECTED NOT DETECTED Final   Staphylococcus aureus NOT DETECTED NOT DETECTED Final   Streptococcus species NOT DETECTED NOT DETECTED Final   Streptococcus agalactiae NOT DETECTED NOT DETECTED Final   Streptococcus pneumoniae NOT DETECTED NOT DETECTED Final   Streptococcus pyogenes NOT DETECTED NOT DETECTED Final   Acinetobacter baumannii NOT DETECTED NOT DETECTED Final   Enterobacteriaceae species NOT DETECTED NOT DETECTED Final   Enterobacter cloacae complex NOT DETECTED NOT DETECTED Final   Escherichia coli NOT DETECTED NOT DETECTED Final   Klebsiella oxytoca NOT DETECTED NOT DETECTED Final   Klebsiella pneumoniae NOT DETECTED NOT DETECTED Final   Proteus species NOT DETECTED NOT DETECTED Final   Serratia marcescens NOT DETECTED NOT DETECTED Final   Haemophilus influenzae NOT DETECTED NOT DETECTED Final   Neisseria meningitidis NOT DETECTED NOT DETECTED Final   Pseudomonas aeruginosa NOT DETECTED NOT DETECTED Final   Candida albicans NOT DETECTED NOT DETECTED Final   Candida glabrata NOT DETECTED  NOT DETECTED Final   Candida krusei NOT DETECTED NOT DETECTED Final   Candida parapsilosis NOT DETECTED NOT DETECTED Final   Candida tropicalis NOT DETECTED NOT DETECTED Final    Comment: Performed at [facility], [place]. [address] [place], [place] [address]     Time coordinating discharge: Over 30 minutes  SIGNED:   Charlynne Cousins, MD  [facility] 01/01/2017, 8:16 AM Pager   If 7PM-7AM, please contact night-coverage [email or web address] Password [email or web address]

## 2017-01-05 LAB — CULTURE, BLOOD (ROUTINE X 2)
Culture: NO GROWTH
Culture: NO GROWTH

## 2018-11-27 IMAGING — CR DG CHEST 2V
4 series · 4 of 4 positions shown · non-contrast
Comparison: 10/30/2016, 01/05/2013 and earlier, including CT chest
04/06/2013.

CLINICAL DATA: 60-year-old with acute onset of lower extremity
weakness earlier today says that he was unable to get out of his
chair. Current history of sarcoidosis and hypertension. Former
smoker.

EXAM:
CHEST  2 VIEW

[w chest lat (1 of 2)]
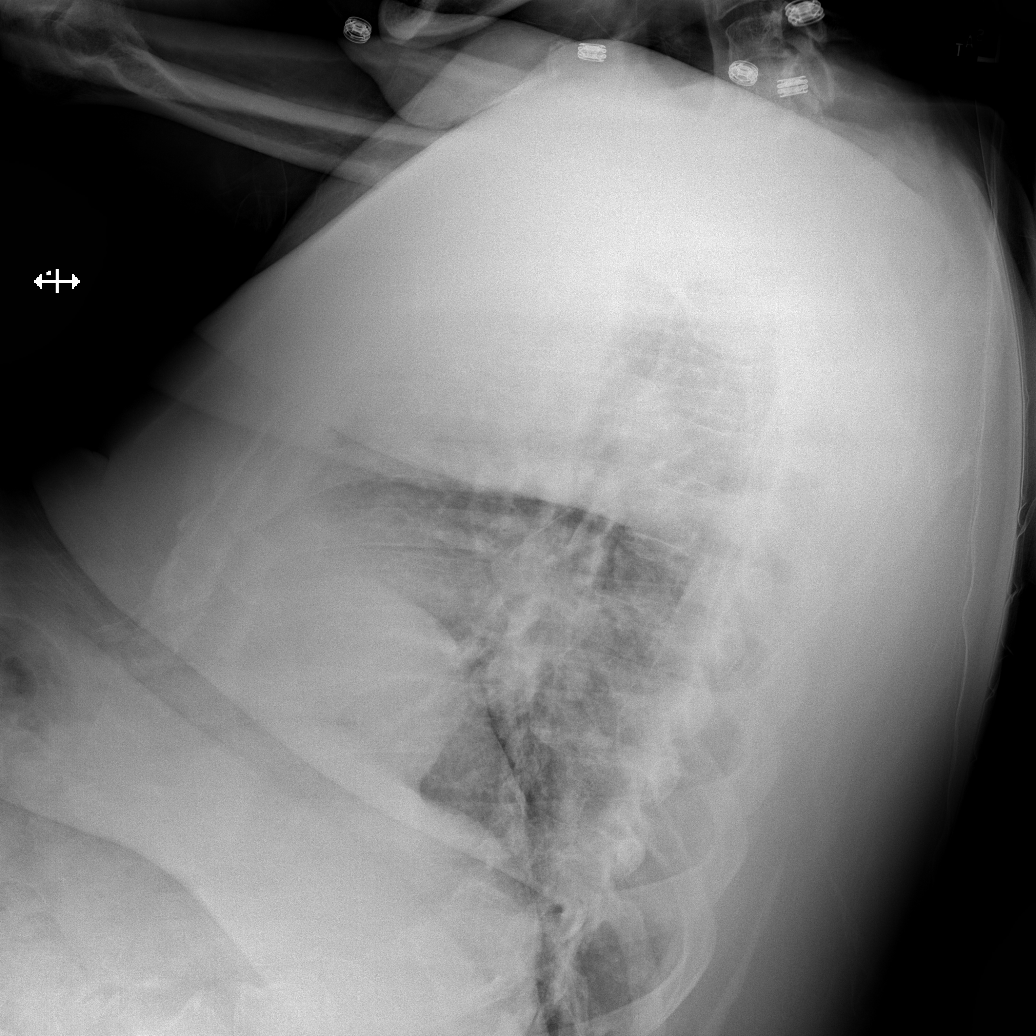

[w chest lat (2 of 2)]
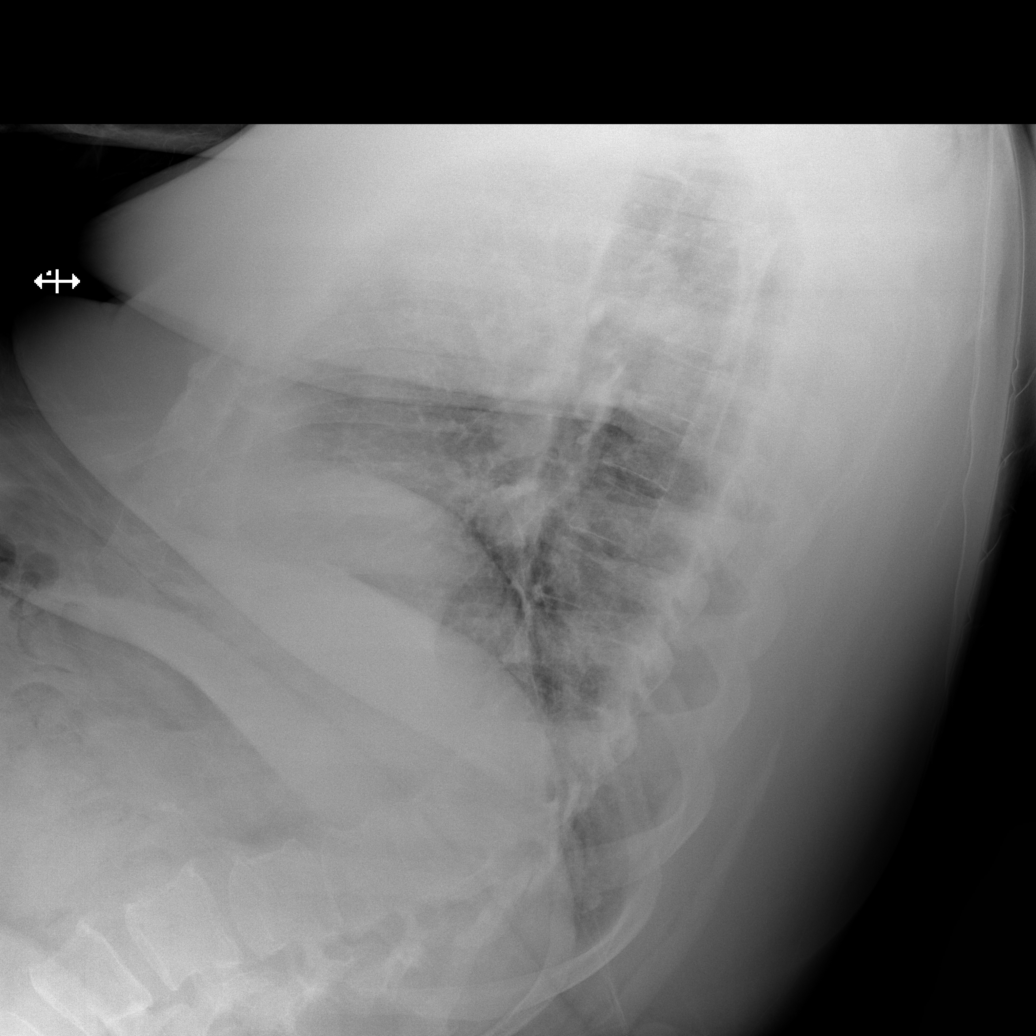

[x chest ap (1 of 2)]
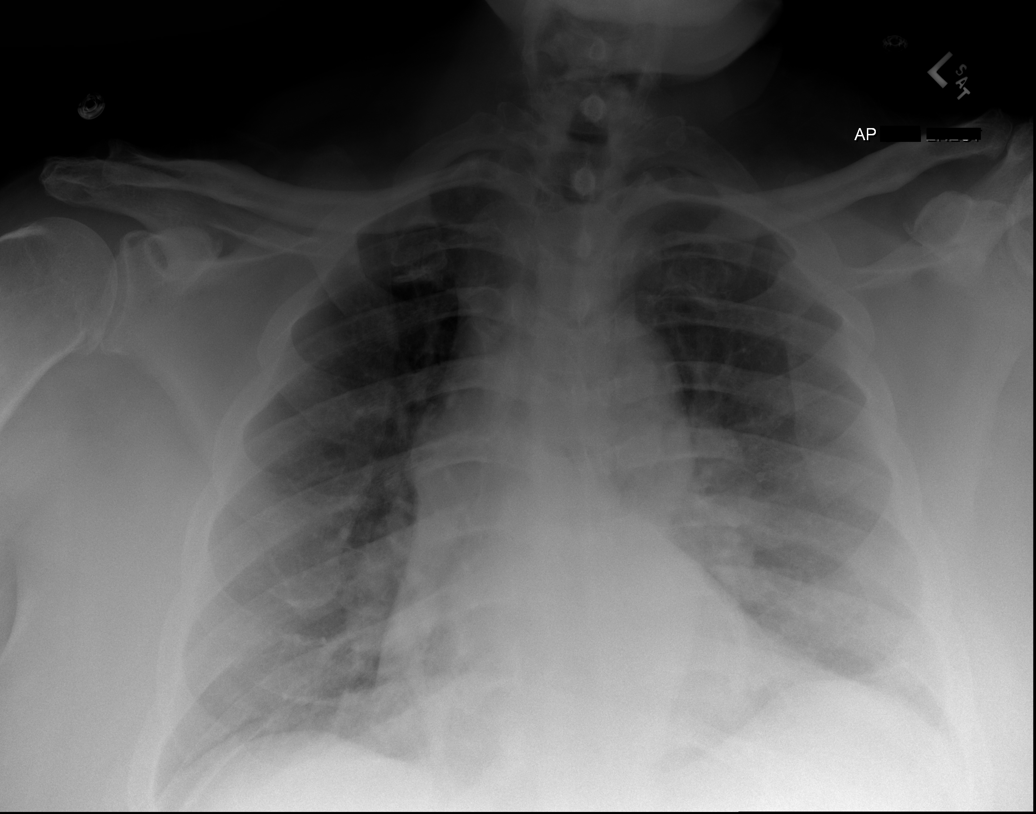

[x chest ap (2 of 2)]
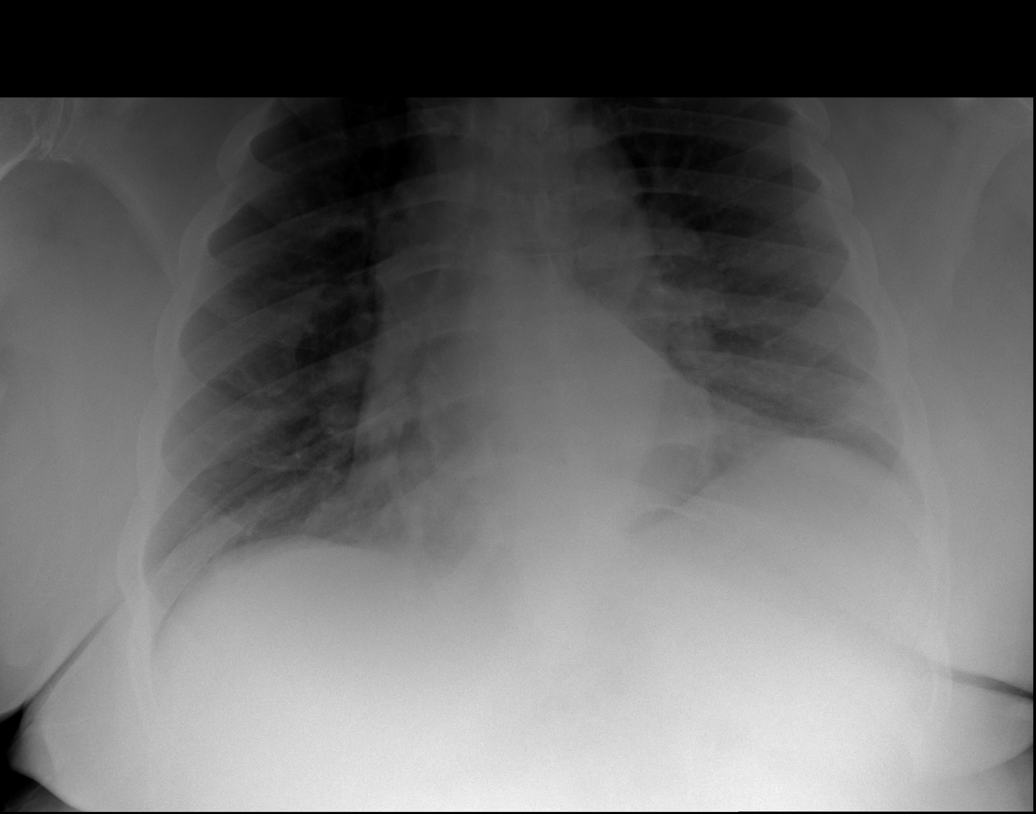

[4 of 4 positions shown; findings below may reference images not displayed]

FINDINGS: AP semi-erect and lateral images were obtained. Cardiac silhouette
normal in size for technique. Thoracic aorta mildly tortuous and
atherosclerotic, unchanged. Hilar and mediastinal contours otherwise
unremarkable. Eventration of the left anterior hemidiaphragm. Lungs
clear. Pulmonary vascularity normal. Bronchovascular markings
normal. No pleural effusions. Kyphosis deformity at the
thoracolumbar junction is felt to be positional and related to
flexion of the spine.
IMPRESSION: 1.  No acute cardiopulmonary disease.
2. Thoracic aortic atherosclerosis.

## 2022-02-01 DEATH — deceased
